# Patient Record
Sex: Male | Born: 1935 | Race: White | Hispanic: No | Smoking: Former smoker
Health system: Southern US, Community
[De-identification: ages and names within clinical notes are randomized; demographics above are authoritative.]

## PROBLEM LIST (undated history)

## (undated) DIAGNOSIS — I509 Heart failure, unspecified: Secondary | ICD-10-CM

## (undated) DIAGNOSIS — I4891 Unspecified atrial fibrillation: Secondary | ICD-10-CM

## (undated) DIAGNOSIS — I10 Essential (primary) hypertension: Secondary | ICD-10-CM

## (undated) DIAGNOSIS — R7303 Prediabetes: Secondary | ICD-10-CM

## (undated) DIAGNOSIS — I2510 Atherosclerotic heart disease of native coronary artery without angina pectoris: Secondary | ICD-10-CM

## (undated) DIAGNOSIS — R609 Edema, unspecified: Secondary | ICD-10-CM

## (undated) DIAGNOSIS — IMO0002 Reserved for concepts with insufficient information to code with codable children: Secondary | ICD-10-CM

## (undated) DIAGNOSIS — I4510 Unspecified right bundle-branch block: Secondary | ICD-10-CM

## (undated) HISTORY — DX: Essential (primary) hypertension: I10

## (undated) HISTORY — DX: Edema, unspecified: R60.9

## (undated) HISTORY — DX: Unspecified right bundle-branch block: I45.10

## (undated) HISTORY — PX: NEPHRECTOMY: SHX65

## (undated) HISTORY — DX: Unspecified atrial fibrillation: I48.91

## (undated) HISTORY — PX: CORONARY ARTERY BYPASS GRAFT: SHX141

## (undated) HISTORY — DX: Heart failure, unspecified: I50.9

## (undated) HISTORY — PX: MELANOMA EXCISION: SHX5266

## (undated) HISTORY — DX: Atherosclerotic heart disease of native coronary artery without angina pectoris: I25.10

## (undated) HISTORY — PX: HERNIA REPAIR: SHX51

## (undated) HISTORY — PX: SQUAMOUS CELL CARCINOMA EXCISION: SHX2433

## (undated) HISTORY — PX: APPENDECTOMY: SHX54

## (undated) HISTORY — PX: PROSTATE ABLATION: SHX6042

## (undated) HISTORY — DX: Reserved for concepts with insufficient information to code with codable children: IMO0002

---

## 2000-12-21 ENCOUNTER — Encounter (INDEPENDENT_AMBULATORY_CARE_PROVIDER_SITE_OTHER): Payer: Self-pay | Admitting: *Deleted

## 2000-12-21 ENCOUNTER — Other Ambulatory Visit: Admission: RE | Admit: 2000-12-21 | Discharge: 2000-12-21 | Payer: Self-pay | Admitting: Internal Medicine

## 2004-07-08 ENCOUNTER — Ambulatory Visit: Payer: Self-pay | Admitting: Urology

## 2005-02-08 ENCOUNTER — Ambulatory Visit: Payer: Self-pay

## 2005-03-08 ENCOUNTER — Ambulatory Visit: Payer: Self-pay | Admitting: Cardiology

## 2005-07-11 ENCOUNTER — Ambulatory Visit: Payer: Self-pay | Admitting: Urology

## 2006-04-19 ENCOUNTER — Ambulatory Visit: Payer: Self-pay | Admitting: Cardiology

## 2006-05-02 ENCOUNTER — Ambulatory Visit: Payer: Self-pay | Admitting: Internal Medicine

## 2006-06-05 LAB — HM COLONOSCOPY

## 2006-06-06 ENCOUNTER — Ambulatory Visit: Payer: Self-pay | Admitting: Internal Medicine

## 2006-10-04 ENCOUNTER — Ambulatory Visit: Payer: Self-pay

## 2007-04-17 ENCOUNTER — Ambulatory Visit: Payer: Self-pay | Admitting: Cardiology

## 2007-04-29 ENCOUNTER — Ambulatory Visit: Payer: Self-pay

## 2007-09-30 ENCOUNTER — Ambulatory Visit: Payer: Self-pay | Admitting: Urology

## 2008-04-21 ENCOUNTER — Ambulatory Visit: Payer: Self-pay | Admitting: Cardiology

## 2008-07-06 ENCOUNTER — Ambulatory Visit: Payer: Self-pay | Admitting: Family Medicine

## 2008-07-14 ENCOUNTER — Ambulatory Visit: Payer: Self-pay | Admitting: Family Medicine

## 2008-07-21 ENCOUNTER — Ambulatory Visit: Payer: Self-pay | Admitting: Cardiology

## 2008-07-21 LAB — CONVERTED CEMR LAB
Calcium: 9.6 mg/dL (ref 8.4–10.5)
Creatinine, Ser: 1.14 mg/dL (ref 0.40–1.50)
MCHC: 32.8 g/dL (ref 30.0–36.0)
MCV: 84.4 fL (ref 78.0–100.0)
Platelets: 190 10*3/uL (ref 150–400)
Pro B Natriuretic peptide (BNP): 288.2 pg/mL — ABNORMAL HIGH (ref 0.0–100.0)
RDW: 13.1 % (ref 11.5–15.5)
Sodium: 139 meq/L (ref 135–145)

## 2008-07-24 ENCOUNTER — Ambulatory Visit: Payer: Self-pay | Admitting: Cardiology

## 2008-07-28 ENCOUNTER — Encounter: Payer: Self-pay | Admitting: Cardiology

## 2008-07-28 ENCOUNTER — Ambulatory Visit: Payer: Self-pay | Admitting: Internal Medicine

## 2008-07-28 LAB — CONVERTED CEMR LAB: INR: 1.9 — ABNORMAL HIGH (ref 0.0–1.5)

## 2008-08-04 ENCOUNTER — Ambulatory Visit: Payer: Self-pay

## 2008-08-04 ENCOUNTER — Encounter: Payer: Self-pay | Admitting: Cardiology

## 2008-08-12 ENCOUNTER — Encounter: Payer: Self-pay | Admitting: Cardiology

## 2008-08-12 ENCOUNTER — Ambulatory Visit: Payer: Self-pay | Admitting: Internal Medicine

## 2008-08-12 LAB — CONVERTED CEMR LAB: Prothrombin Time: 45.9 s — ABNORMAL HIGH (ref 11.6–15.2)

## 2008-08-17 ENCOUNTER — Ambulatory Visit: Payer: Self-pay | Admitting: Family Medicine

## 2008-08-21 ENCOUNTER — Encounter: Payer: Self-pay | Admitting: Cardiology

## 2008-08-21 ENCOUNTER — Ambulatory Visit: Payer: Self-pay | Admitting: Internal Medicine

## 2008-08-27 ENCOUNTER — Ambulatory Visit: Payer: Self-pay | Admitting: Cardiology

## 2008-08-27 LAB — CONVERTED CEMR LAB: Prothrombin Time: 34.6 s — ABNORMAL HIGH (ref 11.6–15.2)

## 2008-09-03 ENCOUNTER — Ambulatory Visit: Payer: Self-pay | Admitting: Cardiology

## 2008-09-17 ENCOUNTER — Encounter: Payer: Self-pay | Admitting: Cardiology

## 2008-09-17 ENCOUNTER — Ambulatory Visit: Payer: Self-pay | Admitting: Internal Medicine

## 2008-09-17 LAB — CONVERTED CEMR LAB
INR: 2.8 — ABNORMAL HIGH (ref 0.0–1.5)
Prothrombin Time: 31.9 s — ABNORMAL HIGH (ref 11.6–15.2)

## 2008-09-21 ENCOUNTER — Ambulatory Visit: Payer: Self-pay | Admitting: Cardiology

## 2008-09-21 ENCOUNTER — Ambulatory Visit (HOSPITAL_COMMUNITY): Admission: RE | Admit: 2008-09-21 | Discharge: 2008-09-21 | Payer: Self-pay | Admitting: Cardiology

## 2008-09-21 HISTORY — PX: CARDIOVERSION: SHX1299

## 2008-09-29 ENCOUNTER — Ambulatory Visit: Payer: Self-pay | Admitting: Cardiology

## 2008-10-07 ENCOUNTER — Encounter: Payer: Self-pay | Admitting: Cardiology

## 2008-10-07 ENCOUNTER — Ambulatory Visit: Payer: Self-pay | Admitting: Cardiology

## 2008-10-07 LAB — CONVERTED CEMR LAB: Prothrombin Time: 32.5 s — ABNORMAL HIGH (ref 11.6–15.2)

## 2008-11-05 ENCOUNTER — Ambulatory Visit: Payer: Self-pay | Admitting: Cardiology

## 2008-11-10 ENCOUNTER — Ambulatory Visit: Payer: Self-pay | Admitting: Cardiology

## 2008-11-24 ENCOUNTER — Ambulatory Visit: Payer: Self-pay | Admitting: Cardiology

## 2008-12-01 ENCOUNTER — Ambulatory Visit: Payer: Self-pay | Admitting: Cardiology

## 2008-12-29 ENCOUNTER — Ambulatory Visit: Payer: Self-pay | Admitting: Cardiology

## 2008-12-29 DIAGNOSIS — R609 Edema, unspecified: Secondary | ICD-10-CM

## 2008-12-29 DIAGNOSIS — I4510 Unspecified right bundle-branch block: Secondary | ICD-10-CM

## 2009-01-12 ENCOUNTER — Ambulatory Visit: Payer: Self-pay | Admitting: Cardiology

## 2009-01-25 ENCOUNTER — Telehealth: Payer: Self-pay | Admitting: Cardiology

## 2009-02-02 ENCOUNTER — Ambulatory Visit: Payer: Self-pay | Admitting: Cardiology

## 2009-02-12 ENCOUNTER — Encounter: Payer: Self-pay | Admitting: Cardiology

## 2009-02-23 ENCOUNTER — Ambulatory Visit: Payer: Self-pay | Admitting: Cardiology

## 2009-03-09 ENCOUNTER — Ambulatory Visit: Payer: Self-pay | Admitting: Cardiology

## 2009-03-09 DIAGNOSIS — I10 Essential (primary) hypertension: Secondary | ICD-10-CM

## 2009-03-10 ENCOUNTER — Encounter: Payer: Self-pay | Admitting: Cardiology

## 2009-03-12 ENCOUNTER — Telehealth: Payer: Self-pay | Admitting: Cardiology

## 2009-03-22 ENCOUNTER — Encounter: Payer: Self-pay | Admitting: *Deleted

## 2009-03-23 ENCOUNTER — Ambulatory Visit: Payer: Self-pay | Admitting: Cardiology

## 2009-03-23 LAB — CONVERTED CEMR LAB: POC INR: 1.7

## 2009-04-06 ENCOUNTER — Ambulatory Visit: Payer: Self-pay | Admitting: Cardiology

## 2009-04-06 LAB — CONVERTED CEMR LAB: POC INR: 2.9

## 2009-04-21 ENCOUNTER — Ambulatory Visit: Payer: Self-pay | Admitting: Urology

## 2009-05-04 ENCOUNTER — Ambulatory Visit: Payer: Self-pay | Admitting: Cardiovascular Disease

## 2009-05-04 LAB — CONVERTED CEMR LAB: POC INR: 3.6

## 2009-05-25 ENCOUNTER — Ambulatory Visit: Payer: Self-pay | Admitting: Cardiovascular Disease

## 2009-06-15 ENCOUNTER — Ambulatory Visit: Payer: Self-pay | Admitting: Cardiovascular Disease

## 2009-06-15 LAB — CONVERTED CEMR LAB: POC INR: 2.7

## 2009-07-13 ENCOUNTER — Ambulatory Visit: Payer: Self-pay | Admitting: Cardiology

## 2009-07-13 LAB — CONVERTED CEMR LAB: POC INR: 2.6

## 2009-08-10 ENCOUNTER — Ambulatory Visit: Payer: Self-pay | Admitting: Cardiology

## 2009-09-07 ENCOUNTER — Ambulatory Visit: Payer: Self-pay | Admitting: Cardiovascular Disease

## 2009-09-07 LAB — CONVERTED CEMR LAB

## 2009-10-05 ENCOUNTER — Ambulatory Visit: Payer: Self-pay | Admitting: Cardiovascular Disease

## 2009-10-22 DIAGNOSIS — I25118 Atherosclerotic heart disease of native coronary artery with other forms of angina pectoris: Secondary | ICD-10-CM | POA: Insufficient documentation

## 2009-11-01 ENCOUNTER — Ambulatory Visit: Payer: Self-pay | Admitting: Cardiology

## 2009-11-01 DIAGNOSIS — R0602 Shortness of breath: Secondary | ICD-10-CM | POA: Insufficient documentation

## 2009-11-10 ENCOUNTER — Telehealth (INDEPENDENT_AMBULATORY_CARE_PROVIDER_SITE_OTHER): Payer: Self-pay | Admitting: *Deleted

## 2009-11-11 ENCOUNTER — Encounter (HOSPITAL_COMMUNITY): Admission: RE | Admit: 2009-11-11 | Discharge: 2010-01-05 | Payer: Self-pay | Admitting: Cardiology

## 2009-11-11 ENCOUNTER — Ambulatory Visit: Payer: Self-pay

## 2009-11-11 ENCOUNTER — Ambulatory Visit: Payer: Self-pay | Admitting: Cardiology

## 2009-11-17 ENCOUNTER — Telehealth: Payer: Self-pay | Admitting: Cardiology

## 2009-11-30 ENCOUNTER — Ambulatory Visit: Payer: Self-pay | Admitting: Internal Medicine

## 2009-11-30 LAB — CONVERTED CEMR LAB: POC INR: 2.4

## 2009-12-28 ENCOUNTER — Ambulatory Visit: Payer: Self-pay | Admitting: Cardiovascular Disease

## 2009-12-28 LAB — CONVERTED CEMR LAB: POC INR: 2.4

## 2010-01-25 ENCOUNTER — Ambulatory Visit: Payer: Self-pay | Admitting: Cardiovascular Disease

## 2010-01-25 LAB — CONVERTED CEMR LAB: POC INR: 2.9

## 2010-02-23 ENCOUNTER — Ambulatory Visit: Payer: Self-pay | Admitting: Cardiovascular Disease

## 2010-02-23 LAB — CONVERTED CEMR LAB: POC INR: 2.4

## 2010-03-30 ENCOUNTER — Ambulatory Visit: Payer: Self-pay | Admitting: Cardiovascular Disease

## 2010-04-28 ENCOUNTER — Ambulatory Visit: Payer: Self-pay | Admitting: Cardiology

## 2010-05-26 ENCOUNTER — Ambulatory Visit: Payer: Self-pay | Admitting: Cardiology

## 2010-05-26 LAB — CONVERTED CEMR LAB: POC INR: 2.3

## 2010-06-22 ENCOUNTER — Ambulatory Visit: Payer: Self-pay | Admitting: Cardiovascular Disease

## 2010-07-20 ENCOUNTER — Ambulatory Visit: Payer: Self-pay | Admitting: Cardiovascular Disease

## 2010-08-18 ENCOUNTER — Ambulatory Visit: Admission: RE | Admit: 2010-08-18 | Discharge: 2010-08-18 | Payer: Self-pay | Source: Home / Self Care

## 2010-08-18 LAB — CONVERTED CEMR LAB: POC INR: 2.6

## 2010-09-04 LAB — CONVERTED CEMR LAB
ALT: 23 units/L
BUN: 16 mg/dL
CO2: 23 meq/L
Calcium: 9 mg/dL
Cholesterol: 125 mg/dL
Creatinine, Ser: 1.22 mg/dL
HDL: 55 mg/dL

## 2010-09-06 ENCOUNTER — Encounter: Payer: Self-pay | Admitting: Cardiology

## 2010-09-06 NOTE — Medication Information (Signed)
Summary: rov/ewj  Anticoagulant Therapy  Managed by: Cloyde Reams, RN, BSN Referring MD: Valera Castle PCP: Julieanne Manson, MD Supervising MD: Mariah Milling Indication 1: Atrial Fibrillation (ICD-427.31) Lab Used: [facility] Site: [place] INR POC 2.8 INR RANGE 2.0-3.0  Dietary changes: yes       Details: Decr amt of vit K  Health status changes: no    Bleeding/hemorrhagic complications: no    Recent/future hospitalizations: no    Any changes in medication regimen? no    Recent/future dental: no  Any missed doses?: no       Is patient compliant with meds? yes       Allergies: No Known Drug Allergies  Anticoagulation Management History:      The patient is taking warfarin and comes in today for a routine follow up visit.  Positive risk factors for bleeding include an age of 75 years or older.  The bleeding index is 'intermediate risk'.  Positive CHADS2 values include History of HTN and Age > 68 years old.  The start date was 07/21/2008.  His last INR was 2.9.  Anticoagulation responsible Nakesha Ebrahim: [facility].  INR POC: 2.8.  Cuvette Lot#: [id number].  Exp: 05/2011.    Anticoagulation Management Assessment/Plan:      The patient's current anticoagulation dose is Warfarin sodium 5 mg tabs: Use as directed by Anticoagulation Clinic.  The target INR is 2 - 3.  The next INR is due 04/28/2010.  Anticoagulation instructions were given to patient.  Results were reviewed/authorized by Cloyde Reams, RN, BSN.  He was notified by Cloyde Reams RN.         Prior Anticoagulation Instructions: INR 2.4  Continue on same dosage 2.5mg  daily except 5mg  on Mondays and Fridays.  Recheck in 4 weeks.    Current Anticoagulation Instructions: INR 2.8  Continue on same dosage 1/2 tablet daily except 1 tablet on Mondays and Fridays.  Recheck in 4 weeks.   Appt with Dr Daleen Squibb on 04/28/10 at 3:30pm.

## 2010-09-06 NOTE — Medication Information (Signed)
Summary: CCR/AMD  Anticoagulant Therapy  Managed by: Cloyde Reams, RN, BSN Referring MD: Valera Castle PCP: Julieanne Manson, MD Supervising MD: Mariah Milling Indication 1: Atrial Fibrillation (ICD-427.31) Lab Used: [facility]--[place] Site:  INR POC 2.9 INR RANGE 2.0-3.0  Dietary changes: no    Health status changes: no    Bleeding/hemorrhagic complications: no    Recent/future hospitalizations: no    Any changes in medication regimen? no    Recent/future dental: no  Any missed doses?: no       Is patient compliant with meds? yes       Allergies: No Known Drug Allergies  Anticoagulation Management History:      The patient is taking warfarin and comes in today for a routine follow up visit.  Positive risk factors for bleeding include an age of 75 years or older.  The bleeding index is 'intermediate risk'.  Positive CHADS2 values include History of HTN and Age > 75 years old.  The start date was 07/21/2008.  His last INR was 2.9.  Anticoagulation responsible provider: Goll[facility].  INR POC: 2.9.  Cuvette Lot#: [id number].  Exp: 04/2011.    Anticoagulation Management Assessment/Plan:      The patient's current anticoagulation dose is Warfarin sodium 5 mg tabs: Use as directed by Anticoagulation Clinic.  The target INR is 2 - 3.  The next INR is due 02/22/2010.  Anticoagulation instructions were given to patient.  Results were reviewed/authorized by Cloyde Reams, RN, BSN.  He was notified by Cloyde Reams RN.         Prior Anticoagulation Instructions: INR 2.4  Continue on same dosage 2.5mg  daily except 5mg  on Mondays and Fridays.  Recheck in 4 weeks.    Current Anticoagulation Instructions: INR 2.9  Continue on same dosage 2.5mg  daily except 5mg  on Mondays and Fridays.  Recheck in 4 weeks.

## 2010-09-06 NOTE — Assessment & Plan Note (Signed)
Summary: Cardiology Nuclear Study  Nuclear Med Background Indications for Stress Test: Evaluation for Ischemia, Graft Patency   History: CABG, Cardioversion, Echo, Myocardial Perfusion Study  History Comments: '98 CABG x 3 9/08 MPS: EF=72%, NL, V-ectopy '09 Echo: EF=60%, mod. LVH 2/10 Cardioversion: A. Fib > S. Brady  Symptoms: DOE, Fatigue    Nuclear Pre-Procedure Cardiac Risk Factors: Family History - CAD, Hypertension, Lipids, RBBB Caffeine/Decaff Intake: None NPO After: 7:30 AM Lungs: clear IV 0.9% NS with Angio Cath: 18g     IV Site: (R) AC IV Started by: Stanton Kidney EMT-P Chest Size (in) 44     Height (in): 68 Weight (lb): 194 BMI: 29.60 Tech Comments: Metoprolol last taken at approx. 9:30 last evening, per Pt.  Nuclear Med Study 1 or 2 day study:  1 day     Stress Test Type:  Eugenie Birks Reading MD:  Marca Ancona, MD     Referring MD:  Valera Castle Resting Radionuclide:  Technetium 21m Tetrofosmin     Resting Radionuclide Dose:  11 mCi  Stress Radionuclide:  Technetium 55m Tetrofosmin     Stress Radionuclide Dose:  33 mCi   Stress Protocol   Lexiscan: 0.4 mg   Stress Test Technologist:  Milana Na EMT-P     Nuclear Technologist:  Domenic Polite CNMT  Rest Procedure  Myocardial perfusion imaging was performed at rest 45 minutes following the intravenous administration of Myoview Technetium 23m Tetrofosmin.  Stress Procedure  The patient received IV Lexiscan 0.4 mg over 15-seconds.  Myoview injected at 30-seconds.  There were no significant changes and rare pvcs with infusion.  Quantitative spect images were obtained after a 45 minute delay.  QPS Raw Data Images:  Normal; no motion artifact; normal heart/lung ratio. Stress Images:  Mild mid to apical anterior perfusion defect.  Rest Images:  Mild mid to apical anterior perfusion defect.  Subtraction (SDS):  Fixed mild mid to apical anterior perfusion defect.  Transient Ischemic Dilatation:  1.05  (Normal  <1.22)  Lung/Heart Ratio:  .35  (Normal <0.45)  Quantitative Gated Spect Images QGS EDV:  104 ml QGS ESV:  40 ml QGS EF:  62 % QGS cine images:  Normal wall motion   Overall Impression  Exercise Capacity: Lexiscan study.  BP Response: Normal blood pressure response. Clinical Symptoms: No chest pain ECG Impression: Baseline atrial fibrillation with RBBB.  Overall Impression: Fixed mild mid to apical anterior perfusion defect.  This may represent a small area of infarction.  There is no evidence for ischemia.  Low risk study.   Appended Document: Cardiology Nuclear Study EXCELLENT, NO CHANGE IN TREATMENT  Appended Document: Cardiology Nuclear Study LMTCB./CY  Appended Document: Cardiology Nuclear Study LMTCB./CY  Appended Document: Cardiology Nuclear Study Pt. aware of stress test results.

## 2010-09-06 NOTE — Assessment & Plan Note (Signed)
Summary: 6 mo f/u      Allergies Added: NKDA  Visit Type:  6 mo f/u Primary Provider:  Julieanne Manson, MD  CC:  no cardiac complaints today.  History of Present Illness: [name] comes in today for followup of his coronary artery disease, chronic atrial fibrillation, anticoagulation, and hypertension.  Other than some lower extremity edema he has had no complaints. He denies palpitations, dyspnea on exertion, problems with bleeding or melena, chest pain or angina.  He had a stress Myoview back in April which was stable. He has good LV function  Clinical Reports Reviewed:  Nuclear Study:  11/11/2009:  Excerise capacity: Lexiscan study  Blood Pressure response: Normal blood pressure response  Clinical symptoms: No chest pain  ECG impression: Baseline atrial fibrillation with RBBB  Overall impression: Fixed mild mid to apical anterior perfusion defect. This may represent a small area of infarction. There is no evidence for ischemia. Low risk study.  Marca Ancona, MD   04/29/2007:  Exercise capacity - Fair exercise capacity  Blood Pressure - hypertensive BP response  Clinical Symptoms - dyspnea  ECG impression - RBBB non-specific ST/T wave changes. Short burst of ? flutter or PAT  Overall impression -  Normal stress nuclear study  02/08/2005:  Walking on the treadmill, the patient had no chest pain. There were no diagnostic ST changes. There was some ventricular ectopy, as described, with the patient off his beta-blocker. The beta-blocker was given immediately afterwards. The nuclear imagesreveal no sign of significant scar or ischemia. There is good wall motion and a normal ejection fraction.   Current Medications (verified): 1)  Warfarin Sodium 5 Mg Tabs (Warfarin Sodium) .... Use As Directed By Anticoagulation Clinic 2)  Furosemide 40 Mg Tabs (Furosemide) .... Take One Tablet By Mouth Daily. 3)  Accupril 40 Mg Tabs (Quinapril Hcl) .Marland Kitchen.. 1 By Mouth Once Daily 4)   Metoprolol Succinate 100 Mg Xr24h-Tab (Metoprolol Succinate) .... Take One Tablet By Mouth Daily 5)  Glucosamine-Chondroitin   Caps (Glucosamine-Chondroit-Vit C-Mn) .... Once Daily 6)  Fish Oil   Oil (Fish Oil) .... 1000mg  Once Daily 7)  Aspirin 81 Mg Tbec (Aspirin) .... Take One Tablet By Mouth Daily 8)  Flomax 0.4 Mg Caps (Tamsulosin Hcl) .... Once Daily 9)  Amlodipine Besylate 5 Mg Tabs (Amlodipine Besylate) .... Take One Tablet By Mouth Daily 10)  Simvastatin 10 Mg Tabs (Simvastatin) .... Take One Tablet By Mouth Daily At Bedtime 11)  Multivitamins   Tabs (Multiple Vitamin) .... Once Daily 12)  Vitamin C 500 Mg  Tabs (Ascorbic Acid) .... Once Daily  Allergies (verified): No Known Drug Allergies  Past History:  Past Medical History: Last updated: 10/22/2009 ATRIAL FIBRILLATION (ICD-427.31) COUMADIN THERAPY (ICD-V58.61) CAD, NATIVE VESSEL (ICD-414.01) HYPERTENSION, BENIGN (ICD-401.1) RBBB (ICD-426.4) EDEMA (ICD-782.3)    Past Surgical History: Last updated: 10/22/2009 cardioversion..09/21/08 Appendectomy  Family History: Last updated: 10/22/2009 Negative for gastrointestinal malignancy  Social History: Last updated: 10/22/2009 The patient is married with 3 children.  He lives with his  wife.  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  He does not smoke.  He  enjoys a couple of alcoholic beverages per day.  Risk Factors: Smoking Status: never (12/29/2008)  Review of Systems       negative other than history of present illness  Vital Signs:  Patient profile:   77 year old male Height:      68 inches Weight:      197.4 pounds BMI:  30.12 Pulse rate:   70 / minute Pulse rhythm:   irregular BP sitting:   138 / 62  (left arm) Cuff size:   large  Vitals Entered By: Danielle Rankin, CMA (April 28, 2010 4:12 PM)  Physical Exam  General:  Well developed, well nourished, in no acute distress. Head:  normocephalic and atraumatic Eyes:  PERRLA/EOM intact;  conjunctiva and lids normal. Neck:  Neck supple, no JVD. No masses, thyromegaly or abnormal cervical nodes. Chest Wall:  no deformities or breast masses noted Lungs:  Clear bilaterally to auscultation and percussion. Heart:  PMI nondisplaced, regular rate and rhythm, verbal S1-S2, carotid upstrokes equal bilaterally without bruit Abdomen:  Bowel sounds positive; abdomen soft and non-tender without masses, organomegaly, or hernias noted. No hepatosplenomegaly. Msk:  decreased ROM.   Pulses:  pulses normal in all 4 extremities Extremities:  1+ left pedal edema and 1+ right pedal edema.  chronic edema skin change Neurologic:  Alert and oriented x 3. Skin:  Intact without lesions or rashes. Psych:  Normal affect.   EKG  Procedure date:  04/28/2010  Findings:      atrial fibrillation,right bundle branch block, no change.  Impression & Recommendations:  Problem # 1:  ATRIAL FIBRILLATION (ICD-427.31) Assessment Unchanged  His updated medication list for this problem includes:    Warfarin Sodium 5 Mg Tabs (Warfarin sodium) ..... Use as directed by anticoagulation clinic    Metoprolol Succinate 100 Mg Xr24h-tab (Metoprolol succinate) .Marland Kitchen... Take one tablet by mouth daily    Aspirin 81 Mg Tbec (Aspirin) .Marland Kitchen... Take one tablet by mouth daily  Problem # 2:  COUMADIN THERAPY (ICD-V58.61) Assessment: Unchanged  Problem # 3:  CAD, NATIVE VESSEL (ICD-414.01) Assessment: Unchanged  His updated medication list for this problem includes:    Warfarin Sodium 5 Mg Tabs (Warfarin sodium) ..... Use as directed by anticoagulation clinic    Accupril 40 Mg Tabs (Quinapril hcl) .Marland Kitchen... 1 by mouth once daily    Metoprolol Succinate 100 Mg Xr24h-tab (Metoprolol succinate) .Marland Kitchen... Take one tablet by mouth daily    Aspirin 81 Mg Tbec (Aspirin) .Marland Kitchen... Take one tablet by mouth daily    Amlodipine Besylate 5 Mg Tabs (Amlodipine besylate) .Marland Kitchen... Take one tablet by mouth daily  Problem # 4:  HYPERTENSION, BENIGN  (ICD-401.1) Assessment: Unchanged  His updated medication list for this problem includes:    Furosemide 40 Mg Tabs (Furosemide) .Marland Kitchen... Take one tablet by mouth daily.    Accupril 40 Mg Tabs (Quinapril hcl) .Marland Kitchen... 1 by mouth once daily    Metoprolol Succinate 100 Mg Xr24h-tab (Metoprolol succinate) .Marland Kitchen... Take one tablet by mouth daily    Aspirin 81 Mg Tbec (Aspirin) .Marland Kitchen... Take one tablet by mouth daily    Amlodipine Besylate 5 Mg Tabs (Amlodipine besylate) .Marland Kitchen... Take one tablet by mouth daily  Problem # 5:  RBBB (ICD-426.4) Assessment: Unchanged  His updated medication list for this problem includes:    Warfarin Sodium 5 Mg Tabs (Warfarin sodium) ..... Use as directed by anticoagulation clinic    Accupril 40 Mg Tabs (Quinapril hcl) .Marland Kitchen... 1 by mouth once daily    Metoprolol Succinate 100 Mg Xr24h-tab (Metoprolol succinate) .Marland Kitchen... Take one tablet by mouth daily    Aspirin 81 Mg Tbec (Aspirin) .Marland Kitchen... Take one tablet by mouth daily    Amlodipine Besylate 5 Mg Tabs (Amlodipine besylate) .Marland Kitchen... Take one tablet by mouth daily  Patient Instructions: 1)  Your physician wants you to follow-up in: April 2011  You will receive  a reminder letter in the mail two months in advance. If you don't receive a letter, please call our office to schedule the follow-up appointment. 2)  Your physician recommends that you continue on your current medications as directed. Please refer to the Current Medication list given to you today.

## 2010-09-06 NOTE — Medication Information (Signed)
Summary: rov/ewj   Anticoagulant Therapy  Managed by: Weston Brass, PharmD Referring MD: Valera Castle PCP: Julieanne Manson, MD Supervising MD: Shirlee Latch MD, Freida Busman Indication 1: Atrial Fibrillation (ICD-427.31) Lab Used: [facility]--[place] Site: [place] INR POC 2.3 INR RANGE 2.0-3.0  Dietary changes: no     Bleeding/hemorrhagic complications: no    Recent/future hospitalizations: no    Any changes in medication regimen? no     Any missed doses?: no       Is patient compliant with meds? yes       Allergies: No Known Drug Allergies  Anticoagulation Management History:      The patient is taking warfarin and comes in today for a routine follow up visit.  Positive risk factors for bleeding include an age of 75 years or older.  The bleeding index is 'intermediate risk'.  Positive CHADS2 values include History of HTN and Age > 59 years old.  The start date was 07/21/2008.  His last INR was 2.9.  Anticoagulation responsible provider: Shirlee Latch MD, [place].  INR POC: 2.3.  Cuvette Lot#: [id number].  Exp: 06/2011.    Anticoagulation Management Assessment/Plan:      The patient's current anticoagulation dose is Warfarin sodium 5 mg tabs: Use as directed by Anticoagulation Clinic.  The target INR is 2 - 3.  The next INR is due 06/22/2010.  Anticoagulation instructions were given to patient.  Results were reviewed/authorized by Weston Brass, PharmD.  He was notified by Benedict Needy, RN.         Prior Anticoagulation Instructions: INR 2.3  Continue taking one-half tablet every day except for one tablet on Monday and Friday.  We will see you in four weeks.   Current Anticoagulation Instructions: INR 2.3   Continue taking 1/2 tab daily except for 1 tab on Mondays and Fridays. Recheck in 4 weeks.

## 2010-09-06 NOTE — Medication Information (Signed)
Summary: CCR/AMD   Anticoagulant Therapy  Managed by: Cloyde Reams, RN, BSN Referring MD: Valera Castle PCP: Julieanne Manson, MD Supervising MD: Gala Romney MD, Reuel Boom Indication 1: Atrial Fibrillation (ICD-427.31) Lab Used: [facility]--[place] Site: [place] INR POC 2.4 INR RANGE 2.0-3.0  Dietary changes: no    Health status changes: no    Bleeding/hemorrhagic complications: no    Recent/future hospitalizations: no    Any changes in medication regimen? no    Recent/future dental: no  Any missed doses?: no       Is patient compliant with meds? yes       Allergies: No Known Drug Allergies  Anticoagulation Management History:      The patient is taking warfarin and comes in today for a routine follow up visit.  Positive risk factors for bleeding include an age of 18 years or older.  The bleeding index is 'intermediate risk'.  Positive CHADS2 values include History of HTN and Age > 7 years old.  The start date was 07/21/2008.  His last INR was 2.9.  Anticoagulation responsible provider: Dezyre Hoefer MD, Reuel Boom.  INR POC: 2.4.  Exp: 12/2010.    Anticoagulation Management Assessment/Plan:      The patient's current anticoagulation dose is Warfarin sodium 5 mg tabs: Use as directed by Anticoagulation Clinic.  The target INR is 2 - 3.  The next INR is due 12/28/2009.  Anticoagulation instructions were given to patient.  Results were reviewed/authorized by Cloyde Reams, RN, BSN.  He was notified by Charlena Cross, RN, BSN.         Prior Anticoagulation Instructions: INR 2.5  Continue on same dosage 2.5mg  daily except 5mg  on Mondays and Fridays.  Recheck in 4 weeks.   Current Anticoagulation Instructions: The patient is to continue with the same dose of coumadin.  This dosage includes: coumadin 2.5 mg daily with 5 mg on M and F

## 2010-09-06 NOTE — Medication Information (Signed)
Summary: CCR  Anticoagulant Therapy  Managed by: Cloyde Reams, RN, BSN Referring MD: Valera Castle PCP: Julieanne Manson, MD Supervising MD: Mariah Milling Indication 1: Atrial Fibrillation (ICD-427.31) Lab Used: [facility] Site: [place] INR POC 2.4 INR RANGE 2.0-3.0  Dietary changes: yes       Details: May have eaten less vit K.  Health status changes: no    Bleeding/hemorrhagic complications: no    Recent/future hospitalizations: no    Any changes in medication regimen? no    Recent/future dental: no  Any missed doses?: no       Is patient compliant with meds? yes       Allergies: No Known Drug Allergies  Anticoagulation Management History:      The patient is taking warfarin and comes in today for a routine follow up visit.  Positive risk factors for bleeding include an age of 75 years or older.  The bleeding index is 'intermediate risk'.  Positive CHADS2 values include History of HTN and Age > 15 years old.  The start date was 07/21/2008.  His last INR was 2.9.  Anticoagulation responsible provider: Jacqulene Huntley.  INR POC: 2.4.  Cuvette Lot#: [id number].  Exp: 03/2011.    Anticoagulation Management Assessment/Plan:      The patient's current anticoagulation dose is Warfarin sodium 5 mg tabs: Use as directed by Anticoagulation Clinic.  The target INR is 2 - 3.  The next INR is due 03/30/2010.  Anticoagulation instructions were given to patient.  Results were reviewed/authorized by Cloyde Reams, RN, BSN.  He was notified by Cloyde Reams RN.         Prior Anticoagulation Instructions: INR 2.9  Continue on same dosage 2.5mg  daily except 5mg  on Mondays and Fridays.  Recheck in 4 weeks.    Current Anticoagulation Instructions: INR 2.4  Continue on same dosage 2.5mg  daily except 5mg  on Mondays and Fridays.  Recheck in 4 weeks.

## 2010-09-06 NOTE — Medication Information (Signed)
Summary: CCR/AMD  Anticoagulant Therapy  Managed by: Cloyde Reams, RN, BSN Referring MD: Valera Castle PCP: Julieanne Manson, MD Supervising MD: Mariah Milling Indication 1: Atrial Fibrillation (ICD-427.31) Lab Used: [facility]--[place] Site: [place] INR POC 2.4 INR RANGE 2.0-3.0    Bleeding/hemorrhagic complications: no     Any changes in medication regimen? no     Any missed doses?: no       Is patient compliant with meds? yes       Allergies: No Known Drug Allergies  Anticoagulation Management History:      The patient is taking warfarin and comes in today for a routine follow up visit.  Positive risk factors for bleeding include an age of 46 years or older.  The bleeding index is 'intermediate risk'.  Positive CHADS2 values include History of HTN and Age > 1 years old.  The start date was 07/21/2008.  His last INR was 2.9.  Anticoagulation responsible provider: [facility].  INR POC: 2.4.  Cuvette Lot#: [id number].  Exp: 03/2011.    Anticoagulation Management Assessment/Plan:      The patient's current anticoagulation dose is Warfarin sodium 5 mg tabs: Use as directed by Anticoagulation Clinic.  The target INR is 2 - 3.  The next INR is due 01/25/2010.  Anticoagulation instructions were given to patient.  Results were reviewed/authorized by Cloyde Reams, RN, BSN.  He was notified by Cloyde Reams RN.         Prior Anticoagulation Instructions: The patient is to continue with the same dose of coumadin.  This dosage includes: coumadin 2.5 mg daily with 5 mg on M and F  Current Anticoagulation Instructions: INR 2.4  Continue on same dosage 2.5mg  daily except 5mg  on Mondays and Fridays.  Recheck in 4 weeks.

## 2010-09-06 NOTE — Assessment & Plan Note (Signed)
Summary: F6M/AMD      Allergies Added: NKDA  Visit Type:  6 mos f/u Primary Provider:  Julieanne Manson, MD  CC:  shortness of breath.  History of Present Illness: [name] comes in today for evaluation and management of his history of coronary artery disease, history of bypass surgery in the mid 90s, chronic atrial fibrillation, lower extremity edema, hypertension, anticoagulation, and hyperlipidemia.  He still followed by Dr. Sullivan Lone. Recent blood work was reviewed and showed a normal CBC, blood sugar 111, creatinine 1.2, potassium 4.1, normal LFTs, total cholesterol of 125, triglycerides of 46, HDL 55, LDL 61, normal TSH.  He has chronic fatigue but no angina. He does have some dyspnea on exertion. His lower skin edema is better with Lasix and restricting salt.  He denies orthopnea, palpitations, presyncope or syncope. He's had no orthostatic symptoms.  His last echocardiogram was in December of 09. Head normal left ventricular function, moderate LVH, diastolic dysfunction, postoperative septal dyskinesia, mild right ventricular dilatation with normal function, and mild to moderate pulmonary hypertension.  Clinical Reports Reviewed:  Nuclear Study:  04/29/2007:  Exercise capacity - Fair exercise capacity  Blood Pressure - hypertensive BP response  Clinical Symptoms - dyspnea  ECG impression - RBBB non-specific ST/T wave changes. Short burst of ? flutter or PAT  Overall impression -  Normal stress nuclear study  02/08/2005:  Walking on the treadmill, the patient had no chest pain. There were no diagnostic ST changes. There was some ventricular ectopy, as described, with the patient off his beta-blocker. The beta-blocker was given immediately afterwards. The nuclear imagesreveal no sign of significant scar or ischemia. There is good Marcella Dunnaway motion and a normal ejection fraction.   Current Medications (verified): 1)  Warfarin Sodium 5 Mg Tabs (Warfarin Sodium) .... Use As Directed  By Anticoagulation Clinic 2)  Furosemide 40 Mg Tabs (Furosemide) .... Take One Tablet By Mouth Daily. 3)  Accupril 40 Mg Tabs (Quinapril Hcl) .Marland Kitchen.. 1 By Mouth Once Daily 4)  Metoprolol Succinate 100 Mg Xr24h-Tab (Metoprolol Succinate) .... Take One Tablet By Mouth Daily 5)  Glucosamine-Chondroitin   Caps (Glucosamine-Chondroit-Vit C-Mn) .... Once Daily 6)  Fish Oil   Oil (Fish Oil) .... 1000mg  Once Daily 7)  Aspirin 81 Mg Tbec (Aspirin) .... Take One Tablet By Mouth Daily 8)  Flomax 0.4 Mg Caps (Tamsulosin Hcl) .... Once Daily 9)  Amlodipine Besylate 5 Mg Tabs (Amlodipine Besylate) .... Take One Tablet By Mouth Daily 10)  Simvastatin 10 Mg Tabs (Simvastatin) .... Take One Tablet By Mouth Daily At Bedtime 11)  Multivitamins   Tabs (Multiple Vitamin) .... Once Daily 12)  Vitamin C 500 Mg  Tabs (Ascorbic Acid) .... Once Daily  Allergies (verified): No Known Drug Allergies  Past History:  Past Medical History: Last updated: 10/22/2009 ATRIAL FIBRILLATION (ICD-427.31) COUMADIN THERAPY (ICD-V58.61) CAD, NATIVE VESSEL (ICD-414.01) HYPERTENSION, BENIGN (ICD-401.1) RBBB (ICD-426.4) EDEMA (ICD-782.3)    Past Surgical History: Last updated: 10/22/2009 cardioversion..09/21/08 Appendectomy  Family History: Last updated: 10/22/2009 Negative for gastrointestinal malignancy  Social History: Last updated: 10/22/2009 The patient is married with 3 children.  He lives with his  wife.  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  He does not smoke.  He  enjoys a couple of alcoholic beverages per day.  Risk Factors: Smoking Status: never (12/29/2008)  Review of Systems       negative other history of present illness  Vital Signs:  Patient profile:   77 year old male Height:  68 inches Weight:      194 pounds Pulse rate:   61 / minute BP sitting:   149 / 65  (left arm) Cuff size:   large  Vitals Entered By: Oswald Hillock (November 01, 2009 3:00 PM)  Physical Exam  General:   overweight Head:  normocephalic and atraumatic Eyes:  PERRLA/EOM intact; conjunctiva and lids normal. Neck:  Neck supple, no JVD. No masses, thyromegaly or abnormal cervical nodes. Chest Tymar Polyak:  no deformities or breast masses noted Lungs:  Clear bilaterally to auscultation and percussion. Heart:  no obvious right ventricular lift, irregular rate and rhythm, variable S1 and S2, no gallop Abdomen:  Bowel sounds positive; abdomen soft and non-tender without masses, organomegaly, or hernias noted. No hepatosplenomegaly. Msk:  decreased ROM.  decreased ROM.   Pulses:  pulses normal in all 4 extremities Extremities:  trace left pedal edema and trace right pedal edema.  trace left pedal edema.   Neurologic:  Alert and oriented x 3. Skin:  Intact without lesions or rashes. Psych:  Normal affect.   Impression & Recommendations:  Problem # 1:  CAD, NATIVE VESSEL (ICD-414.01) With dyspnea on exertion, and 42 -year-old grafts, we'll perform a stress Myoview. His updated medication list for this problem includes:    Warfarin Sodium 5 Mg Tabs (Warfarin sodium) ..... Use as directed by anticoagulation clinic    Accupril 40 Mg Tabs (Quinapril hcl) .Marland Kitchen... 1 by mouth once daily    Metoprolol Succinate 100 Mg Xr24h-tab (Metoprolol succinate) .Marland Kitchen... Take one tablet by mouth daily    Aspirin 81 Mg Tbec (Aspirin) .Marland Kitchen... Take one tablet by mouth daily    Amlodipine Besylate 5 Mg Tabs (Amlodipine besylate) .Marland Kitchen... Take one tablet by mouth daily  Problem # 2:  ATRIAL FIBRILLATION (ICD-427.31) Assessment: Unchanged I have offered him amiodarone with a chance at cardioversion again. He declines and says that it doesn't bother him that much. His updated medication list for this problem includes:    Warfarin Sodium 5 Mg Tabs (Warfarin sodium) ..... Use as directed by anticoagulation clinic    Metoprolol Succinate 100 Mg Xr24h-tab (Metoprolol succinate) .Marland Kitchen... Take one tablet by mouth daily    Aspirin 81 Mg Tbec  (Aspirin) .Marland Kitchen... Take one tablet by mouth daily  Problem # 3:  COUMADIN THERAPY (ICD-V58.61) Assessment: Unchanged  Problem # 4:  RBBB (ICD-426.4) Assessment: Unchanged  His updated medication list for this problem includes:    Warfarin Sodium 5 Mg Tabs (Warfarin sodium) ..... Use as directed by anticoagulation clinic    Accupril 40 Mg Tabs (Quinapril hcl) .Marland Kitchen... 1 by mouth once daily    Metoprolol Succinate 100 Mg Xr24h-tab (Metoprolol succinate) .Marland Kitchen... Take one tablet by mouth daily    Aspirin 81 Mg Tbec (Aspirin) .Marland Kitchen... Take one tablet by mouth daily    Amlodipine Besylate 5 Mg Tabs (Amlodipine besylate) .Marland Kitchen... Take one tablet by mouth daily  Problem # 5:  EDEMA (ICD-782.3) Assessment: Improved  Other Orders: Nuclear Stress Test (Nuc Stress Test)  Patient Instructions: 1)  Your physician recommends that you schedule a follow-up appointment in: 6 MONTHS WITH DR Judianne Seiple 2)  Your physician recommends that you continue on your current medications as directed. Please refer to the Current Medication list given to you today. 3)  Your physician has requested that you have an exercise stress myoview.  For further information please visit [email or web address]  Please follow instruction sheet, as given.

## 2010-09-06 NOTE — Medication Information (Signed)
Summary: rov/seeing Dr Daleen Squibb at 3:30pm/ewj  Anticoagulant Therapy  Managed by: Weston Brass, PharmD Referring MD: Valera Castle PCP: Julieanne Manson, MD Supervising MD: Shirlee Latch MD, Freida Busman Indication 1: Atrial Fibrillation (ICD-427.31) Lab Used: [facility]  Site: [place] INR POC 2.3 INR RANGE 2.0-3.0  Dietary changes: no    Health status changes: no    Bleeding/hemorrhagic complications: no    Recent/future hospitalizations: no    Any changes in medication regimen? no    Recent/future dental: no  Any missed doses?: no       Is patient compliant with meds? yes       Allergies: No Known Drug Allergies  Anticoagulation Management History:      The patient is taking warfarin and comes in today for a routine follow up visit.  Positive risk factors for bleeding include an age of 75 years or older.  The bleeding index is 'intermediate risk'.  Positive CHADS2 values include History of HTN and Age > 75 years old.  The start date was 07/21/2008.  His last INR was 2.9.  Anticoagulation responsible provider: Shirlee Latch MD, Ralphael Southgate.  INR POC: 2.3.  Cuvette Lot#: [id number].  Exp: 06/2011.    Anticoagulation Management Assessment/Plan:      The patient's current anticoagulation dose is Warfarin sodium 5 mg tabs: Use as directed by Anticoagulation Clinic.  The target INR is 2 - 3.  The next INR is due 05/26/2010.  Anticoagulation instructions were given to patient.  Results were reviewed/authorized by Weston Brass, PharmD.  He was notified by Kennieth Francois.         Prior Anticoagulation Instructions: INR 2.8  Continue on same dosage 1/2 tablet daily except 1 tablet on Mondays and Fridays.  Recheck in 4 weeks.   Appt with Dr Daleen Squibb on 04/28/10 at 3:30pm.  Current Anticoagulation Instructions: INR 2.3  Continue taking one-half tablet every day except for one tablet on Monday and Friday.  We will see you in four weeks.

## 2010-09-06 NOTE — Progress Notes (Signed)
Summary: returning call   Phone Note Call from Patient Call back at Home Phone [phone]   Caller: Patient Summary of Call: returning call Initial call taken by: Migdalia Dk,  November 17, 2009 2:17 PM  Follow-up for Phone Call        Spoke with pt. Stress test results given. Follow-up by: Ollen Gross, RN, BSN,  November 17, 2009 2:30 PM

## 2010-09-06 NOTE — Medication Information (Signed)
Summary: CCR/AMD  Anticoagulant Therapy  Managed by: Cloyde Reams, RN, BSN Referring MD: Valera Castle PCP: Julieanne Manson Supervising MD: Clifton James MD, Cristal Deer Indication 1: Atrial Fibrillation (ICD-427.31) Lab Used: [facility] Site: [place] INR POC 2.5 INR RANGE 2.0-3.0  Dietary changes: no    Health status changes: no    Bleeding/hemorrhagic complications: no    Recent/future hospitalizations: no    Any changes in medication regimen? no    Recent/future dental: no  Any missed doses?: no       Is patient compliant with meds? yes       Allergies (verified): No Known Drug Allergies  Anticoagulation Management History:      The patient is taking warfarin and comes in today for a routine follow up visit.  Positive risk factors for bleeding include an age of 75 years or older.  The bleeding index is 'intermediate risk'.  Positive CHADS2 values include History of HTN and Age > 54 years old.  The start date was 07/21/2008.  His last INR was 2.9.  Anticoagulation responsible provider: Clifton James MD, Cristal Deer.  INR POC: 2.5.  Cuvette Lot#: [id number].  Exp: 12/2010.    Anticoagulation Management Assessment/Plan:      The patient's current anticoagulation dose is Warfarin sodium 5 mg tabs: Use as directed by Anticoagulation Clinic.  The target INR is 2 - 3.  The next INR is due 11/29/2009.  Anticoagulation instructions were given to patient.  Results were reviewed/authorized by Cloyde Reams, RN, BSN.  He was notified by Cloyde Reams RN.         Prior Anticoagulation Instructions: The patient is to continue with the same dose of coumadin.  This dosage includes: coumadin 2.5 mg daily with 5 mg on Monday and Friday  Current Anticoagulation Instructions: INR 2.5  Continue on same dosage 2.5mg  daily except 5mg  on Mondays and Fridays.  Recheck in 4 weeks.

## 2010-09-06 NOTE — Progress Notes (Signed)
Summary: Nuclear Pre-Procedure  Phone Note Outgoing Call Call back at [facility] Phone [phone]   Call placed by: Stanton Kidney, EMT-P,  November 10, 2009 1:40 PM Action Taken: Phone Call Completed Summary of Call: Reviewed information on Myoview Information Sheet (see scanned document for further details).  Spoke with Patients wife.    Nuclear Med Background Indications for Stress Test: Evaluation for Ischemia, Graft Patency   History: CABG, Cardioversion, Echo, Myocardial Perfusion Study  History Comments: '98 CABG x 3 9/08 MPS: EF=72%, NL, V-ectopy '09 Echo: EF=60%, mod. LVH 2/10 Cardioversion: A. Fib > [place] F[place]  Symptoms: DOE, Fatigue    Nuclear Pre-Procedure Cardiac Risk Factors: Family History - CAD, Hypertension, Lipids, RBBB Height (in): 68  Nuclear Med Study Referring MD:  Valera Castle

## 2010-09-06 NOTE — Medication Information (Signed)
Summary: CCR/AMD  Anticoagulant Therapy  Managed by: Bethena Midget, RN, BSN Referring MD: Valera Castle PCP: Julieanne Manson, MD Supervising MD: Mariah Milling MD, Tim Indication 1: Atrial Fibrillation (ICD-427.31) Lab Used: [facility] Site: [place] INR POC 2.8 INR RANGE 2.0-3.0  Dietary changes: no    Health status changes: no    Bleeding/hemorrhagic complications: no    Recent/future hospitalizations: no    Any changes in medication regimen? no    Recent/future dental: no  Any missed doses?: no       Is patient compliant with meds? yes       Allergies: No Known Drug Allergies  Anticoagulation Management History:      The patient is taking warfarin and comes in today for a routine follow up visit.  Positive risk factors for bleeding include an age of 35 years or older.  The bleeding index is 'intermediate risk'.  Positive CHADS2 values include History of HTN and Age > 26 years old.  The start date was 07/21/2008.  His last INR was 2.9.  Anticoagulation responsible provider: Mariah Milling MD, Tim.  INR POC: 2.8.  Cuvette Lot#: [id number].  Exp: 07/2011.    Anticoagulation Management Assessment/Plan:      The patient's current anticoagulation dose is Warfarin sodium 5 mg tabs: Use as directed by Anticoagulation Clinic.  The target INR is 2 - 3.  The next INR is due 07/20/2010.  Anticoagulation instructions were given to patient.  Results were reviewed/authorized by Bethena Midget, RN, BSN.  He was notified by Bethena Midget, RN, BSN.         Prior Anticoagulation Instructions: INR 2.3   Continue taking 1/2 tab daily except for 1 tab on Mondays and Fridays. Recheck in 4 weeks.   Current Anticoagulation Instructions: INR 2.8 Continue 2.5mg  everyday except 5mg s on Mondays and Fridays. Recheck in 4 weeks.

## 2010-09-06 NOTE — Medication Information (Signed)
Summary: CCR/AMD   Anticoagulant Therapy  Managed by: Robyn Haber, RN, BSN Referring MD: Valera Castle PCP: Julieanne Manson Supervising MD: Mariah Milling Indication 1: Atrial Fibrillation (ICD-427.31) Lab Used: [facility] Site: [place] INR POC 2.4 INR RANGE 2.0-3.0  Dietary changes: no    Health status changes: no    Bleeding/hemorrhagic complications: no    Recent/future hospitalizations: no    Any changes in medication regimen? no    Recent/future dental: no  Any missed doses?: no       Is patient compliant with meds? yes       Allergies: No Known Drug Allergies  Anticoagulation Management History:      The patient is taking warfarin and comes in today for a routine follow up visit.  Positive risk factors for bleeding include an age of 75 years or older.  The bleeding index is 'intermediate risk'.  Positive CHADS2 values include History of HTN and Age > 4 years old.  The start date was 07/21/2008.  His last INR was 2.9.  Anticoagulation responsible provider: Seif Teichert.  INR POC: 2.4.    Anticoagulation Management Assessment/Plan:      The patient's current anticoagulation dose is Warfarin sodium 5 mg tabs: Use as directed by Anticoagulation Clinic.  The target INR is 2 - 3.  The next INR is due 10/05/2009.  Anticoagulation instructions were given to patient.  Results were reviewed/authorized by Robyn Haber, RN, BSN.  He was notified by Charlena Cross, RN, BSN.         Prior Anticoagulation Instructions: The patient is to continue with the same dose of coumadin.  This dosage includes:  coumadin 2.5 mg daily with 5 mg on mon and fri  Current Anticoagulation Instructions: The patient is to continue with the same dose of coumadin.  This dosage includes: coumadin 2.5 mg daily with 5 mg on monday and friday

## 2010-09-06 NOTE — Medication Information (Signed)
Summary: CCR/AMD   Anticoagulant Therapy  Managed by: Robyn Haber, RN, BSN Referring MD: Valera Castle PCP: Julieanne Manson Supervising MD: Mariah Milling Indication 1: Atrial Fibrillation (ICD-427.31) Lab Used: [facility] Site: [place] INR POC 2.7 INR RANGE 2.0-3.0  Dietary changes: no    Health status changes: no    Bleeding/hemorrhagic complications: no    Recent/future hospitalizations: no    Any changes in medication regimen? no    Recent/future dental: no  Any missed doses?: no       Is patient compliant with meds? yes       Allergies: No Known Drug Allergies  Anticoagulation Management History:      The patient is taking warfarin and comes in today for a routine follow up visit.  Positive risk factors for bleeding include an age of 75 years or older.  The bleeding index is 'intermediate risk'.  Positive CHADS2 values include History of HTN and Age > 75 years old.  The start date was 07/21/2008.  His last INR was 2.9.  Anticoagulation responsible provider: Emon Lance.  INR POC: 2.7.    Anticoagulation Management Assessment/Plan:      The patient's current anticoagulation dose is Warfarin sodium 5 mg tabs: Use as directed by Anticoagulation Clinic.  The target INR is 2 - 3.  The next INR is due 11/02/2009.  Anticoagulation instructions were given to patient.  Results were reviewed/authorized by Robyn Haber, RN, BSN.  He was notified by Charlena Cross, RN, BSN.         Prior Anticoagulation Instructions: The patient is to continue with the same dose of coumadin.  This dosage includes: coumadin 2.5 mg daily with 5 mg on monday and friday  Current Anticoagulation Instructions: The patient is to continue with the same dose of coumadin.  This dosage includes: coumadin 2.5 mg daily with 5 mg on Monday and Friday

## 2010-09-06 NOTE — Medication Information (Signed)
Summary: CCR/AMD   Anticoagulant Therapy  Managed by: Robyn Haber, RN, BSN Referring MD: Valera Castle PCP: Julieanne Manson Supervising MD: Daleen Squibb MD, Maisie Fus Indication 1: Atrial Fibrillation (ICD-427.31) Lab Used: [facility] [place] Site: [place] INR POC 2.6  Dietary changes: no    Health status changes: no    Bleeding/hemorrhagic complications: no    Recent/future hospitalizations: no    Any changes in medication regimen? no    Recent/future dental: no  Any missed doses?: no       Is patient compliant with meds? yes       Allergies: No Known Drug Allergies  Anticoagulation Management History:      The patient is taking warfarin and comes in today for a routine follow up visit.  Positive risk factors for bleeding include an age of 32 years or older.  The bleeding index is 'intermediate risk'.  Positive CHADS2 values include History of HTN and Age > 70 years old.  The start date was 07/21/2008.  His last INR was 2.9.  Anticoagulation responsible provider: Daleen Squibb MD, Maisie Fus.  INR POC: 2.6.    Anticoagulation Management Assessment/Plan:      The patient's current anticoagulation dose is Warfarin sodium 5 mg tabs: Use as directed by Anticoagulation Clinic.  The target INR is 2 - 3.  The next INR is due 09/07/2009.  Anticoagulation instructions were given to patient.  Results were reviewed/authorized by Robyn Haber, RN, BSN.  He was notified by Charlena Cross, RN, BSN.         Prior Anticoagulation Instructions: The patient is to continue with the same dose of coumadin.  This dosage includes: coumadin 2.5 mg daily with 5 mg on Mon and Fri  Current Anticoagulation Instructions: The patient is to continue with the same dose of coumadin.  This dosage includes:  coumadin 2.5 mg daily with 5 mg on mon and fri

## 2010-09-08 NOTE — Medication Information (Signed)
Summary: rov/ewj   Anticoagulant Therapy  Managed by: Lanny Hurst, RN Referring MD: Valera Castle PCP: Julieanne Manson, MD Supervising MD: Mariah Milling MD, Tim Indication 1: Atrial Fibrillation (ICD-427.31) Lab Used: [facility] Site: [place] INR POC 2.6 INR RANGE 2.0-3.0  Dietary changes: no    Health status changes: no    Bleeding/hemorrhagic complications: no    Recent/future hospitalizations: no    Any changes in medication regimen? no    Recent/future dental: no  Any missed doses?: no       Is patient compliant with meds? yes       Allergies: No Known Drug Allergies  Anticoagulation Management History:      The patient is taking warfarin and comes in today for a routine follow up visit.  Positive risk factors for bleeding include an age of 75 years or older.  The bleeding index is 'intermediate risk'.  Positive CHADS2 values include History of HTN and Age > 31 years old.  The start date was 07/21/2008.  His last INR was 2.9.  Anticoagulation responsible provider: Mariah Milling MD, Tim.  INR POC: 2.6.  Exp: 07/2011.    Anticoagulation Management Assessment/Plan:      The patient's current anticoagulation dose is Warfarin sodium 5 mg tabs: Use as directed by Anticoagulation Clinic.  The target INR is 2 - 3.  The next INR is due 09/15/2010.  Anticoagulation instructions were given to patient.  Results were reviewed/authorized by Lanny Hurst, RN.  He was notified by Lanny Hurst RN.         Prior Anticoagulation Instructions: INR 2.6  Continue on same dosage 2.5mg  daily except 5mg  on Mondays and Fridays.  Recheck in 4 weeks.   Current Anticoagulation Instructions: INR 2.6  Continue same dosage 1/2 tablet every day except 1 tablet on Mondays and Fridays. Recheck in 4 weeks.

## 2010-09-08 NOTE — Medication Information (Signed)
Summary: rov/tm  Anticoagulant Therapy  Managed by: Cloyde Reams, RN, BSN Referring MD: Valera Castle PCP: Julieanne Manson, MD Supervising MD: Mariah Milling MD, Tim Indication 1: Atrial Fibrillation (ICD-427.31) Lab Used: [facility] Site: [place] INR POC 2.6 INR RANGE 2.0-3.0  Dietary changes: no    Health status changes: no    Bleeding/hemorrhagic complications: no    Recent/future hospitalizations: no    Any changes in medication regimen? no    Recent/future dental: no  Any missed doses?: no       Is patient compliant with meds? yes       Allergies: No Known Drug Allergies  Anticoagulation Management History:      The patient is taking warfarin and comes in today for a routine follow up visit.  Positive risk factors for bleeding include an age of 75 years or older.  The bleeding index is 'intermediate risk'.  Positive CHADS2 values include History of HTN and Age > 60 years old.  The start date was 07/21/2008.  His last INR was 2.9.  Anticoagulation responsible provider: Mariah Milling MD, Tim.  INR POC: 2.6.  Cuvette Lot#: [id number].  Exp: 07/2011.    Anticoagulation Management Assessment/Plan:      The patient's current anticoagulation dose is Warfarin sodium 5 mg tabs: Use as directed by Anticoagulation Clinic.  The target INR is 2 - 3.  The next INR is due 08/17/2010.  Anticoagulation instructions were given to patient.  Results were reviewed/authorized by Cloyde Reams, RN, BSN.  He was notified by Cloyde Reams RN.         Prior Anticoagulation Instructions: INR 2.8 Continue 2.5mg  everyday except 5mg s on Mondays and Fridays. Recheck in 4 weeks.   Current Anticoagulation Instructions: INR 2.6  Continue on same dosage 2.5mg  daily except 5mg  on Mondays and Fridays.  Recheck in 4 weeks.

## 2010-09-15 ENCOUNTER — Other Ambulatory Visit (INDEPENDENT_AMBULATORY_CARE_PROVIDER_SITE_OTHER): Payer: Medicare Other

## 2010-09-15 ENCOUNTER — Encounter: Payer: Self-pay | Admitting: Cardiovascular Disease

## 2010-09-15 DIAGNOSIS — Z7901 Long term (current) use of anticoagulants: Secondary | ICD-10-CM

## 2010-09-15 DIAGNOSIS — I4891 Unspecified atrial fibrillation: Secondary | ICD-10-CM

## 2010-09-15 LAB — CONVERTED CEMR LAB: POC INR: 2.6

## 2010-09-22 NOTE — Medication Information (Signed)
Summary: [facility]madin Clinic   Anticoagulant Therapy  Managed by: Lanny Hurst, RN Referring MD: Valera Castle PCP: Julieanne Manson, MD Supervising MD: Mariah Milling MD, Tim Indication 1: Atrial Fibrillation (ICD-427.31) Lab Used: [facility] Site: [place] INR POC 2.6 INR RANGE 2.0-3.0  Dietary changes: no    Health status changes: no    Bleeding/hemorrhagic complications: no    Recent/future hospitalizations: no    Any changes in medication regimen? no    Recent/future dental: no  Any missed doses?: no       Is patient compliant with meds? yes       Allergies: No Known Drug Allergies  Anticoagulation Management History:      The patient is taking warfarin and comes in today for a routine follow up visit.  Positive risk factors for bleeding include an age of 76 years or older.  The bleeding index is 'intermediate risk'.  Positive CHADS2 values include History of HTN and Age > 33 years old.  The start date was 07/21/2008.  His last INR was 2.9.  Anticoagulation responsible provider: Mariah Milling MD, Tim.  INR POC: 2.6.  Exp: 07/2011.    Anticoagulation Management Assessment/Plan:      The patient's current anticoagulation dose is Warfarin sodium 5 mg tabs: Use as directed by Anticoagulation Clinic.  The target INR is 2 - 3.  The next INR is due 10/12/2010.  Anticoagulation instructions were given to patient.  Results were reviewed/authorized by Lanny Hurst, RN.         Prior Anticoagulation Instructions: INR 2.6  Continue same dosage 1/2 tablet every day except 1 tablet on Mondays and Fridays. Recheck in 4 weeks.  Current Anticoagulation Instructions: Same as Prior Instructions.

## 2010-10-12 ENCOUNTER — Encounter (INDEPENDENT_AMBULATORY_CARE_PROVIDER_SITE_OTHER): Payer: Medicare Other

## 2010-10-12 ENCOUNTER — Encounter: Payer: Self-pay | Admitting: Internal Medicine

## 2010-10-12 DIAGNOSIS — I4891 Unspecified atrial fibrillation: Secondary | ICD-10-CM

## 2010-10-12 DIAGNOSIS — Z7901 Long term (current) use of anticoagulants: Secondary | ICD-10-CM

## 2010-10-15 ENCOUNTER — Encounter: Payer: Self-pay | Admitting: Cardiovascular Disease

## 2010-10-18 NOTE — Medication Information (Signed)
Summary: ROV /MES  Anticoagulant Therapy  Managed by: Bethena Midget, RN, BSN Referring MD: Valera Castle PCP: Julieanne Manson, MD Supervising MD: Gala Romney MD, Reuel Boom Indication 1: Atrial Fibrillation (ICD-427.31) Lab Used: [facility] Site:  INR POC 3.1 INR RANGE 2.0-3.0  Dietary changes: yes       Details: Eating less green veggies  Health status changes: no    Bleeding/hemorrhagic complications: no    Recent/future hospitalizations: no    Any changes in medication regimen? no    Recent/future dental: no  Any missed doses?: no       Is patient compliant with meds? yes       Allergies: No Known Drug Allergies  Anticoagulation Management History:      The patient is taking warfarin and comes in today for a routine follow up visit.  Positive risk factors for bleeding include an age of 75 years or older.  The bleeding index is 'intermediate risk'.  Positive CHADS2 values include History of HTN and Age > 75 years old.  The start date was 07/21/2008.  His last INR was 2.9.  Anticoagulation responsible provider: Kalynne Womac MD, Reuel Boom.  INR POC: 3.1.  Cuvette Lot#: [id number].  Exp: 08/2011.    Anticoagulation Management Assessment/Plan:      The patient's current anticoagulation dose is Warfarin sodium 5 mg tabs: Use as directed by Anticoagulation Clinic.  The target INR is 2 - 3.  The next INR is due 11/09/2010.  Anticoagulation instructions were given to patient.  Results were reviewed/authorized by Bethena Midget, RN, BSN.  He was notified by Bethena Midget, RN, BSN.         Prior Anticoagulation Instructions: INR 2.6  Continue same dosage 1/2 tablet every day except 1 tablet on Mondays and Fridays. Recheck in 4 weeks.  Current Anticoagulation Instructions: INR 3.1 Skip today's dose then resume 1/2 pill everyday except 1 pill on Mondays and Fridays. Recheck in 4 weeks.

## 2010-10-25 ENCOUNTER — Encounter: Payer: Self-pay | Admitting: Cardiology

## 2010-10-25 DIAGNOSIS — I4891 Unspecified atrial fibrillation: Secondary | ICD-10-CM

## 2010-10-25 DIAGNOSIS — Z7901 Long term (current) use of anticoagulants: Secondary | ICD-10-CM

## 2010-11-03 ENCOUNTER — Other Ambulatory Visit: Payer: Self-pay | Admitting: *Deleted

## 2010-11-03 MED ORDER — METOPROLOL SUCCINATE ER 100 MG PO TB24: 100.0000 mg | ORAL_TABLET | Freq: Every day | ORAL | Status: DC

## 2010-11-09 ENCOUNTER — Ambulatory Visit (INDEPENDENT_AMBULATORY_CARE_PROVIDER_SITE_OTHER): Payer: Medicare Other | Admitting: Emergency Medicine

## 2010-11-09 DIAGNOSIS — I4891 Unspecified atrial fibrillation: Secondary | ICD-10-CM

## 2010-11-09 DIAGNOSIS — Z7901 Long term (current) use of anticoagulants: Secondary | ICD-10-CM

## 2010-11-09 NOTE — Patient Instructions (Signed)
Continue on same dosage 1/2 tablet daily except 1 tablet on Mondays and Fridays.  Recheck in 4 weeks.

## 2010-11-22 LAB — BASIC METABOLIC PANEL
CO2: 29 mEq/L (ref 19–32)
Chloride: 102 mEq/L (ref 96–112)
GFR calc Af Amer: 58 mL/min — ABNORMAL LOW (ref >60)
Potassium: 4.4 mEq/L (ref 3.5–5.1)
Sodium: 140 mEq/L (ref 135–145)

## 2010-11-22 LAB — CBC
MCHC: 34.3 g/dL (ref 30.0–36.0)
RBC: 4.75 MIL/uL (ref 4.22–5.81)
WBC: 5.1 10*3/uL (ref 4.0–10.5)

## 2010-11-22 LAB — PROTIME-INR
INR: 3 — ABNORMAL HIGH (ref 0.00–1.49)
Prothrombin Time: 33.8 seconds — ABNORMAL HIGH (ref 11.6–15.2)

## 2010-11-22 LAB — GLUCOSE, CAPILLARY: Glucose-Capillary: 116 mg/dL — ABNORMAL HIGH (ref 70–99)

## 2010-12-05 ENCOUNTER — Other Ambulatory Visit: Payer: Self-pay | Admitting: *Deleted

## 2010-12-05 MED ORDER — AMLODIPINE BESYLATE 5 MG PO TABS: 5.0000 mg | ORAL_TABLET | Freq: Every day | ORAL | Status: DC

## 2010-12-07 ENCOUNTER — Ambulatory Visit (INDEPENDENT_AMBULATORY_CARE_PROVIDER_SITE_OTHER): Payer: Medicare Other | Admitting: Cardiology

## 2010-12-07 ENCOUNTER — Ambulatory Visit (INDEPENDENT_AMBULATORY_CARE_PROVIDER_SITE_OTHER): Payer: Medicare Other | Admitting: *Deleted

## 2010-12-07 ENCOUNTER — Encounter: Payer: Self-pay | Admitting: Cardiology

## 2010-12-07 VITALS — BP 140/62 | HR 65 | Ht 68.0 in | Wt 196.0 lb

## 2010-12-07 DIAGNOSIS — Z7901 Long term (current) use of anticoagulants: Secondary | ICD-10-CM

## 2010-12-07 DIAGNOSIS — I4510 Unspecified right bundle-branch block: Secondary | ICD-10-CM

## 2010-12-07 DIAGNOSIS — I2510 Atherosclerotic heart disease of native coronary artery without angina pectoris: Secondary | ICD-10-CM

## 2010-12-07 DIAGNOSIS — R609 Edema, unspecified: Secondary | ICD-10-CM

## 2010-12-07 DIAGNOSIS — I4891 Unspecified atrial fibrillation: Secondary | ICD-10-CM

## 2010-12-07 NOTE — Patient Instructions (Signed)
Your physician recommends that you schedule a follow-up appointment in: 1 year with Dr. Wall  

## 2010-12-07 NOTE — Assessment & Plan Note (Signed)
Stable, no change in treatment.

## 2010-12-07 NOTE — Progress Notes (Signed)
   Patient ID: [name], male    DOB: [birth year], 77 y.o.   MRN: [id number]  HPI  [name] returns for E and M of his CAD and CAF. His wife has been ill and he is the primary caretaker. He is very emotional today. He denies any angina or ischemic sxs. No melena or bleeding reported.  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    Review of Systems  All other systems reviewed and are negative.      Physical Exam  Constitutional: He is oriented to person, place, and time. He appears well-developed and well-nourished.       obese  HENT:  Head: Normocephalic and atraumatic.  Eyes: EOM are normal. Pupils are equal, round, and reactive to light.  Neck: Neck supple. No JVD present. No tracheal deviation present. Thyromegaly present.       No bruits  Cardiovascular: Normal rate, normal heart sounds and intact distal pulses.  An irregularly irregular rhythm present.  Extrasystoles are present. PMI is not displaced.   Pulmonary/Chest: Effort normal and breath sounds normal.  Abdominal: Soft. Bowel sounds are normal. He exhibits no distension and no mass. There is no tenderness.       Negative bruit  Musculoskeletal:       Right shoulder: He exhibits normal range of motion.       1+ bilateral LE edema, decreased ROM  Neurological: He is alert and oriented to person, place, and time.  Skin: Skin is warm and dry.  Psychiatric:       EMOTIONAL

## 2010-12-09 ENCOUNTER — Encounter: Payer: Self-pay | Admitting: Cardiology

## 2010-12-20 NOTE — Op Note (Signed)
NAMESHIRO, ELLERMAN                ACCOUNT NO.:  [id number]   MEDICAL RECORD NO.:  [id number]          PATIENT TYPE:  OIB   LOCATION:  2899                         FACILITY:  [facility]   PHYSICIAN:  Jesse Sans. Wall, MD, FACCDATE OF BIRTH:  03-23-1928   DATE OF PROCEDURE:  09/21/2008  DATE OF DISCHARGE:  09/21/2008                               OPERATIVE REPORT   INDICATION:  Paroxysmal atrial fibrillation.   Consent was obtained.  Dr. Kipp Brood from Anesthesia administered a  total of 160 of Diprivan.   Initial synchronized cardioversion at 128 joules was unsuccessful.  We  repeated this after securing sedation at 200 joules.  He converted to  sinus bradycardia with frequent PACs.  EKG confirmed sinus bradycardia  with a right bundle which is baseline for him.   There were no complications.  He is awakening without neurological  change.   PLAN:  1. Continue current medications.  His INR today was 3.0.  2. I will arrange follow with me in the office in 2 weeks.      Thomas C. Daleen Squibb, MD, [facility]  Electronically Signed     TCW/MEDQ  D:  09/21/2008  T:  09/22/2008  Job:  [id number]   cc:   Julieanne Manson

## 2010-12-20 NOTE — Assessment & Plan Note (Signed)
[facility] OFFICE NOTE   NAME:Rumbold, MALONE VANBLARCOM                       MRN:          [id number]  DATE:04/17/2007                            DOB:          Jun 28, 1928    Mr. Voiles returns today for further management of the following issues:  1. Coronary artery disease.  He is status post coronary artery bypass      grafting in 1998.  2. Hyperlipidemia followed by Julieanne Manson, M.D.  He is due blood      work with Dr. Sullivan Lone.  3. Hypertension.   He has been doing remarkably well.  He remains very active.  He is  having no chest pain, shortness of breath, or anginal equivalence.  He  does have some mild peripheral edema.   MEDICATIONS:  1. Accuretic 20/12.5 two daily.  2. Vitamin C.  3. Multivitamin.  4. Zocor 5 mg q.h.s.  5. Toprol XL 100 mg q.p.m.  6. Norvasc 100 mg q.p.m.  7. Flomax 0.4 mg q.p.m.  8. Aspirin 81 mg a day.   PHYSICAL EXAMINATION:  VITAL SIGNS:  Blood pressure 138/62, heart rate  50.  HEART:  He has sinus bradycardia with a right bundle which is completed  now.  Weight is 190.  HEENT:  Normocephalic and atraumatic.  PERRLA.  Extraocular movements  intact.  Sclerae clear.  He has a ruddy complexion which is baseline.  Facial symmetry is normal.  Carotid upstrokes were equal bilaterally  without bruits.  There is no JVD.  Thyroid is not enlarged.  Trachea is  midline.  LUNGS:  Clear.  HEART:  Poorly appreciated PMI.  He has a soft S1 and S2 without gallop.  ABDOMEN:  Soft with good bowel sounds.  There is no hepatosplenomegaly.  EXTREMITIES:  No cyanosis, clubbing, or edema but does reveal 1+ pitting  edema.  He has some superficial varicose veins.  Pulses were 2+/4+ and  bilaterally symmetrical.  NEUROLOGY:  Intact.  SKIN:  A few ecchymoses.   ASSESSMENT:  Mr. Henner is doing remarkably well 10 years post bypass.  He is due blood work and follow up with Dr. Sullivan Lone which he will  arrange.  We will set him up for an exercise rest stress Myoview off of  Toprol.  Assuming this is negative and unchanged, we will see him back  in a year.    Thomas C. Daleen Squibb, MD, [facility]  Electronically Signed   TCW/MedQ  DD: 04/17/2007  DT: 04/17/2007  Job #: [id number]

## 2010-12-20 NOTE — Assessment & Plan Note (Signed)
[facility] OFFICE NOTE   NAME:[name]                       MRN:          [id number]  DATE:07/21/2008                            DOB:          01-30-1928    REFERRING PHYSICIAN:  Julieanne Manson, MD   I was asked by Dr. Julieanne Manson to reassess Mr. [name], the  patient well known to me, with new-onset atrial fibrillation.   He went to the beach with his family over Thanksgiving.  However, there  developed pneumonia.  Details unknown.  When he returned to [facility]  and saw Dr. Sullivan Lone on July 14, 2008, he was found to be in atrial  fibrillation.  His EKG showed a well-controlled ventricular rate of 64  beats per minute, left axis deviation, and a right bundle.  He has had  the right bundle in the past.  He has also had the left axis deviation.   His history is significant for coronary artery disease.  His most recent  stress Myoview was April 29, 2007, which showed an EF of 72% with no  ischemia.  He had relatively good exercise tolerance.   He is having no significant palpitations.  His biggest complaint is  fatigue.  He denies any orthopnea or PND.  He has had some peripheral  edema and Dr. Sullivan Lone placed him on Lasix 20 mg a day.   PAST MEDICAL HISTORY:  He has no known drug allergies.  He has no  bleeding diathesis.   CURRENT MEDICATIONS:  1. Accuretic 20/12.5 two daily.  2. Vitamin C.  3. Multivitamin.  4. Zocor 5 mg at bedtime.  5. Toprol-XL 100 mg a day.  6. Norvasc 10 mg per day.  7. Flomax 0.4 mg a day.  8. Aspirin 81 mg a day.  9. Lasix 20 mg a day.  10.Fish oil.  11.Glucosamine.  12.Mucinex.   PHYSICAL EXAMINATION:  VITAL SIGNS:  His blood pressure today is 120/60,  pulse is 64 and irregular.  His weight is 198, which is up about a  pound.  GENERAL:  He is in no acute distress.  SKIN:  Warm and dry.  HEENT:  Normal.  NECK:  Supple.  Thyroid is not enlarged.   Carotid upstrokes were equal  bilaterally without bruits, no JVD.  LUNGS:  Clear to auscultation.  HEART:  Nondisplaced PMI and irregular rate and rhythm.  No murmur, rub,  or gallop.  ABDOMEN:  Soft, good bowel sounds.  No midline bruit.  No hepatomegaly.  EXTREMITIES:  There is no cyanosis, clubbing, but he does have 1+  pitting edema at the ankles.  Pulses are intact.  NEURO:  Intact.  SKIN:  Unremarkable.   ASSESSMENT:  1. New-onset atrial fibrillation.  In the setting of pneumonia, this      could have triggered it.  He also has risk of age, coronary artery      disease, and hypertension.  2. [place] level score of 2 or greater.   PLAN:  1. Anticoagulation with Coumadin.  2. Continue current medications with good rate  control.  3. A 2-D echocardiogram to reassess LV and RV function.  4. BNP and a BMP today.  5. Once he has been therapeutic for greater than 3 weeks, we will set      him up for outpatient cardioversion.     Thomas C. Daleen Squibb, MD, [facility]  Electronically Signed    TCW/MedQ  DD: 07/21/2008  DT: 07/21/2008  Job #: [id number]   cc:   Julieanne Manson

## 2010-12-20 NOTE — Assessment & Plan Note (Signed)
[facility] OFFICE NOTE   NAME:[name], [name]                       MRN:          [id number]  DATE:12/01/2008                            DOB:          1927-11-23    [name] comes in today for followup of his atrial fib,  anticoagulation, and lower extremity edema.   He is over his prostate infection, has finished his Levaquin.   On his last visit, we started him back on his Lasix 20 mg a day.  His  weight is down 5 pounds, though he does have significant dependent edema  at the end of the day.  He has been wearing some higher socks that are  tight that make his legs feel better.   He really is not that symptomatic with his atrial fib.  His heart rate  is 60 today.  His blood pressure is 138/64, weight is down 5 to 198, the  rest of the exam is unchanged other than the fact he has 2+ pitting  edema on the right and 1+ on the left.   PLAN:  1. Increase Lasix to 40 mg a day.  2. The patient was instructed about potassium-rich foods which he      enjoys, particularly fruit.  His last potassium was 4.  3. Dress higher dress socks.  4. Change Accuretic to Accupril 40 mg q.a.m.   He is doing well and not that symptomatic with activity.  I will see him  back in 3 months.      Thomas C. Daleen Squibb, MD, [facility]  Electronically Signed    TCW/MedQ  DD: 12/01/2008  DT: 12/02/2008  Job #: [id number]   cc:   Julieanne Manson

## 2010-12-20 NOTE — Assessment & Plan Note (Signed)
[facility] OFFICE NOTE   NAME:[name], [name]                       MRN:          [id number]  DATE:09/29/2008                            DOB:          23-Sep-1927    [name] comes in today after having a DC cardioversion for atrial fib  on September 21, 2008.  He had to be shocked twice, second time at 200  joules.  He converted to sinus brady with frequent PACs.  He has a  baseline right bundle.   Since discharge he has had no complaints.  He has never really known  that he was in atrial fib which makes it difficult to follow.  I have  told him quite openly about this today and have recommend we stay with  Coumadin which he agrees to.   PHYSICAL EXAMINATION:  VITAL SIGNS:  Blood pressure today is 141/56.  His heart rate is 16.  He is in sinus rhythm with a right bundle as  proven by EKG.  HEENT:  Normal.  Carotids were equal bilaterally without bruits.  No  thyromegaly.  Trachea is midline.  LUNGS:  Clear to auscultation and percussion.  HEART:  Reveals a nondisplaced PMI.  Regular rate and rhythm, widely  split S2.  He has an area of resolving burn of the anterior chest.  ABDOMEN:  Soft, good bowel sounds.  No midline bruits.  EXTREMITIES:  No cyanosis, clubbing, or 1+ edema.  Pulses are intact.   I had a long talk with [name].  We will make the Lasix p.r.n. at this  point.  His weight has actually dropped to 7 pounds since securing sinus  rhythm.  I will see him back again in about 4-6 weeks.  As mentioned  above, he will remain on Coumadin.     Thomas C. Daleen Squibb, MD, [facility]  Electronically Signed    TCW/MedQ  DD: 09/29/2008  DT: 09/30/2008  Job #: [id number]   cc:   Julieanne Manson

## 2010-12-20 NOTE — Assessment & Plan Note (Signed)
[facility] OFFICE NOTE   NAME:[name], [name]                       MRN:          [id number]  DATE:11/10/2008                            DOB:          05-15-1928    Mr. Danzer comes in today for close followup of his atrial fib and  anticoagulation.  I saw him back after DC cardioversion on September 29, 2008.  He was in sinus brady with frequent PACs with a baseline right  bundle.   He has unfortunately developed a prostate infection.  He is now on  Levaquin for a total of 4 weeks which will end end of April.   Since last visit, he had diuresed quite a bit since being in sinus  rhythm.  We had made his Lasix p.r.n. at 20 mg a day.   Unfortunately, he has had increased fatigue and thought it was the  infection.  His pulse is irregular today and I rechecked an EKG which  shows atrial fib with a rate of 50-60.   He remains on Coumadin with therapeutic INRs.  His INR today is also  therapeutic on Levaquin.   ASSESSMENT AND PLAN:  1. Recurrent atrial fibrillation status post direct current      cardioversion without specific antiarrhythmic therapy.  2. Bradycardia which is asymptomatic.  3. Coronary artery disease which is stable.  The last stress Myoview      was stable in September 2008.  EF 72%.  4. Anticoagulation.  5. Prostatic infection on Levaquin for a month.   I had a long discussion with Mr. Speckman today.  I think his fatigue and  obviously his weight gain and edema is from being in atrial fib.  Some  contribution could be made from the prostatitis as seen are openly  discussed.   After a long discussion about antiarrhythmic therapy specifically  amiodarone, he is open to any suggestion.  I would like to wait to  resolve the Levaquin, however, until he gets over this infection.   PLAN:  1. Lasix 20 mg a day.  2. Follow up with me in about 3 weeks after he has finished his      antibiotic.  We  will discuss antiarrhythmic therapy.     Thomas C. Daleen Squibb, MD, [facility]  Electronically Signed    TCW/MedQ  DD: 11/10/2008  DT: 11/10/2008  Job #: [id number]   cc:   Julieanne Manson

## 2010-12-23 NOTE — Assessment & Plan Note (Signed)
[facility]                           GASTROENTEROLOGY OFFICE NOTE   NAME:[name], [name]                       MRN:          [id number]  DATE:05/02/2006                            DOB:          Feb 24, 1928    REFERRING PHYSICIAN:  Julieanne Manson   REASON FOR CONSULTATION:  Surveillance colonoscopy.   HISTORY:  This is a 77 year old white male with a history of hypertension,  diabetes mellitus, hyperlipidemia, renal cell carcinoma and coronary artery  disease for which he is status post coronary artery bypass grafting.  He  also has a history of adenomatous colon polyps.  He underwent a screening  colonoscopy on Dec 21, 2000.  At that time, he was found to have multiple  colon polyps measuring between 10-18 mm, in addition, diverticulosis and  internal hemorrhoids.  The polyps were removed and found to be adenomatous.  The patient was initially asked to follow-up in 1 year.  He follows up at  this time.  Since the time of his last exam, he was found to have renal cell  carcinoma on the right which was resected in [place].  He has done well  with no evidence of recurrent disease.  No adjuvant therapy needed.  Though  he has had intermittent problems with different GI complaints in the past,  he is currently without active symptoms.   PAST MEDICAL HISTORY:  As above. In addition, prior appendectomy.   ALLERGIES:  No known drug allergies.   CURRENT MEDICATIONS:  1. Toprol XL 100 mg daily.  2. Norvasc 10 mg daily.  3. Zocor 5 mg daily.  4. Quinaretic 20 per 12.5 mg twice daily.  5. Flomax 0.4 mg daily.  6. Multivitamin daily.  7. Vitamin C.  8. Aspirin 325 mg daily.   FAMILY HISTORY:  Negative for gastrointestinal malignancy.   SOCIAL HISTORY:  The patient is married with 3 children.  He lives with his  wife.  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  He does not smoke.  He  enjoys a couple of alcoholic beverages per day.   REVIEW OF  SYSTEMS:  Per diagnostic evaluation form.   PHYSICAL EXAMINATION:  GENERAL:  Well-appearing male in no acute distress.  He is alert and oriented.  VITAL SIGNS:  Blood pressure 150/62, heart rate 68, weight 199.2 pounds, he  is 5 feet 8-1/2 inches in height.  HEENT:  Sclerae anicteric.  Conjunctivae are pink.  Oral mucosa intact.  No  adenopathy.  LUNGS:  Clear.  HEART:  Regular.  ABDOMEN:  Soft without tenderness or mass.  He does have a small incisional  hernia.  Good bowel sounds heard.  EXTREMITIES:  Without edema.   IMPRESSION:  1. This is a 77 year old gentleman with multiple medical problems who      presents regarding surveillance colonoscopy.  He is an appropriate      candidate without contraindication.  The nature of the procedure, as      well as the risks, benefits and alternatives have been reviewed.  He      understood and agreed to  proceed.  2. Diverticulosis.  3. Multiple general medical problems.            ______________________________  [name]. Eda Keys., MD      JNP/MedQ  DD:  05/02/2006  DT:  05/04/2006  Job #:  [id number]   cc:   Julieanne Manson

## 2010-12-23 NOTE — Assessment & Plan Note (Signed)
[facility]                              CARDIOLOGY OFFICE NOTE   NAME:[name], [name]                       MRN:          [id number]  DATE:04/19/2006                            DOB:          [birth year]    [name] returns today for further management of his coronary artery  disease.   He is having no angina.  He walks on a daily basis.   His lipids look remarkably good as checked by Dr. Sullivan Lone March 28, 2006.  His LDL is 82, total is 174, HDL is 74, triglycerides 90.  Hemoglobin A1c  was 6.1.  He needs refills.   His meds are Toprol XL 100 mg a day, Norvasc 10 mg a day, Zocor 5 mg a day,  Tenoretic 20/12.5 b.i.d., Flomax 0.4 daily, multivitamin, vitamin C, aspirin  325 daily.   Blood pressure today is 158/73.  He is usually high in the office but is  normal at home.  Heart rate is 56 and regular.  His EKG shows sinus brady  with prominent U-waves.  There has been no change.  Carotids are full  without bruits.  Lungs are clear.  Heart reveals a regular rate and rhythm.  Sternotomy site is stable.  Abdominal exam is soft with good bowel sounds.  There is no midline bruit.  Extremities with no edema.  Pulses are intact.   ASSESSMENT AND PLAN:  [name] is doing well.  We renewed his  medications.  Will see him back in a year at which time he will need a  rest/exercise stress Myoview off of Toprol.                               Thomas C. Daleen Squibb, MD, [facility]    TCW/MedQ  DD:  04/19/2006  DT:  04/20/2006  Job #:  [id number]   cc:   Julieanne Manson

## 2011-01-04 ENCOUNTER — Encounter: Payer: Medicare Other | Admitting: *Deleted

## 2011-01-04 ENCOUNTER — Ambulatory Visit (INDEPENDENT_AMBULATORY_CARE_PROVIDER_SITE_OTHER): Payer: Medicare Other | Admitting: Emergency Medicine

## 2011-01-04 DIAGNOSIS — I4891 Unspecified atrial fibrillation: Secondary | ICD-10-CM

## 2011-01-04 DIAGNOSIS — Z7901 Long term (current) use of anticoagulants: Secondary | ICD-10-CM

## 2011-01-04 LAB — POCT INR: INR: 3.2

## 2011-01-24 ENCOUNTER — Encounter: Payer: Self-pay | Admitting: Cardiovascular Disease

## 2011-02-01 ENCOUNTER — Ambulatory Visit (INDEPENDENT_AMBULATORY_CARE_PROVIDER_SITE_OTHER): Payer: Medicare Other | Admitting: Emergency Medicine

## 2011-02-01 DIAGNOSIS — Z7901 Long term (current) use of anticoagulants: Secondary | ICD-10-CM

## 2011-02-01 DIAGNOSIS — I4891 Unspecified atrial fibrillation: Secondary | ICD-10-CM

## 2011-03-01 ENCOUNTER — Ambulatory Visit (INDEPENDENT_AMBULATORY_CARE_PROVIDER_SITE_OTHER): Payer: Medicare Other | Admitting: Emergency Medicine

## 2011-03-01 DIAGNOSIS — Z7901 Long term (current) use of anticoagulants: Secondary | ICD-10-CM

## 2011-03-01 DIAGNOSIS — I4891 Unspecified atrial fibrillation: Secondary | ICD-10-CM

## 2011-03-03 ENCOUNTER — Other Ambulatory Visit: Payer: Self-pay | Admitting: *Deleted

## 2011-03-03 MED ORDER — FUROSEMIDE 40 MG PO TABS: 40.0000 mg | ORAL_TABLET | Freq: Every day | ORAL | Status: DC

## 2011-03-03 NOTE — Telephone Encounter (Signed)
Lasix sent to [facility] today. Danielle Rankin

## 2011-03-15 ENCOUNTER — Other Ambulatory Visit: Payer: Self-pay | Admitting: *Deleted

## 2011-03-15 MED ORDER — QUINAPRIL HCL 40 MG PO TABS: 40.0000 mg | ORAL_TABLET | Freq: Every day | ORAL | Status: DC

## 2011-03-29 ENCOUNTER — Ambulatory Visit (INDEPENDENT_AMBULATORY_CARE_PROVIDER_SITE_OTHER): Payer: Medicare Other | Admitting: Emergency Medicine

## 2011-03-29 DIAGNOSIS — I4891 Unspecified atrial fibrillation: Secondary | ICD-10-CM

## 2011-03-29 DIAGNOSIS — Z7901 Long term (current) use of anticoagulants: Secondary | ICD-10-CM

## 2011-04-26 ENCOUNTER — Encounter: Payer: Medicare Other | Admitting: Emergency Medicine

## 2011-04-26 ENCOUNTER — Ambulatory Visit (INDEPENDENT_AMBULATORY_CARE_PROVIDER_SITE_OTHER): Payer: Medicare Other | Admitting: Emergency Medicine

## 2011-04-26 DIAGNOSIS — I4891 Unspecified atrial fibrillation: Secondary | ICD-10-CM

## 2011-04-26 DIAGNOSIS — Z7901 Long term (current) use of anticoagulants: Secondary | ICD-10-CM

## 2011-05-24 ENCOUNTER — Ambulatory Visit (INDEPENDENT_AMBULATORY_CARE_PROVIDER_SITE_OTHER): Payer: Medicare Other | Admitting: Emergency Medicine

## 2011-05-24 DIAGNOSIS — Z7901 Long term (current) use of anticoagulants: Secondary | ICD-10-CM

## 2011-05-24 DIAGNOSIS — I4891 Unspecified atrial fibrillation: Secondary | ICD-10-CM

## 2011-05-24 LAB — POCT INR: INR: 2.4

## 2011-07-05 ENCOUNTER — Ambulatory Visit (INDEPENDENT_AMBULATORY_CARE_PROVIDER_SITE_OTHER): Payer: Medicare Other | Admitting: Emergency Medicine

## 2011-07-05 DIAGNOSIS — I4891 Unspecified atrial fibrillation: Secondary | ICD-10-CM

## 2011-07-05 DIAGNOSIS — Z7901 Long term (current) use of anticoagulants: Secondary | ICD-10-CM

## 2011-07-05 LAB — POCT INR: INR: 2.8

## 2011-08-16 ENCOUNTER — Encounter: Payer: Medicare Other | Admitting: Emergency Medicine

## 2011-08-23 ENCOUNTER — Ambulatory Visit (INDEPENDENT_AMBULATORY_CARE_PROVIDER_SITE_OTHER): Payer: Medicare Other | Admitting: Emergency Medicine

## 2011-08-23 DIAGNOSIS — I4891 Unspecified atrial fibrillation: Secondary | ICD-10-CM

## 2011-08-23 DIAGNOSIS — Z7901 Long term (current) use of anticoagulants: Secondary | ICD-10-CM

## 2011-09-01 ENCOUNTER — Other Ambulatory Visit: Payer: Self-pay | Admitting: Cardiology

## 2011-09-14 ENCOUNTER — Other Ambulatory Visit: Payer: Self-pay | Admitting: Cardiology

## 2011-10-04 ENCOUNTER — Ambulatory Visit (INDEPENDENT_AMBULATORY_CARE_PROVIDER_SITE_OTHER): Payer: Medicare Other | Admitting: *Deleted

## 2011-10-04 DIAGNOSIS — I4891 Unspecified atrial fibrillation: Secondary | ICD-10-CM

## 2011-10-04 DIAGNOSIS — Z7901 Long term (current) use of anticoagulants: Secondary | ICD-10-CM

## 2011-11-06 ENCOUNTER — Other Ambulatory Visit: Payer: Self-pay | Admitting: Cardiology

## 2011-11-09 ENCOUNTER — Other Ambulatory Visit: Payer: Self-pay

## 2011-11-09 MED ORDER — METOPROLOL SUCCINATE ER 100 MG PO TB24: 100.0000 mg | ORAL_TABLET | Freq: Every day | ORAL | Status: DC

## 2011-11-09 NOTE — Telephone Encounter (Signed)
..   Requested Prescriptions   Signed Prescriptions Disp Refills  . metoprolol succinate (TOPROL-XL) 100 MG 24 hr tablet 30 tablet 3    Sig: Take 1 tablet (100 mg total) by mouth daily.    Authorizing Provider: Gaylord Shih    Ordering User: Lyncoln Ledgerwood M  patient has appointment in May

## 2011-11-15 ENCOUNTER — Ambulatory Visit (INDEPENDENT_AMBULATORY_CARE_PROVIDER_SITE_OTHER): Payer: Medicare Other

## 2011-11-15 DIAGNOSIS — I4891 Unspecified atrial fibrillation: Secondary | ICD-10-CM

## 2011-11-15 DIAGNOSIS — Z7901 Long term (current) use of anticoagulants: Secondary | ICD-10-CM

## 2011-11-15 LAB — POCT INR: INR: 2.6

## 2011-11-16 ENCOUNTER — Other Ambulatory Visit: Payer: Self-pay | Admitting: Cardiology

## 2011-11-16 NOTE — Telephone Encounter (Signed)
Refilled amlodipine 

## 2011-12-01 ENCOUNTER — Other Ambulatory Visit: Payer: Self-pay | Admitting: *Deleted

## 2011-12-01 MED ORDER — FUROSEMIDE 40 MG PO TABS: 40.0000 mg | ORAL_TABLET | Freq: Every day | ORAL | Status: DC

## 2011-12-13 ENCOUNTER — Ambulatory Visit (INDEPENDENT_AMBULATORY_CARE_PROVIDER_SITE_OTHER): Payer: Medicare Other | Admitting: Cardiology

## 2011-12-13 ENCOUNTER — Encounter: Payer: Self-pay | Admitting: Cardiology

## 2011-12-13 VITALS — BP 164/82 | HR 66 | Ht 68.0 in | Wt 198.0 lb

## 2011-12-13 DIAGNOSIS — Z7901 Long term (current) use of anticoagulants: Secondary | ICD-10-CM

## 2011-12-13 DIAGNOSIS — R0989 Other specified symptoms and signs involving the circulatory and respiratory systems: Secondary | ICD-10-CM

## 2011-12-13 DIAGNOSIS — R609 Edema, unspecified: Secondary | ICD-10-CM

## 2011-12-13 DIAGNOSIS — I4510 Unspecified right bundle-branch block: Secondary | ICD-10-CM

## 2011-12-13 DIAGNOSIS — I4891 Unspecified atrial fibrillation: Secondary | ICD-10-CM

## 2011-12-13 DIAGNOSIS — I10 Essential (primary) hypertension: Secondary | ICD-10-CM

## 2011-12-13 DIAGNOSIS — R0609 Other forms of dyspnea: Secondary | ICD-10-CM

## 2011-12-13 DIAGNOSIS — I2510 Atherosclerotic heart disease of native coronary artery without angina pectoris: Secondary | ICD-10-CM

## 2011-12-13 NOTE — Patient Instructions (Signed)
Your physician recommends that you continue on your current medications as directed. Please refer to the Current Medication list given to you today.  Your physician wants you to follow-up in: 1 year. You will receive a reminder letter in the mail two months in advance. If you don't receive a letter, please call our office to schedule the follow-up appointment.  

## 2011-12-13 NOTE — Assessment & Plan Note (Signed)
Stable. Continue aggressive secondary preventive therapy. Return to the office in one year.

## 2011-12-13 NOTE — Assessment & Plan Note (Signed)
Stable. Continue rate control anticoagulation.

## 2011-12-13 NOTE — Progress Notes (Signed)
HPI Mr. [name] returns today for evaluation and management of his coronary disease and chronic A. fib. He said tough time this past year with his wife's illness. He does try to get out of the house a couple times a week. He still has his chronic dyspnea on exertion. It has not changed. He denies any angina or symptoms of atrial fib. He has dependent edema that goes down at night. This is unchanged.  Reports no bleeding or melena. Past Medical History  Diagnosis Date  . Atrial fibrillation     coumadin therapy  . Coronary artery disease     native vessel  . Hypertension   . RBBB (right bundle branch block)   . Edema     Current Outpatient Prescriptions  Medication Sig Dispense Refill  . amLODipine (NORVASC) 5 MG tablet TAKE ONE TABLET BY MOUTH DAILY  90 tablet  1  . Ascorbic Acid (VITAMIN C) 500 MG tablet Take 500 mg by mouth daily.        Marland Kitchen aspirin 81 MG tablet Take 81 mg by mouth daily.        . fish oil-omega-3 fatty acids 1000 MG capsule Take 1 g by mouth daily.        . furosemide (LASIX) 40 MG tablet Take 1 tablet (40 mg total) by mouth daily.  30 tablet  6  . glucosamine-chondroitin 500-400 MG tablet Take 1 tablet by mouth daily.        . metoprolol succinate (TOPROL-XL) 100 MG 24 hr tablet Take 1 tablet (100 mg total) by mouth daily.  30 tablet  3  . Multiple Vitamin (MULTIVITAMIN) tablet Take 1 tablet by mouth daily.        . quinapril (ACCUPRIL) 40 MG tablet TAKE 1 TABLET (40 MG TOTAL) BY MOUTH AT BEDTIME.  30 tablet  5  . simvastatin (ZOCOR) 10 MG tablet TAKE ONE TABLET BY MOUTH DAILY AT BEDTIME  90 tablet  2  . Tamsulosin HCl (FLOMAX) 0.4 MG CAPS Take 0.4 mg by mouth daily.        Marland Kitchen warfarin (COUMADIN) 5 MG tablet Take 5 mg by mouth as directed.          No Known Allergies  Family History  Problem Relation Age of Onset  . Other Neg Hx     Gastrointestinal Malignancy    History   Social History  . Marital Status: Married    Spouse Name: N/A    Number of Children:  3  . 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 Smoking status: Former Games developer  . Smokeless tobacco: Not on file  . Alcohol Use: Yes     He enjoys a couple of alcoholic beverages per day  . Drug Use: Not on file  . Sexually Active: Not on file   Other Topics Concern  . Not on file   Social History Narrative   He lives with his wife.    ROS ALL NEGATIVE EXCEPT THOSE NOTED IN HPI  PE  General Appearance: well developed, well nourished in no acute distress, overweight HEENT: symmetrical face, PERRLA, good dentition  Neck: no JVD, thyromegaly, or adenopathy, trachea midline Chest: symmetric without deformity Cardiac: PMI non-displaced, irregular rate and rhythm, normal S1, S2, no gallop or murmur Lung: clear to ausculation and percussion Vascular: all pulses full without bruits  Abdominal: nondistended, nontender, good bowel sounds, no HSM, no bruits  Extremities: no cyanosis, clubbing , 1+ edema bilaterally no sign of DVT, varicosities upper lower extremities Skin: normal color, no rashes Neuro: alert and oriented x 3, non-focal Pysch: normal affect  EKG Chronic atrial fibrillation with a well-controlled rate, right bundle branch block, no change BMET    Component Value Date/Time   NA 141 02/12/2009 0000   K 4.1 02/12/2009 0000   CL 106 02/12/2009 0000   CO2 23 02/12/2009 0000   GLUCOSE 118* 09/21/2008 1139   BUN 16 02/12/2009 0000   CREATININE 1.22 02/12/2009 0000   CALCIUM 9.0 02/12/2009 0000   GFRNONAA 48* 09/21/2008 1139   GFRAA  Value: 58        The eGFR has been calculated using the MDRD equation. This calculation has not been validated in all clinical situations. eGFR's persistently <60 mL/min signify possible Chronic Kidney Disease.* 09/21/2008 1139    Lipid Panel     Component Value Date/Time   CHOL 125 02/12/2009 0000   HDL 55 02/12/2009 0000   LDLCALC 61 02/12/2009 0000    CBC    Component Value  Date/Time   WBC 5.1 09/21/2008 1139   RBC 4.75 09/21/2008 1139   HGB 14.0 09/21/2008 1139   HCT 40.8 09/21/2008 1139   PLT 151 09/21/2008 1139   MCV 85.8 09/21/2008 1139   MCHC 34.3 09/21/2008 1139   RDW 13.8 09/21/2008 1139

## 2011-12-22 ENCOUNTER — Other Ambulatory Visit: Payer: Self-pay | Admitting: Cardiology

## 2011-12-27 ENCOUNTER — Ambulatory Visit (INDEPENDENT_AMBULATORY_CARE_PROVIDER_SITE_OTHER): Payer: Medicare Other

## 2011-12-27 DIAGNOSIS — I4891 Unspecified atrial fibrillation: Secondary | ICD-10-CM

## 2011-12-27 DIAGNOSIS — Z7901 Long term (current) use of anticoagulants: Secondary | ICD-10-CM

## 2011-12-27 LAB — POCT INR: INR: 2.7

## 2012-02-07 ENCOUNTER — Ambulatory Visit (INDEPENDENT_AMBULATORY_CARE_PROVIDER_SITE_OTHER): Payer: Medicare Other | Admitting: *Deleted

## 2012-02-07 ENCOUNTER — Other Ambulatory Visit: Payer: Self-pay | Admitting: Cardiology

## 2012-02-07 DIAGNOSIS — I4891 Unspecified atrial fibrillation: Secondary | ICD-10-CM

## 2012-02-07 DIAGNOSIS — Z7901 Long term (current) use of anticoagulants: Secondary | ICD-10-CM

## 2012-02-29 ENCOUNTER — Other Ambulatory Visit: Payer: Self-pay | Admitting: Cardiology

## 2012-03-18 ENCOUNTER — Other Ambulatory Visit: Payer: Self-pay | Admitting: Cardiology

## 2012-03-20 ENCOUNTER — Ambulatory Visit (INDEPENDENT_AMBULATORY_CARE_PROVIDER_SITE_OTHER): Payer: Medicare Other

## 2012-03-20 DIAGNOSIS — I4891 Unspecified atrial fibrillation: Secondary | ICD-10-CM

## 2012-03-20 DIAGNOSIS — Z7901 Long term (current) use of anticoagulants: Secondary | ICD-10-CM

## 2012-03-20 LAB — POCT INR: INR: 2.5

## 2012-04-04 ENCOUNTER — Other Ambulatory Visit: Payer: Self-pay | Admitting: Cardiology

## 2012-04-10 ENCOUNTER — Other Ambulatory Visit: Payer: Self-pay | Admitting: Cardiology

## 2012-05-01 ENCOUNTER — Ambulatory Visit (INDEPENDENT_AMBULATORY_CARE_PROVIDER_SITE_OTHER): Payer: Medicare Other

## 2012-05-01 DIAGNOSIS — I4891 Unspecified atrial fibrillation: Secondary | ICD-10-CM

## 2012-05-01 DIAGNOSIS — Z7901 Long term (current) use of anticoagulants: Secondary | ICD-10-CM

## 2012-05-01 LAB — POCT INR: INR: 2.6

## 2012-05-06 ENCOUNTER — Other Ambulatory Visit: Payer: Self-pay | Admitting: Cardiology

## 2012-06-02 ENCOUNTER — Other Ambulatory Visit: Payer: Self-pay | Admitting: Cardiology

## 2012-06-11 ENCOUNTER — Other Ambulatory Visit: Payer: Self-pay | Admitting: Cardiology

## 2012-06-12 ENCOUNTER — Ambulatory Visit (INDEPENDENT_AMBULATORY_CARE_PROVIDER_SITE_OTHER): Payer: Medicare Other

## 2012-06-12 DIAGNOSIS — I4891 Unspecified atrial fibrillation: Secondary | ICD-10-CM

## 2012-06-12 DIAGNOSIS — Z7901 Long term (current) use of anticoagulants: Secondary | ICD-10-CM

## 2012-06-12 LAB — POCT INR: INR: 2.5

## 2012-07-24 ENCOUNTER — Ambulatory Visit (INDEPENDENT_AMBULATORY_CARE_PROVIDER_SITE_OTHER): Payer: Medicare Other

## 2012-07-24 DIAGNOSIS — Z7901 Long term (current) use of anticoagulants: Secondary | ICD-10-CM

## 2012-07-24 DIAGNOSIS — I4891 Unspecified atrial fibrillation: Secondary | ICD-10-CM

## 2012-08-28 ENCOUNTER — Ambulatory Visit (INDEPENDENT_AMBULATORY_CARE_PROVIDER_SITE_OTHER): Payer: Medicare Other

## 2012-08-28 DIAGNOSIS — I4891 Unspecified atrial fibrillation: Secondary | ICD-10-CM

## 2012-08-28 DIAGNOSIS — Z7901 Long term (current) use of anticoagulants: Secondary | ICD-10-CM

## 2012-09-18 ENCOUNTER — Other Ambulatory Visit: Payer: Self-pay | Admitting: Cardiology

## 2012-09-21 ENCOUNTER — Other Ambulatory Visit: Payer: Self-pay | Admitting: Cardiology

## 2012-10-07 ENCOUNTER — Other Ambulatory Visit: Payer: Self-pay | Admitting: Cardiology

## 2012-10-09 ENCOUNTER — Ambulatory Visit (INDEPENDENT_AMBULATORY_CARE_PROVIDER_SITE_OTHER): Payer: Medicare Other

## 2012-10-09 DIAGNOSIS — Z7901 Long term (current) use of anticoagulants: Secondary | ICD-10-CM

## 2012-10-09 DIAGNOSIS — I4891 Unspecified atrial fibrillation: Secondary | ICD-10-CM

## 2012-11-01 ENCOUNTER — Other Ambulatory Visit: Payer: Self-pay | Admitting: Cardiology

## 2012-11-10 ENCOUNTER — Other Ambulatory Visit: Payer: Self-pay | Admitting: Cardiology

## 2012-11-20 ENCOUNTER — Ambulatory Visit (INDEPENDENT_AMBULATORY_CARE_PROVIDER_SITE_OTHER): Payer: Medicare Other

## 2012-11-20 DIAGNOSIS — Z7901 Long term (current) use of anticoagulants: Secondary | ICD-10-CM

## 2012-11-20 DIAGNOSIS — I4891 Unspecified atrial fibrillation: Secondary | ICD-10-CM

## 2013-01-01 ENCOUNTER — Ambulatory Visit (INDEPENDENT_AMBULATORY_CARE_PROVIDER_SITE_OTHER): Payer: Medicare Other

## 2013-01-01 DIAGNOSIS — I4891 Unspecified atrial fibrillation: Secondary | ICD-10-CM

## 2013-01-01 DIAGNOSIS — Z7901 Long term (current) use of anticoagulants: Secondary | ICD-10-CM

## 2013-01-15 ENCOUNTER — Ambulatory Visit: Payer: Medicare Other | Admitting: Cardiology

## 2013-02-12 ENCOUNTER — Ambulatory Visit (INDEPENDENT_AMBULATORY_CARE_PROVIDER_SITE_OTHER): Payer: Self-pay

## 2013-02-12 DIAGNOSIS — I4891 Unspecified atrial fibrillation: Secondary | ICD-10-CM

## 2013-02-12 DIAGNOSIS — Z7901 Long term (current) use of anticoagulants: Secondary | ICD-10-CM

## 2013-02-13 ENCOUNTER — Other Ambulatory Visit: Payer: Self-pay | Admitting: Cardiology

## 2013-02-14 ENCOUNTER — Other Ambulatory Visit: Payer: Self-pay | Admitting: *Deleted

## 2013-02-19 ENCOUNTER — Ambulatory Visit (INDEPENDENT_AMBULATORY_CARE_PROVIDER_SITE_OTHER): Payer: Self-pay | Admitting: Cardiology

## 2013-02-19 ENCOUNTER — Encounter: Payer: Self-pay | Admitting: Cardiology

## 2013-02-19 VITALS — BP 130/68 | HR 57 | Ht 67.0 in | Wt 190.1 lb

## 2013-02-19 DIAGNOSIS — Z7901 Long term (current) use of anticoagulants: Secondary | ICD-10-CM

## 2013-02-19 DIAGNOSIS — I4891 Unspecified atrial fibrillation: Secondary | ICD-10-CM

## 2013-02-19 DIAGNOSIS — I10 Essential (primary) hypertension: Secondary | ICD-10-CM

## 2013-02-19 DIAGNOSIS — I2510 Atherosclerotic heart disease of native coronary artery without angina pectoris: Secondary | ICD-10-CM

## 2013-02-19 DIAGNOSIS — R609 Edema, unspecified: Secondary | ICD-10-CM

## 2013-02-19 NOTE — Patient Instructions (Addendum)
Your physician recommends that you continue on your current medications as directed. Please refer to the Current Medication list given to you today.  Your physician wants you to follow-up in: 1 year with Dr. Mariah Milling in the [place] office.  You will receive a reminder letter in the mail two months in advance. If you don't receive a letter, please call our office to schedule the follow-up appointment.

## 2013-02-19 NOTE — Progress Notes (Signed)
HPI [name] returns today for evaluation and management of his coronary artery disease, history bypass surgery 17 years ago, chronic atrial fib and anticoagulation.  He lost his wife back in December. He is very emotional about this today. He is not as active as he used to be. His swelling in his legs is slightly worse but doesn't bother him. He is taking Lasix and HCTZ daily.  He denies any melena or bleeding. He has his pro time checked in [place].  Past Medical History  Diagnosis Date  . Atrial fibrillation     coumadin therapy  . Coronary artery disease     native vessel  . Hypertension   . RBBB (right bundle branch block)   . Edema     Current Outpatient Prescriptions  Medication Sig Dispense Refill  . amLODipine (NORVASC) 5 MG tablet TAKE ONE TABLET BY MOUTH DAILY  90 tablet  2  . Ascorbic Acid (VITAMIN C) 500 MG tablet Take 500 mg by mouth daily.        Marland Kitchen aspirin 81 MG tablet Take 81 mg by mouth daily.        . fish oil-omega-3 fatty acids 1000 MG capsule Take 1 g by mouth daily.        . furosemide (LASIX) 40 MG tablet Take 1 tablet (40 mg total) by mouth daily.  30 tablet  6  . glucosamine-chondroitin 500-400 MG tablet Take 1 tablet by mouth daily.        . hydrochlorothiazide (HYDRODIURIL) 25 MG tablet Take 25 mg by mouth daily.      . metoprolol succinate (TOPROL-XL) 100 MG 24 hr tablet TAKE 1 TABLET (100 MG TOTAL) BY MOUTH DAILY.  30 tablet  5  . Multiple Vitamin (MULTIVITAMIN) tablet Take 1 tablet by mouth daily.        . quinapril (ACCUPRIL) 40 MG tablet TAKE 1 TABLET (40 MG TOTAL) BY MOUTH AT BEDTIME.  30 tablet  5  . simvastatin (ZOCOR) 10 MG tablet TAKE ONE TABLET BY MOUTH DAILY AT BEDTIME  90 tablet  1  . Tamsulosin HCl (FLOMAX) 0.4 MG CAPS Take 0.4 mg by mouth daily.        Marland Kitchen warfarin (COUMADIN) 5 MG tablet Take as directed by coumadin clinic  This is 3 months  supply  90 tablet  0   No current facility-administered medications for this visit.    Allergies   Allergen Reactions  . Pollen Extract     Family History  Problem Relation Age of Onset  . Other Neg Hx     Gastrointestinal Malignancy    History   Social History  . Marital Status: Married    Spouse Name: N/A    Number of Children: 3  . 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 Smoking status: Former Games developer  . Smokeless tobacco: Not on file  . Alcohol Use: Yes     Comment: He enjoys a couple of alcoholic beverages per day  . Drug Use: Not on file  . Sexually Active: Not on file   Other Topics Concern  . Not on file   Social History Narrative   He lives with his wife.          ROS ALL NEGATIVE EXCEPT THOSE NOTED IN HPI  PE  General Appearance: well developed, well nourished in no acute distress, HEENT: symmetrical face, PERRLA, good dentition  Neck: no JVD, thyromegaly, or adenopathy, trachea midline Chest: symmetric without deformity Cardiac: PMI non-displaced, irregular rate and rhythm normal S1, S2, no gallop or murmur Lung: clear to ausculation and percussion Vascular: all pulses full without bruits  Abdominal: nondistended, nontender, good bowel sounds, no HSM, no bruits Extremities: no cyanosis, clubbing , 1-2+ pitting edema with chronic skin changes., no sign of DVT, varicosities and chronic bruising Skin: normal color, no rashes Neuro: alert and oriented x 3, non-focal Pysch: normal affect  EKG  BMET    Component Value Date/Time   NA 141 02/12/2009 0000   K 4.1 02/12/2009 0000   CL 106 02/12/2009 0000   CO2 23 02/12/2009 0000   GLUCOSE 118* 09/21/2008 1139   BUN 16 02/12/2009 0000   CREATININE 1.22 02/12/2009 0000   CALCIUM 9.0 02/12/2009 0000   GFRNONAA 48* 09/21/2008 1139   GFRAA  Value: 58        The eGFR has been calculated using the MDRD equation. This calculation has not been validated in all clinical situations. eGFR's persistently <60 mL/min signify possible Chronic Kidney  Disease.* 09/21/2008 1139    Lipid Panel     Component Value Date/Time   CHOL 125 02/12/2009 0000   HDL 55 02/12/2009 0000   LDLCALC 61 02/12/2009 0000    CBC    Component Value Date/Time   WBC 5.1 09/21/2008 1139   RBC 4.75 09/21/2008 1139   HGB 14.0 09/21/2008 1139   HCT 40.8 09/21/2008 1139   PLT 151 09/21/2008 1139   MCV 85.8 09/21/2008 1139   MCHC 34.3 09/21/2008 1139   RDW 13.8 09/21/2008 1139

## 2013-02-19 NOTE — Assessment & Plan Note (Signed)
Stable. Continue secondary preventative therapy. 

## 2013-02-19 NOTE — Assessment & Plan Note (Signed)
He will continue with his diuretics. He will try support as well.

## 2013-02-19 NOTE — Assessment & Plan Note (Signed)
Continue rate control with coagulation. Relatively asymptomatic.

## 2013-03-04 ENCOUNTER — Other Ambulatory Visit: Payer: Self-pay | Admitting: Cardiology

## 2013-03-07 ENCOUNTER — Other Ambulatory Visit: Payer: Self-pay | Admitting: Cardiology

## 2013-03-19 ENCOUNTER — Other Ambulatory Visit: Payer: Self-pay | Admitting: Cardiology

## 2013-03-26 ENCOUNTER — Ambulatory Visit (INDEPENDENT_AMBULATORY_CARE_PROVIDER_SITE_OTHER): Payer: Self-pay | Admitting: *Deleted

## 2013-03-26 DIAGNOSIS — Z7901 Long term (current) use of anticoagulants: Secondary | ICD-10-CM

## 2013-03-26 DIAGNOSIS — I4891 Unspecified atrial fibrillation: Secondary | ICD-10-CM

## 2013-03-26 LAB — POCT INR: INR: 2.1

## 2013-05-02 ENCOUNTER — Other Ambulatory Visit: Payer: Self-pay | Admitting: Cardiology

## 2013-05-07 ENCOUNTER — Ambulatory Visit (INDEPENDENT_AMBULATORY_CARE_PROVIDER_SITE_OTHER): Payer: Medicare Other | Admitting: General Practice

## 2013-05-07 DIAGNOSIS — Z7901 Long term (current) use of anticoagulants: Secondary | ICD-10-CM

## 2013-05-07 DIAGNOSIS — I4891 Unspecified atrial fibrillation: Secondary | ICD-10-CM

## 2013-06-18 ENCOUNTER — Ambulatory Visit (INDEPENDENT_AMBULATORY_CARE_PROVIDER_SITE_OTHER): Payer: Medicare Other | Admitting: *Deleted

## 2013-06-18 DIAGNOSIS — I4891 Unspecified atrial fibrillation: Secondary | ICD-10-CM

## 2013-06-18 DIAGNOSIS — Z7901 Long term (current) use of anticoagulants: Secondary | ICD-10-CM

## 2013-06-26 ENCOUNTER — Other Ambulatory Visit: Payer: Self-pay | Admitting: Cardiology

## 2013-07-16 ENCOUNTER — Ambulatory Visit (INDEPENDENT_AMBULATORY_CARE_PROVIDER_SITE_OTHER): Payer: Medicare Other | Admitting: General Practice

## 2013-07-16 DIAGNOSIS — Z7901 Long term (current) use of anticoagulants: Secondary | ICD-10-CM

## 2013-07-16 DIAGNOSIS — I4891 Unspecified atrial fibrillation: Secondary | ICD-10-CM

## 2013-08-20 ENCOUNTER — Ambulatory Visit (INDEPENDENT_AMBULATORY_CARE_PROVIDER_SITE_OTHER): Payer: Medicare Other

## 2013-08-20 DIAGNOSIS — I4891 Unspecified atrial fibrillation: Secondary | ICD-10-CM

## 2013-08-20 DIAGNOSIS — Z7901 Long term (current) use of anticoagulants: Secondary | ICD-10-CM

## 2013-08-20 LAB — POCT INR: INR: 2.4

## 2013-09-16 ENCOUNTER — Telehealth: Payer: Self-pay | Admitting: *Deleted

## 2013-09-16 NOTE — Telephone Encounter (Signed)
Sharyn Lull w/ Dr. Talbert Forest called looking for surgical clearance for caract surgery this Thurs 09/18/13 Please send STAT!! Fax# [phone]

## 2013-09-16 NOTE — Telephone Encounter (Signed)
Spoke w/ Sharyn Lull and advised her that pt sees Dr. Verl Blalock in [place]. Gave her fax # to send clearance request.

## 2013-09-17 ENCOUNTER — Encounter: Payer: Self-pay | Admitting: Physician Assistant

## 2013-09-17 ENCOUNTER — Telehealth: Payer: Self-pay

## 2013-09-17 ENCOUNTER — Ambulatory Visit (INDEPENDENT_AMBULATORY_CARE_PROVIDER_SITE_OTHER): Payer: Medicare Other | Admitting: Physician Assistant

## 2013-09-17 VITALS — BP 155/80 | HR 65 | Ht 67.0 in | Wt 198.5 lb

## 2013-09-17 DIAGNOSIS — Z01818 Encounter for other preprocedural examination: Secondary | ICD-10-CM | POA: Insufficient documentation

## 2013-09-17 DIAGNOSIS — R609 Edema, unspecified: Secondary | ICD-10-CM

## 2013-09-17 DIAGNOSIS — I2510 Atherosclerotic heart disease of native coronary artery without angina pectoris: Secondary | ICD-10-CM

## 2013-09-17 DIAGNOSIS — I10 Essential (primary) hypertension: Secondary | ICD-10-CM

## 2013-09-17 DIAGNOSIS — I4891 Unspecified atrial fibrillation: Secondary | ICD-10-CM

## 2013-09-17 DIAGNOSIS — I4821 Permanent atrial fibrillation: Secondary | ICD-10-CM | POA: Insufficient documentation

## 2013-09-17 MED ORDER — QUINAPRIL HCL 40 MG PO TABS: 40.0000 mg | ORAL_TABLET | Freq: Two times a day (BID) | ORAL | Status: DC

## 2013-09-17 MED ORDER — AMLODIPINE BESYLATE 10 MG PO TABS: 10.0000 mg | ORAL_TABLET | Freq: Every day | ORAL | Status: DC

## 2013-09-17 NOTE — Patient Instructions (Signed)
Please stop hydrochlorothiazide and increase amlodipine to 10mg  daily for high blood pressure.   You are stable from a heart standpoint to proceed with cataract surgery. We will contact your eye specialists for clearance.   Please continue taking Coumadin as prescribed.   We will see you back in 3 months for regular follow-up.

## 2013-09-17 NOTE — Assessment & Plan Note (Signed)
Asymptomatic, rate-controlled. Continue metoprolol and warfarin.

## 2013-09-17 NOTE — Telephone Encounter (Signed)
Attempted to contact Dr. Merry Proud office.  Their office is closed for lunch until 1:00.  Attempted to contact pt.  Left message on machine for him to call back.

## 2013-09-17 NOTE — Assessment & Plan Note (Signed)
BP 155/80. On two diuretics, hold HCTZ. Continue Lasix given LE edema. Hold amlodipine with edema. Will increase ACEi to BID frequency.

## 2013-09-17 NOTE — Telephone Encounter (Signed)
Spoke w/ pt.  Advised him that he would need to be seen in our office for cardiac clearance.   He is agreeable to seeing Nicole Kindred, [place] today at 3:00, as he is scheduled for cataract surgery on Friday. Left message for Selinda Eon at Dr. Talbert Forest' office.

## 2013-09-17 NOTE — Progress Notes (Signed)
Patient ID: [name], male   DOB: 11-May-1928, 80 y.o.   MRN: [id number]   Date:  09/17/2013   ID:  [name], DOB 24-Aug-1927, MRN [id number]  PCP:  Aaron Post, MD  Primary Cardiologist:  Aaron Bridge, MD; previously T. Wall, MD   History of Present Illness:  [name] is a 80 y.o. male w/ PMHx s/f CAD s/p CABG 17 years ago, permanent atrial fibrillation (diagnosed 3 years ago), chronic Coumadin anticoagulation, chronic RBBB, HTN and chronic LE edema.  He reports feeling well since last follow-up. He denies exertional chest discomfort, dyspnea, orthopnea, PND. LE edema persists, no better or worse from before. Does not wear compression stockings or watch fluid/salt intake. Denies palpitations, lightheadedness, fatigue or syncope. He is able to complete > 4 METs without incident.  He is having cataract surgery on Friday and needs cardiac clearance. He tells me the anesthesiologist is using minimal anesthesia. Coumadin does not need to be held as there is a low risk of bleeding. Given cardiac history, eye specialist wanted clearance prior to proceed.  EKG: atrial fibrillation, 65 bpm, RBBB, inferior IVCD, borderline RAD (unchanged from 02/2013 tracing)  Wt Readings from Last 3 Encounters:  09/17/13 198 lb 8 oz (90.039 kg)  02/19/13 190 lb 1.9 oz (86.238 kg)  12/13/11 198 lb (89.812 kg)     Past Medical History  Diagnosis Date  . Atrial fibrillation     coumadin therapy  . Coronary artery disease     native vessel  . Hypertension   . RBBB (right bundle branch block)   . Edema     Current Outpatient Prescriptions  Medication Sig Dispense Refill  . amLODipine (NORVASC) 5 MG tablet TAKE ONE TABLET BY MOUTH DAILY  90 tablet  3  . Ascorbic Acid (VITAMIN C) 500 MG tablet Take 500 mg by mouth daily.        Marland Kitchen aspirin 81 MG tablet Take 81 mg by mouth daily.        . fish oil-omega-3 fatty acids 1000 MG capsule Take 1 g by mouth daily.        . fluticasone (FLONASE) 50  MCG/ACT nasal spray Place 2 sprays into the nose daily.      . furosemide (LASIX) 40 MG tablet TAKE 1 TABLET (40 MG TOTAL) BY MOUTH DAILY.  30 tablet  11  . glucosamine-chondroitin 500-400 MG tablet Take 1 tablet by mouth daily.        . hydrochlorothiazide (HYDRODIURIL) 25 MG tablet Take 25 mg by mouth daily.      Marland Kitchen loratadine (CLARITIN) 10 MG tablet Take 10 mg by mouth daily.      . metoprolol succinate (TOPROL-XL) 100 MG 24 hr tablet TAKE 1 TABLET (100 MG TOTAL) BY MOUTH DAILY.  30 tablet  4  . Multiple Vitamin (MULTIVITAMIN) tablet Take 1 tablet by mouth daily.        . quinapril (ACCUPRIL) 40 MG tablet TAKE 1 TABLET (40 MG TOTAL) BY MOUTH AT BEDTIME.  30 tablet  4  . simvastatin (ZOCOR) 10 MG tablet TAKE ONE TABLET BY MOUTH DAILY AT BEDTIME  90 tablet  3  . Tamsulosin HCl (FLOMAX) 0.4 MG CAPS Take 0.4 mg by mouth daily.        Marland Kitchen warfarin (COUMADIN) 5 MG tablet TAKE AS DIRECTED BY COUMADIN CLINIC  THIS IS 3 MONTHS  SUPPLY  90 tablet  1   No current facility-administered medications for this visit.  Allergies:    Allergies  Allergen Reactions  . Pollen Extract     Social History:  The patient  reports that he has quit smoking. He does not have any smokeless tobacco history on file. He reports that he drinks alcohol.   Family History:  Family History  Problem Relation Age of Onset  . Other Neg Hx     Gastrointestinal Malignancy   Review of Systems: General: negative for chills, fever, night sweats or weight changes.  Cardiovascular: positive for edema, negative for chest pain, dyspnea on exertion, orthopnea, palpitations, paroxysmal nocturnal dyspnea or shortness of breath Dermatological: negative for rash Respiratory: negative for cough or wheezing Urologic: negative for hematuria Abdominal:  negative for nausea, vomiting, diarrhea, bright red blood per rectum, melena, or hematemesis Neurologic: negative for visual changes, syncope, or dizziness All other systems reviewed  and are otherwise negative except as noted above.  PHYSICAL EXAM: VS:  BP 155/80  Pulse 65  Ht 5\' 7"  (1.702 m)  Wt 198 lb 8 oz (90.039 kg)  BMI 31.08 kg/m2 Well nourished, well developed, in no acute distress HEENT: normal, PERRL Neck: no JVD or bruits Cardiac:  normal S1, S2; irregularly irregular, no murmur or gallops Lungs:  clear to auscultation bilaterally, no wheezing, rhonchi or rales Abd: soft, nontender, no hepatomegaly, normoactive BS x 4 quads Ext: 1+ bilateral pretibial edema with hyperpigmentation and induration, no cyanosis or clubbing Skin: warm and dry, cap refill < 2 sec Neuro:  CNs 2-12 intact, no focal abnormalities noted Musculoskeletal: strength and tone appropriate for age  Psych: normal affect

## 2013-09-17 NOTE — Assessment & Plan Note (Signed)
Chronic stasis changes on exam. Suspect a component of CVI. No other associated symptoms concerning for CHF. This has been chronic. Will hold amlodipine. Advised to wear compression stockings/elevate legs. Discussed fluid/salt restriction.

## 2013-09-17 NOTE — Assessment & Plan Note (Addendum)
The patient denies exertional chest pain, dyspnea, orthopnea, PND, weight increase, palpitations or syncope. He is able to complete > 4 METs w/o incident. He is relatively asymptomatic from permanent atrial fibrillation. This is rate-controlled, and he continues to take Coumadin as prescribed. These do not need to be held leading up to cataract surgery, and would resume both for rate-control and anticoagulation, respectively. Low pre-operative risk. Will relay on to eye specialist.

## 2013-09-17 NOTE — Telephone Encounter (Signed)
  Documentation    Stana Bunting, RN at 09/16/2013 9:55 AM    Status: Signed        Spoke w/ Sharyn Lull and advised her that pt sees Dr. Verl Blalock in [place].  Gave her fax # to send clearance request.          Sharene Butters at 09/16/2013 9:53 AM    Status: Signed        Sharyn Lull w/ Dr. Talbert Forest called looking for surgical clearance for caract surgery this Thurs 09/18/13 Please send STAT!! Fax# [phone]

## 2013-09-21 ENCOUNTER — Other Ambulatory Visit: Payer: Self-pay | Admitting: Cardiovascular Disease

## 2013-09-22 NOTE — Telephone Encounter (Signed)
Please review and refill, Thank You. 

## 2013-09-22 NOTE — Telephone Encounter (Signed)
Please review for refill. Thanks!  

## 2013-09-29 ENCOUNTER — Encounter: Payer: Self-pay | Admitting: Physician Assistant

## 2013-09-29 ENCOUNTER — Ambulatory Visit (INDEPENDENT_AMBULATORY_CARE_PROVIDER_SITE_OTHER): Payer: Medicare Other | Admitting: Physician Assistant

## 2013-09-29 VITALS — BP 186/84 | HR 84 | Ht 67.0 in | Wt 199.5 lb

## 2013-09-29 DIAGNOSIS — R0609 Other forms of dyspnea: Secondary | ICD-10-CM

## 2013-09-29 DIAGNOSIS — I2510 Atherosclerotic heart disease of native coronary artery without angina pectoris: Secondary | ICD-10-CM

## 2013-09-29 DIAGNOSIS — I4891 Unspecified atrial fibrillation: Secondary | ICD-10-CM

## 2013-09-29 DIAGNOSIS — I4821 Permanent atrial fibrillation: Secondary | ICD-10-CM

## 2013-09-29 DIAGNOSIS — I10 Essential (primary) hypertension: Secondary | ICD-10-CM

## 2013-09-29 DIAGNOSIS — R0989 Other specified symptoms and signs involving the circulatory and respiratory systems: Secondary | ICD-10-CM

## 2013-09-29 DIAGNOSIS — R609 Edema, unspecified: Secondary | ICD-10-CM

## 2013-09-29 MED ORDER — CLONIDINE HCL 0.1 MG PO TABS: 0.1000 mg | ORAL_TABLET | Freq: Two times a day (BID) | ORAL | Status: DC

## 2013-09-29 MED ORDER — HYDROCHLOROTHIAZIDE 25 MG PO TABS: 25.0000 mg | ORAL_TABLET | Freq: Every day | ORAL | Status: DC

## 2013-09-29 NOTE — Assessment & Plan Note (Signed)
Obtain 2D echo. Uncontrolled hypertension may be contributing.

## 2013-09-29 NOTE — Assessment & Plan Note (Signed)
The patient's progression of symptoms are concerning for decompensated CHF- potentially provoked by thiazide diuretic withdrawal and elevated BP. Prior 2009 echo reveals preserved EF, moderate LVH. Question diastolic physiology. Will restart HCTZ for BP and diuretic effect. Amlodipine will continue to be held- there may be a component of CVI given LE hyperpigmentation/induration on exam. Note, EVH performed on RLE but not LLE for CABG conduit. Both legs are edematous on exam. Will also add clonidine 0.1mg  BID. He will monitor BP and symptoms with these changes. Will obtain BNP, CBC, BMET and 2D echo.

## 2013-09-29 NOTE — Patient Instructions (Signed)
Please restart the hydrochlorothiazide (HCTZ) as prescribed.   Please take clonidine 0.1mg  by mouth twice a day as prescribed. This is a new blood pressure medicine.   Please take an extra Lasix for increased shortness of breath, abdominal tightness, leg swelling or weight increase.   We will check labwork today and schedule an echocardiogram (heart ultrasound) in the near future.   Please limit fluid intake to less than 2 L per day.   Please limit salt intake to less than 2 grams of sodium per day.   Please wear compression stockings/elevate legs at rest.   Ensure blood pressure is well-controlled.   Avoid excessive alcohol intake.  Weigh yourself daily. Take an extra diuretic (water pill) if weight is increasing.   Continue to monitor symptoms- shortness of breath, swelling in legs or abdomen, weight increase, reduced activity level, difficulty breathing while laying flat or waking up in the middle of the night short of breath.

## 2013-09-29 NOTE — Assessment & Plan Note (Addendum)
BP 186/84 today. 208/~100 last week. Restart HCTZ, add clonidine 0.1mg  BID.

## 2013-09-29 NOTE — Assessment & Plan Note (Signed)
Continue Toprol-XL. INR therapeutic. Continue Coumadin.

## 2013-09-29 NOTE — Progress Notes (Signed)
Patient ID: [name], male   DOB: 1928-06-15, 80 y.o.   MRN: [id number]   Date:  09/29/2013   ID:  [name], DOB 08/03/1928, MRN [id number]  PCP:  Eulas Post, MD  Primary Cardiologist:  Johnny Bridge, MD   History of Present Illness:  [name] is a 80 y.o. male w/ PMHx s/f CAD s/p CABG 17 years ago, permanent atrial fibrillation (diagnosed 3 years ago), chronic Coumadin anticoagulation, chronic RBBB, HTN and chronic LE edema.  He was seen 09/17/13 for a pre-op eval before cataract surgery. LE edema was evident on exam. This has been chronic. He denied cp, SOB/DOE, PND and orthopnea. Given edema, amlodipine was held and ACEi increased to BID dosing to compensate for BP effect. He was also on two diuretics- HCTZ and Lasix. HCTZ was held.   Since then, he states the cataract surgery went well without complication, but he experienced increased abdominal distention followed by SOB/DOE last week. No chest pain. BP was checked and found to be markedly elevated at 208/~100. He reports BP had been well-controlled on previous regimen.    Wt Readings from Last 3 Encounters:  09/29/13 90.493 kg (199 lb 8 oz)  09/17/13 90.039 kg (198 lb 8 oz)  02/19/13 86.238 kg (190 lb 1.9 oz)     Past Medical History  Diagnosis Date  . Atrial fibrillation     coumadin therapy  . Coronary artery disease     native vessel  . Hypertension   . RBBB (right bundle branch block)   . Edema     Current Outpatient Prescriptions  Medication Sig Dispense Refill  . Ascorbic Acid (VITAMIN C) 500 MG tablet Take 500 mg by mouth daily.        Marland Kitchen aspirin 81 MG tablet Take 81 mg by mouth daily.        . fish oil-omega-3 fatty acids 1000 MG capsule Take 1 g by mouth daily.        . fluticasone (FLONASE) 50 MCG/ACT nasal spray Place 2 sprays into the nose daily.      . furosemide (LASIX) 40 MG tablet TAKE 1 TABLET (40 MG TOTAL) BY MOUTH DAILY.  30 tablet  11  . glucosamine-chondroitin 500-400 MG tablet  Take 1 tablet by mouth daily.        Marland Kitchen loratadine (CLARITIN) 10 MG tablet Take 10 mg by mouth daily.      . metoprolol succinate (TOPROL-XL) 100 MG 24 hr tablet TAKE 1 TABLET (100 MG TOTAL) BY MOUTH DAILY.  30 tablet  4  . Multiple Vitamin (MULTIVITAMIN) tablet Take 1 tablet by mouth daily.        . quinapril (ACCUPRIL) 40 MG tablet Take 1 tablet (40 mg total) by mouth 2 (two) times daily.  60 tablet  3  . simvastatin (ZOCOR) 10 MG tablet TAKE ONE TABLET BY MOUTH DAILY AT BEDTIME  90 tablet  3  . Tamsulosin HCl (FLOMAX) 0.4 MG CAPS Take 0.4 mg by mouth daily.        Marland Kitchen warfarin (COUMADIN) 5 MG tablet TAKE AS DIRECTED BY COUMADIN CLINIC  90 tablet  1   No current facility-administered medications for this visit.    Allergies:    Allergies  Allergen Reactions  . Pollen Extract     Social History:  The patient  reports that he has quit smoking. He does not have any smokeless tobacco history on file. He reports that he drinks alcohol.   Family  History:  Family History  Problem Relation Age of Onset  . Other Neg Hx     Gastrointestinal Malignancy    Review of Systems: General: negative for chills, fever, night sweats or weight changes.  Cardiovascular: positive for edema, abdominal distention, DOE/SOB, negative for chest pain, orthopnea, palpitations Dermatological: negative for rash Respiratory:  negative for cough or wheezing Urologic:  negative for hematuria Abdominal:  negative for nausea, vomiting, diarrhea, bright red blood per rectum, melena, or hematemesis Neurologic:  negative for visual changes, syncope, or dizziness All other systems reviewed and are otherwise negative except as noted above.  PHYSICAL EXAM: VS:  BP 186/84  Pulse 84  Ht 5\' 7"  (1.702 m)  Wt 90.493 kg (199 lb 8 oz)  BMI 31.24 kg/m2  Well nourished, well developed, in no acute distress HEENT: normal, PERRL Neck: no JVD or bruits Cardiac:  normal S1, S2; irregularly irregular, normal rate; no murmur or  gallops Lungs:  clear to auscultation bilaterally, mildly increased respiratory effort, no wheezing, rhonchi or rales Abd: distended, nontender, no hepatomegaly, normoactive BS x 4 quads Ext: 1-2+ bilateral LE edema, bilateral LE hyperpigmentation, induration, cyanosis or clubbing Skin: warm and dry, cap refill < 2 sec Neuro:  CNs 2-12 intact, no focal abnormalities noted Musculoskeletal: strength and tone appropriate for age  Psych: normal affect

## 2013-09-29 NOTE — Assessment & Plan Note (Signed)
No chest pain. CABG ~17 years ago. Check 2D echo. Guide need for ischemic eval based on echo findings.

## 2013-09-29 NOTE — Telephone Encounter (Signed)
Pt states his BP medicine was changed when he was in here last. Over the weekend his BP" took a turn for the worse". 178/91-yesterday. States he would like to see Nicole Kindred.  Please call. This encounter was created in error - please disregard.

## 2013-09-30 ENCOUNTER — Other Ambulatory Visit: Payer: Self-pay | Admitting: Cardiovascular Disease

## 2013-10-01 ENCOUNTER — Other Ambulatory Visit: Payer: Self-pay | Admitting: Cardiovascular Disease

## 2013-10-01 ENCOUNTER — Ambulatory Visit (INDEPENDENT_AMBULATORY_CARE_PROVIDER_SITE_OTHER): Payer: Medicare Other

## 2013-10-01 DIAGNOSIS — Z7901 Long term (current) use of anticoagulants: Secondary | ICD-10-CM

## 2013-10-01 DIAGNOSIS — I4891 Unspecified atrial fibrillation: Secondary | ICD-10-CM

## 2013-10-01 LAB — POCT INR: INR: 3.2

## 2013-10-02 LAB — CBC
HCT: 42.4 % (ref 37.5–51.0)
Hemoglobin: 14.5 g/dL (ref 12.6–17.7)
MCH: 29.8 pg (ref 26.6–33.0)
MCHC: 34.2 g/dL (ref 31.5–35.7)
MCV: 87 fL (ref 79–97)
Platelets: 192 10*3/uL (ref 150–379)
RBC: 4.87 x10E6/uL (ref 4.14–5.80)
RDW: 14.5 % (ref 12.3–15.4)
WBC: 6.2 10*3/uL (ref 3.4–10.8)

## 2013-10-02 LAB — BASIC METABOLIC PANEL
BUN/Creatinine Ratio: 15 (ref 10–22)
BUN: 19 mg/dL (ref 8–27)
CO2: 22 mmol/L (ref 18–29)
Calcium: 8.9 mg/dL (ref 8.6–10.2)
Chloride: 103 mmol/L (ref 97–108)
Creatinine, Ser: 1.29 mg/dL — ABNORMAL HIGH (ref 0.76–1.27)
GFR calc non Af Amer: 51 mL/min/{1.73_m2} — ABNORMAL LOW (ref >59)
GFR, EST AFRICAN AMERICAN: 59 mL/min/{1.73_m2} — AB (ref >59)
GLUCOSE: 97 mg/dL (ref 65–99)
Potassium: 4.2 mmol/L (ref 3.5–5.2)
Sodium: 144 mmol/L (ref 134–144)

## 2013-10-02 LAB — BRAIN NATRIURETIC PEPTIDE: BNP: 395.4 pg/mL — AB (ref 0.0–100.0)

## 2013-10-03 ENCOUNTER — Telehealth: Payer: Self-pay

## 2013-10-03 DIAGNOSIS — I10 Essential (primary) hypertension: Secondary | ICD-10-CM

## 2013-10-03 NOTE — Addendum Note (Signed)
Addended by: Dede Query R on: 10/03/2013 01:19 PM   Modules accepted: Orders

## 2013-10-03 NOTE — Telephone Encounter (Signed)
Spoke w/ pt.  Advised him of Tony's recommendation.  He verbalizes understanding and is agreeable to this.

## 2013-10-03 NOTE — Telephone Encounter (Signed)
Error

## 2013-10-03 NOTE — Telephone Encounter (Signed)
Called pt to review labs and he states that his BP is still elevated in the 128N systolic despite addition of HCTZ & clonidine.  He states that the does not like the snow and that all this started w/ the bad weather. Advised pt that stress can send BP up, but he reports that he is not stressed, he just doesn't like the snow. He also states that his BP issues started around the time of his cataract surgery and his concerned that it may be r/t this. Pt states that he can come over anytime, but would like to know of any recommendations. Please advise.  Thank you!

## 2013-10-03 NOTE — Telephone Encounter (Signed)
Please increase HCTZ to 50mg  daily. Continue to monitor. If BP remains elevated despite this change, may need to increase clonidine to 0.2mg  BID vs alternative ACEi or BB. He will need a BMET on next follow-up after echo.   Thanks

## 2013-10-06 ENCOUNTER — Telehealth: Payer: Self-pay

## 2013-10-06 NOTE — Telephone Encounter (Signed)
Spoke w/ pt.  He reports that his SOB has resolved and he lost 5 lbs since Friday. Pt's BP over the weekend: Fri     191/88 Sat    195/85           167/84 Sun   194/95           140/80           196/93 Today 191/87  Please advise.  Thank you.

## 2013-10-06 NOTE — Telephone Encounter (Signed)
Please ask to increase clonidine to 0.2mg  BID and continue to monitor. Echo on 3/5 will help [place] determine appropriate medication adjustments/additions. Thx

## 2013-10-06 NOTE — Telephone Encounter (Signed)
Advised pt of [name]'s recommendation.  He is agreeable to this and will keep his echo appt on 10/09/13.

## 2013-10-09 ENCOUNTER — Other Ambulatory Visit (INDEPENDENT_AMBULATORY_CARE_PROVIDER_SITE_OTHER): Payer: Medicare Other

## 2013-10-09 ENCOUNTER — Other Ambulatory Visit: Payer: Self-pay

## 2013-10-09 DIAGNOSIS — R0989 Other specified symptoms and signs involving the circulatory and respiratory systems: Secondary | ICD-10-CM

## 2013-10-09 DIAGNOSIS — R609 Edema, unspecified: Secondary | ICD-10-CM

## 2013-10-09 DIAGNOSIS — I4891 Unspecified atrial fibrillation: Secondary | ICD-10-CM

## 2013-10-09 DIAGNOSIS — R0609 Other forms of dyspnea: Secondary | ICD-10-CM

## 2013-10-09 DIAGNOSIS — I2510 Atherosclerotic heart disease of native coronary artery without angina pectoris: Secondary | ICD-10-CM

## 2013-10-13 ENCOUNTER — Ambulatory Visit (INDEPENDENT_AMBULATORY_CARE_PROVIDER_SITE_OTHER): Payer: Medicare Other | Admitting: Cardiovascular Disease

## 2013-10-13 ENCOUNTER — Encounter: Payer: Self-pay | Admitting: Cardiovascular Disease

## 2013-10-13 VITALS — BP 140/72 | HR 52 | Ht 67.0 in | Wt 196.2 lb

## 2013-10-13 DIAGNOSIS — I2510 Atherosclerotic heart disease of native coronary artery without angina pectoris: Secondary | ICD-10-CM

## 2013-10-13 DIAGNOSIS — I4891 Unspecified atrial fibrillation: Secondary | ICD-10-CM

## 2013-10-13 DIAGNOSIS — I4821 Permanent atrial fibrillation: Secondary | ICD-10-CM

## 2013-10-13 DIAGNOSIS — I10 Essential (primary) hypertension: Secondary | ICD-10-CM

## 2013-10-13 DIAGNOSIS — I5032 Chronic diastolic (congestive) heart failure: Secondary | ICD-10-CM

## 2013-10-13 DIAGNOSIS — I509 Heart failure, unspecified: Secondary | ICD-10-CM

## 2013-10-13 MED ORDER — CLONIDINE HCL 0.2 MG PO TABS: 0.2000 mg | ORAL_TABLET | Freq: Two times a day (BID) | ORAL | Status: DC

## 2013-10-13 MED ORDER — HYDROCHLOROTHIAZIDE 50 MG PO TABS: 50.0000 mg | ORAL_TABLET | ORAL | Status: DC

## 2013-10-13 MED ORDER — QUINAPRIL HCL 40 MG PO TABS: 40.0000 mg | ORAL_TABLET | Freq: Two times a day (BID) | ORAL | Status: DC

## 2013-10-13 NOTE — Assessment & Plan Note (Signed)
Currently with no symptoms of angina. No further workup at this time. Continue current medication regimen. 

## 2013-10-13 NOTE — Assessment & Plan Note (Signed)
Blood pressure is well controlled on today's visit. No changes made to the medications. 

## 2013-10-13 NOTE — Patient Instructions (Signed)
You are doing well. No medication changes were made.  Please take HCTZ 30 minutes before the lasix in the morning If you get more shortness of breath, take an extra lasix after lunch  Monitor your weight and blood pressure  Please call us if you have new issues that need to be addressed before your next appt.  Your physician wants you to follow-up in: 6 months.  You will receive a reminder letter in the mail two months in advance. If you don't receive a letter, please call our office to schedule the follow-up appointment.

## 2013-10-13 NOTE — Assessment & Plan Note (Signed)
Encouraged him to stay on his HCTZ and Lasix. Echocardiogram showing elevated right heart pressures, pleural effusion. Feeling better with HCTZ restarted. Encouraged him to call us if he has worsening shortness of breath or cough

## 2013-10-13 NOTE — Assessment & Plan Note (Signed)
Heart rate well-controlled, tolerating warfarin

## 2013-10-13 NOTE — Progress Notes (Signed)
Patient ID: [name], male    DOB: 05-13-1928, 80 y.o.   MRN: [id number]  HPI Comments: [name] is a 80 y.o. male w/ PMHx s/f CAD s/p CABG 17 years ago, permanent atrial fibrillation (diagnosed 3 years ago), chronic Coumadin anticoagulation, chronic RBBB, HTN and chronic LE edema.  cataract surgery in February 2015 Followup in clinic February 2015 he had significant leg edema. Amlodipine was held, HCTZ also help. In followup he had severe hypertension. His clonidine was increased up to 0.2 mg twice a day, ACE inhibitor up to twice a day, HCTZ restarted at 50 mg daily On this regimen, blood pressure has been well-controlled in the 979-892 range systolic. Lower extremity edema has resolved. Overall he feels well. He did have a cough that has now resolved once HCTZ was restarted. Overall he has no new complaints  Echocardiogram was performed that showed normal LV function, atrial fibrillation, moderately elevated right ventricular systolic pressures with small to moderate sized left pleural effusion     Outpatient Encounter Prescriptions as of 10/13/2013  Medication Sig  . Ascorbic Acid (VITAMIN C) 500 MG tablet Take 500 mg by mouth daily.    Marland Kitchen aspirin 81 MG tablet Take 81 mg by mouth daily.    . cloNIDine (CATAPRES) 0.2 MG tablet Take 1 tablet (0.2 mg total) by mouth 2 (two) times daily.  . fish oil-omega-3 fatty acids 1000 MG capsule Take 1 g by mouth daily.    . fluticasone (FLONASE) 50 MCG/ACT nasal spray Place 2 sprays into the nose daily.  . furosemide (LASIX) 40 MG tablet TAKE 1 TABLET (40 MG TOTAL) BY MOUTH DAILY.  Marland Kitchen glucosamine-chondroitin 500-400 MG tablet Take 1 tablet by mouth daily.    . hydrochlorothiazide (HYDRODIURIL) 50 MG tablet Take 1 tablet (50 mg total) by mouth every morning.  . loratadine (CLARITIN) 10 MG tablet Take 10 mg by mouth daily.  . metoprolol succinate (TOPROL-XL) 100 MG 24 hr tablet TAKE 1 TABLET (100 MG TOTAL) BY MOUTH DAILY.  . Multiple Vitamin  (MULTIVITAMIN) tablet Take 1 tablet by mouth daily.    . quinapril (ACCUPRIL) 40 MG tablet Take 1 tablet (40 mg total) by mouth 2 (two) times daily.  . simvastatin (ZOCOR) 10 MG tablet TAKE ONE TABLET BY MOUTH DAILY AT BEDTIME  . Tamsulosin HCl (FLOMAX) 0.4 MG CAPS Take 0.4 mg by mouth daily.    Marland Kitchen warfarin (COUMADIN) 5 MG tablet TAKE AS DIRECTED BY COUMADIN CLINIC    Review of Systems  Constitutional: Negative.   HENT: Negative.        Dry mouth  Eyes: Negative.   Respiratory: Negative.   Cardiovascular: Negative.   Gastrointestinal: Negative.   Endocrine: Negative.   Musculoskeletal: Negative.   Skin: Negative.   Allergic/Immunologic: Negative.   Neurological: Negative.   Hematological: Negative.   Psychiatric/Behavioral: Negative.   All other systems reviewed and are negative.    BP 140/72  Pulse 52  Ht 5\' 7"  (1.702 m)  Wt 196 lb 4 oz (89.018 kg)  BMI 30.73 kg/m2  Physical Exam  Nursing note and vitals reviewed. Constitutional: He is oriented to person, place, and time. He appears well-developed and well-nourished.  HENT:  Head: Normocephalic.  Nose: Nose normal.  Mouth/Throat: Oropharynx is clear and moist.  Eyes: Conjunctivae are normal. Pupils are equal, round, and reactive to light.  Neck: Normal range of motion. Neck supple. No JVD present.  Cardiovascular: Normal rate, regular rhythm, S1 normal, S2 normal, normal heart  sounds and intact distal pulses.  Exam reveals no gallop and no friction rub.   No murmur heard. Pulmonary/Chest: Effort normal and breath sounds normal. No respiratory distress. He has no wheezes. He has no rales. He exhibits no tenderness.  Dullness at the left base up several inches  Abdominal: Soft. Bowel sounds are normal. He exhibits no distension. There is no tenderness.  Musculoskeletal: Normal range of motion. He exhibits no edema and no tenderness.  Lymphadenopathy:    He has no cervical adenopathy.  Neurological: He is alert and  oriented to person, place, and time. Coordination normal.  Skin: Skin is warm and dry. No rash noted. No erythema.  Psychiatric: He has a normal mood and affect. His behavior is normal. Judgment and thought content normal.      Assessment and Plan

## 2013-10-29 ENCOUNTER — Ambulatory Visit (INDEPENDENT_AMBULATORY_CARE_PROVIDER_SITE_OTHER): Payer: Medicare Other

## 2013-10-29 DIAGNOSIS — Z7901 Long term (current) use of anticoagulants: Secondary | ICD-10-CM

## 2013-10-29 DIAGNOSIS — I4891 Unspecified atrial fibrillation: Secondary | ICD-10-CM

## 2013-10-29 LAB — POCT INR: INR: 3

## 2013-11-26 ENCOUNTER — Ambulatory Visit (INDEPENDENT_AMBULATORY_CARE_PROVIDER_SITE_OTHER): Payer: Medicare Other

## 2013-11-26 DIAGNOSIS — I4891 Unspecified atrial fibrillation: Secondary | ICD-10-CM

## 2013-11-26 DIAGNOSIS — Z7901 Long term (current) use of anticoagulants: Secondary | ICD-10-CM

## 2013-11-26 LAB — POCT INR: INR: 3

## 2013-12-09 ENCOUNTER — Other Ambulatory Visit: Payer: Self-pay | Admitting: Cardiology

## 2013-12-12 LAB — BASIC METABOLIC PANEL
BUN: 28 mg/dL — AB (ref 4–21)
CREATININE: 1.5 mg/dL — AB (ref <=1.3)
GLUCOSE: 129 mg/dL
Potassium: 3.9 mmol/L (ref 3.4–5.3)
SODIUM: 138 mmol/L (ref 137–147)

## 2013-12-12 LAB — HEPATIC FUNCTION PANEL
ALK PHOS: 63 U/L (ref 25–125)
ALT: 23 U/L (ref 10–40)
AST: 25 U/L (ref 14–40)
Bilirubin, Total: 0.8 mg/dL

## 2013-12-12 LAB — CBC AND DIFFERENTIAL
HCT: 43 % (ref 41–53)
HEMOGLOBIN: 14.9 g/dL (ref 13.5–17.5)
Neutrophils Absolute: 67 /uL
PLATELETS: 151 10*3/uL (ref 150–399)
WBC: 5.6 10^3/mL

## 2013-12-12 LAB — LIPID PANEL
Cholesterol: 119 mg/dL (ref 0–200)
HDL: 63 mg/dL (ref 35–70)
LDL Cholesterol: 48 mg/dL
LDl/HDL Ratio: 0.8
Triglycerides: 42 mg/dL (ref 40–160)

## 2013-12-12 LAB — TSH: TSH: 2.53 u[IU]/mL (ref <=5.90)

## 2013-12-15 ENCOUNTER — Ambulatory Visit (INDEPENDENT_AMBULATORY_CARE_PROVIDER_SITE_OTHER): Payer: Medicare Other | Admitting: Cardiovascular Disease

## 2013-12-15 ENCOUNTER — Encounter: Payer: Self-pay | Admitting: Cardiovascular Disease

## 2013-12-15 VITALS — BP 142/62 | HR 51 | Ht 68.0 in | Wt 199.5 lb

## 2013-12-15 DIAGNOSIS — I5032 Chronic diastolic (congestive) heart failure: Secondary | ICD-10-CM

## 2013-12-15 DIAGNOSIS — I10 Essential (primary) hypertension: Secondary | ICD-10-CM

## 2013-12-15 DIAGNOSIS — I4891 Unspecified atrial fibrillation: Secondary | ICD-10-CM

## 2013-12-15 DIAGNOSIS — I2510 Atherosclerotic heart disease of native coronary artery without angina pectoris: Secondary | ICD-10-CM

## 2013-12-15 DIAGNOSIS — I4821 Permanent atrial fibrillation: Secondary | ICD-10-CM

## 2013-12-15 DIAGNOSIS — R0789 Other chest pain: Secondary | ICD-10-CM

## 2013-12-15 DIAGNOSIS — R0602 Shortness of breath: Secondary | ICD-10-CM

## 2013-12-15 DIAGNOSIS — I509 Heart failure, unspecified: Secondary | ICD-10-CM

## 2013-12-15 NOTE — Progress Notes (Signed)
Patient ID: [name], male    DOB: 01-16-1928, 80 y.o.   MRN: [id number]  HPI Comments: [name] is a 80 y.o. male w/ PMHx s/f CAD s/p CABG 17 years ago, permanent atrial fibrillation (diagnosed 3 years ago), chronic Coumadin anticoagulation, chronic RBBB, HTN and chronic LE edema. He presents for routine followup  cataract surgery in February 2015 Followup in clinic February 2015 he had significant leg edema. Amlodipine was held In followup he had severe hypertension. His clonidine was increased up to 0.2 mg twice a day, ACE inhibitor up to twice a day, HCTZ restarted at 50 mg daily Recently seen by Dr. Rosanna Randy and started on hydralazine 11/24/2013. He feels that this is helping his pressures  In followup today, he reports that his blood pressure has typically been well-controlled in the 120 range at home systolic. Weight is around the same in the high 190 range. Reports having an episode of shortness of breath last night, into this morning. Feels better this morning, less shortness of breath. He's been compliant with his HCTZ and Lasix every morning No significant leg edema. Continues to have trouble with his weight  Total cholesterol July 2014 129, LDL 37, HDL 82  Echocardiogram was performed that showed normal LV function, atrial fibrillation, moderately elevated right ventricular systolic pressures with small to moderate sized left pleural effusion  EKG shows atrial fibrillation with rate 51 beats per minute, right bundle branch block. Heart rate also slowed in early 2014     Outpatient Encounter Prescriptions as of 10/13/2013  Medication Sig  . Ascorbic Acid (VITAMIN C) 500 MG tablet Take 500 mg by mouth daily.    Marland Kitchen aspirin 81 MG tablet Take 81 mg by mouth daily.    . cloNIDine (CATAPRES) 0.2 MG tablet Take 1 tablet (0.2 mg total) by mouth 2 (two) times daily.  . fish oil-omega-3 fatty acids 1000 MG capsule Take 1 g by mouth daily.    . fluticasone (FLONASE) 50 MCG/ACT  nasal spray Place 2 sprays into the nose daily.  . furosemide (LASIX) 40 MG tablet TAKE 1 TABLET (40 MG TOTAL) BY MOUTH DAILY.  Marland Kitchen glucosamine-chondroitin 500-400 MG tablet Take 1 tablet by mouth daily.    . hydrochlorothiazide (HYDRODIURIL) 50 MG tablet Take 1 tablet (50 mg total) by mouth every morning.  . loratadine (CLARITIN) 10 MG tablet Take 10 mg by mouth daily.  . metoprolol succinate (TOPROL-XL) 100 MG 24 hr tablet TAKE 1 TABLET (100 MG TOTAL) BY MOUTH DAILY.  . Multiple Vitamin (MULTIVITAMIN) tablet Take 1 tablet by mouth daily.    . quinapril (ACCUPRIL) 40 MG tablet Take 1 tablet (40 mg total) by mouth 2 (two) times daily.  . simvastatin (ZOCOR) 10 MG tablet TAKE ONE TABLET BY MOUTH DAILY AT BEDTIME  . Tamsulosin HCl (FLOMAX) 0.4 MG CAPS Take 0.4 mg by mouth daily.    Marland Kitchen warfarin (COUMADIN) 5 MG tablet TAKE AS DIRECTED BY COUMADIN CLINIC    Review of Systems  Constitutional: Negative.   HENT: Negative.        Dry mouth  Eyes: Negative.   Respiratory: Negative.   Cardiovascular: Negative.   Gastrointestinal: Negative.   Endocrine: Negative.   Musculoskeletal: Negative.   Skin: Negative.   Allergic/Immunologic: Negative.   Neurological: Negative.   Hematological: Negative.   Psychiatric/Behavioral: Negative.   All other systems reviewed and are negative.   BP 142/62  Pulse 51  Ht 5\' 8"  (1.727 m)  Wt 199 lb  8 oz (90.493 kg)  BMI 30.34 kg/m2  Physical Exam  Nursing note and vitals reviewed. Constitutional: He is oriented to person, place, and time. He appears well-developed and well-nourished.  HENT:  Head: Normocephalic.  Nose: Nose normal.  Mouth/Throat: Oropharynx is clear and moist.  Eyes: Conjunctivae are normal. Pupils are equal, round, and reactive to light.  Neck: Normal range of motion. Neck supple. No JVD present.  Cardiovascular: Regular rhythm, S1 normal, S2 normal, normal heart sounds and intact distal pulses.  Bradycardia present.  Exam reveals no  gallop and no friction rub.   No murmur heard. Pulmonary/Chest: Effort normal and breath sounds normal. No respiratory distress. He has no wheezes. He has no rales. He exhibits no tenderness.  Abdominal: Soft. Bowel sounds are normal. He exhibits no distension. There is no tenderness.  Musculoskeletal: Normal range of motion. He exhibits no edema and no tenderness.  Lymphadenopathy:    He has no cervical adenopathy.  Neurological: He is alert and oriented to person, place, and time. Coordination normal.  Skin: Skin is warm and dry. No rash noted. No erythema.  Psychiatric: He has a normal mood and affect. His behavior is normal. Judgment and thought content normal.      Assessment and Plan

## 2013-12-15 NOTE — Assessment & Plan Note (Signed)
Blood pressure is well controlled on today's visit. No changes made to the medications. Doing better on hydralazine

## 2013-12-15 NOTE — Patient Instructions (Signed)
You are doing well.  Please take extra lasix as needed after lunch for shortness of breath If the shortness of  Breath does not improve with extra lasix, Call the office  Please call us if you have new issues that need to be addressed before your next appt.  Your physician wants you to follow-up in: 6 months.  You will receive a reminder letter in the mail two months in advance. If you don't receive a letter, please call our office to schedule the follow-up appointment.

## 2013-12-15 NOTE — Assessment & Plan Note (Signed)
Shortness of breath last night and this morning, better now. Suggested he take extra Lasix as needed after lunch for any recurrent shortness of breath episodes

## 2013-12-15 NOTE — Assessment & Plan Note (Signed)
Heart rate is slow. He is asymptomatic. We have suggested he watch them closely and consider decreasing his metoprolol in half for symptoms including lightheadedness, dizziness.

## 2013-12-15 NOTE — Assessment & Plan Note (Signed)
Currently with no symptoms of angina. No further workup at this time. Continue current medication regimen. We have suggested if he has more episodes of shortness of breath that did not improve with extra Lasix that he call our office for ischemia workup

## 2013-12-19 ENCOUNTER — Other Ambulatory Visit: Payer: Self-pay | Admitting: Cardiovascular Disease

## 2013-12-19 NOTE — Telephone Encounter (Signed)
Please review for refill, Thank You. 

## 2013-12-31 ENCOUNTER — Ambulatory Visit (INDEPENDENT_AMBULATORY_CARE_PROVIDER_SITE_OTHER): Payer: Self-pay

## 2013-12-31 DIAGNOSIS — I4891 Unspecified atrial fibrillation: Secondary | ICD-10-CM

## 2013-12-31 DIAGNOSIS — Z7901 Long term (current) use of anticoagulants: Secondary | ICD-10-CM

## 2013-12-31 LAB — POCT INR: INR: 2.3

## 2014-01-24 ENCOUNTER — Other Ambulatory Visit: Payer: Self-pay | Admitting: Cardiovascular Disease

## 2014-02-11 ENCOUNTER — Ambulatory Visit (INDEPENDENT_AMBULATORY_CARE_PROVIDER_SITE_OTHER): Payer: Medicare Other

## 2014-02-11 DIAGNOSIS — I4891 Unspecified atrial fibrillation: Secondary | ICD-10-CM

## 2014-02-11 DIAGNOSIS — Z7901 Long term (current) use of anticoagulants: Secondary | ICD-10-CM

## 2014-02-11 LAB — POCT INR: INR: 2.2

## 2014-02-25 ENCOUNTER — Other Ambulatory Visit: Payer: Self-pay | Admitting: Cardiovascular Disease

## 2014-03-05 ENCOUNTER — Ambulatory Visit (INDEPENDENT_AMBULATORY_CARE_PROVIDER_SITE_OTHER): Payer: Medicare Other | Admitting: Cardiovascular Disease

## 2014-03-05 ENCOUNTER — Encounter: Payer: Self-pay | Admitting: Cardiovascular Disease

## 2014-03-05 VITALS — BP 130/62 | HR 60 | Ht 67.0 in | Wt 203.0 lb

## 2014-03-05 DIAGNOSIS — R0609 Other forms of dyspnea: Secondary | ICD-10-CM

## 2014-03-05 DIAGNOSIS — Z905 Acquired absence of kidney: Secondary | ICD-10-CM | POA: Insufficient documentation

## 2014-03-05 DIAGNOSIS — N189 Chronic kidney disease, unspecified: Secondary | ICD-10-CM | POA: Insufficient documentation

## 2014-03-05 DIAGNOSIS — I509 Heart failure, unspecified: Secondary | ICD-10-CM

## 2014-03-05 DIAGNOSIS — I4891 Unspecified atrial fibrillation: Secondary | ICD-10-CM

## 2014-03-05 DIAGNOSIS — N289 Disorder of kidney and ureter, unspecified: Secondary | ICD-10-CM | POA: Insufficient documentation

## 2014-03-05 DIAGNOSIS — I5032 Chronic diastolic (congestive) heart failure: Secondary | ICD-10-CM

## 2014-03-05 DIAGNOSIS — I2510 Atherosclerotic heart disease of native coronary artery without angina pectoris: Secondary | ICD-10-CM

## 2014-03-05 DIAGNOSIS — R0602 Shortness of breath: Secondary | ICD-10-CM

## 2014-03-05 DIAGNOSIS — R0989 Other specified symptoms and signs involving the circulatory and respiratory systems: Secondary | ICD-10-CM

## 2014-03-05 DIAGNOSIS — I4821 Permanent atrial fibrillation: Secondary | ICD-10-CM

## 2014-03-05 DIAGNOSIS — R609 Edema, unspecified: Secondary | ICD-10-CM

## 2014-03-05 MED ORDER — TORSEMIDE 20 MG PO TABS: 20.0000 mg | ORAL_TABLET | Freq: Two times a day (BID) | ORAL | Status: DC | PRN

## 2014-03-05 NOTE — Patient Instructions (Signed)
When you run out of lasix, Start torsemide one pill daily If needed, you can take torsemide up to twice a day (for leg swelling, shortness of breath, and cough) Goal weigh low 190s  We will make an appt with renal doctor  Please call us if you have new issues that need to be addressed before your next appt.  Your physician wants you to follow-up in: 1 month.

## 2014-03-05 NOTE — Assessment & Plan Note (Signed)
Weight continues to trend upwards, now 203 pounds which is 4-5 pounds heavier than may 2015. We will try torsemide as detailed. May need renal followup given he only has one kidney and we are using very aggressive diuretics

## 2014-03-05 NOTE — Assessment & Plan Note (Signed)
Shortness of breath mildly better with Lasix 40 mg twice a day. He would prefer something more aggressive. We will try to torsemide 20 mg daily, up to 20 mg twice a day for several pound weight loss. Symptoms likely from acute on chronic diastolic CHF, pulmonary hypertension in the setting of renal dysfunction

## 2014-03-05 NOTE — Progress Notes (Signed)
Patient ID: [name], male    DOB: 09-08-1927, 80 y.o.   MRN: [id number]  HPI Comments: [name] is a 80 y.o. male w/ PMHx s/f CAD s/p CABG 17 years ago, permanent atrial fibrillation, chronic Coumadin anticoagulation, chronic RBBB, HTN and chronic LE edema. He presents for routine followup He has a single kidney. Reports that Dr. Eliberto Ivory for that his kidney some time ago. Details unclear.   Seen in clinic February 2015 at which time he had  significant leg edema. Amlodipine was held Echocardiogram around that time showed normal LV systolic function, moderate pulmonary hypertension with pleural effusion on the left Also had severe hypertension. His clonidine was increased up to 0.2 mg twice a day, ACE inhibitor up to twice a day, HCTZ restarted at 50 mg daily  started on hydralazine 11/24/2013 by Dr. Rosanna Randy.   In followup today, blood pressures continue to be well controlled on his current medication regimen. Reports having weight gain, worsening leg edema, abdominal bloating, cough. Cough feels like sputum stuck in his throat. Denies having any green or yellow sputum concerning for infection. Ideal weight is typically 189 at home, recently running 196 Weight in the clinic today is 203 which is the highest he has ever been. Typically running in the mid to high 190s.  We did receive a call that he was gaining weight and we suggested he take Lasix 40 mg twice a day. He has done this over the past several days reports having improve shortness of breath, less abdominal swelling. Still has cough and fluid  Total cholesterol July 2014 129, LDL 37, HDL 82  EKG shows atrial fibrillation with rate 60 beats per minute, right bundle branch block.      Outpatient Encounter Prescriptions as of 03/05/2014  Medication Sig  . Ascorbic Acid (VITAMIN C) 500 MG tablet Take 500 mg by mouth daily.    Marland Kitchen aspirin 81 MG tablet Take 81 mg by mouth daily.    . cloNIDine (CATAPRES) 0.2 MG tablet Take 1  tablet (0.2 mg total) by mouth 2 (two) times daily.  . fish oil-omega-3 fatty acids 1000 MG capsule Take 1 g by mouth daily.    . furosemide (LASIX) 40 MG tablet TAKE 1 TABLET (40 MG TOTAL) BY MOUTH DAILY.  Marland Kitchen glucosamine-chondroitin 500-400 MG tablet Take 1 tablet by mouth daily.    . hydrALAZINE (APRESOLINE) 25 MG tablet Take 25 mg by mouth 2 (two) times daily.   . hydrochlorothiazide (HYDRODIURIL) 50 MG tablet Take 1 tablet (50 mg total) by mouth every morning.  . loratadine (CLARITIN) 10 MG tablet Take 10 mg by mouth daily.  . Melatonin 10 MG CAPS Take 10 mg by mouth daily.  . metoprolol succinate (TOPROL-XL) 100 MG 24 hr tablet TAKE 1 TABLET (100 MG TOTAL) BY MOUTH DAILY.  . Multiple Vitamin (MULTIVITAMIN) tablet Take 1 tablet by mouth daily.    . quinapril (ACCUPRIL) 40 MG tablet Take 1 tablet (40 mg total) by mouth 2 (two) times daily.  . simvastatin (ZOCOR) 10 MG tablet TAKE ONE TABLET BY MOUTH DAILY AT BEDTIME  . Tamsulosin HCl (FLOMAX) 0.4 MG CAPS Take 0.4 mg by mouth daily.    Marland Kitchen warfarin (COUMADIN) 5 MG tablet TAKE AS DIRECTED BY COUMADIN CLINIC   Review of Systems  Constitutional: Negative.   HENT: Negative.        Dry mouth  Eyes: Negative.   Respiratory: Positive for cough and shortness of breath.   Cardiovascular: Positive  for leg swelling.  Gastrointestinal: Positive for abdominal distention.  Endocrine: Negative.   Musculoskeletal: Negative.   Skin: Negative.   Allergic/Immunologic: Negative.   Neurological: Negative.   Hematological: Negative.   Psychiatric/Behavioral: Negative.   All other systems reviewed and are negative.   BP 130/62  Pulse 60  Ht 5\' 7"  (1.702 m)  Wt 203 lb (92.08 kg)  BMI 31.79 kg/m2  Physical Exam  Nursing note and vitals reviewed. Constitutional: He is oriented to person, place, and time. He appears well-developed and well-nourished.  HENT:  Head: Normocephalic.  Nose: Nose normal.  Mouth/Throat: Oropharynx is clear and moist.   Eyes: Conjunctivae are normal. Pupils are equal, round, and reactive to light.  Neck: Normal range of motion. Neck supple. No JVD present.  Cardiovascular: Regular rhythm, S1 normal, S2 normal, normal heart sounds and intact distal pulses.  Bradycardia present.  Exam reveals no gallop and no friction rub.   No murmur heard. Pulmonary/Chest: Effort normal and breath sounds normal. No respiratory distress. He has no wheezes. He has no rales. He exhibits no tenderness.  Abdominal: Soft. Bowel sounds are normal. He exhibits no distension. There is no tenderness.  Musculoskeletal: Normal range of motion. He exhibits no edema and no tenderness.  Lymphadenopathy:    He has no cervical adenopathy.  Neurological: He is alert and oriented to person, place, and time. Coordination normal.  Skin: Skin is warm and dry. No rash noted. No erythema.  Psychiatric: He has a normal mood and affect. His behavior is normal. Judgment and thought content normal.      Assessment and Plan

## 2014-03-05 NOTE — Assessment & Plan Note (Signed)
Reports history of nephrectomy performed by Dr. Rogers Blocker in the past. She may benefit from followup with nephrology for close observation given his worsening pulmonary hypertension and diastolic heart failure

## 2014-03-05 NOTE — Assessment & Plan Note (Signed)
Continued pitting edema on today's visit, weight gain. He reports that Lasix does not seem to be working for him as it did in the past. We have suggested he try torsemide 20 mg twice a day for several days,  decreasing the torsemide down to daily dosing for more than 5 pound weight loss . This would put him back to his baseline weight

## 2014-03-05 NOTE — Assessment & Plan Note (Signed)
Currently with no symptoms of angina. No further workup at this time. Continue current medication regimen. 

## 2014-03-05 NOTE — Assessment & Plan Note (Signed)
Creatinine mildly elevated earlier this year. We're using aggressive diuresis for his diastolic CHF symptoms. Expressed to him that he would likely need close nephrology followup

## 2014-03-05 NOTE — Assessment & Plan Note (Signed)
Tolerating anticoagulation. Heart rate well-controlled. This is likely contributing to his diastolic CHF

## 2014-03-06 ENCOUNTER — Telehealth: Payer: Self-pay

## 2014-03-06 DIAGNOSIS — N189 Chronic kidney disease, unspecified: Secondary | ICD-10-CM

## 2014-03-06 NOTE — Telephone Encounter (Signed)
Spoke w/ pt.  Advised him that he has an appt sched w/ Dr. Juleen China @ [place] on Wed. 03/18/14 @ 8:20. Gave pt contact info in case this time does not work out for him.  Asked pt to call w/ any questions or concerns.

## 2014-03-11 ENCOUNTER — Other Ambulatory Visit: Payer: Self-pay | Admitting: Cardiology

## 2014-03-18 ENCOUNTER — Other Ambulatory Visit: Payer: Self-pay | Admitting: Cardiovascular Disease

## 2014-03-19 NOTE — Telephone Encounter (Signed)
Please review for refill. Thanks!  

## 2014-03-25 ENCOUNTER — Ambulatory Visit (INDEPENDENT_AMBULATORY_CARE_PROVIDER_SITE_OTHER): Payer: Medicare Other

## 2014-03-25 DIAGNOSIS — Z7901 Long term (current) use of anticoagulants: Secondary | ICD-10-CM

## 2014-03-25 DIAGNOSIS — I4891 Unspecified atrial fibrillation: Secondary | ICD-10-CM

## 2014-03-25 LAB — POCT INR: INR: 3.4

## 2014-04-15 ENCOUNTER — Ambulatory Visit (INDEPENDENT_AMBULATORY_CARE_PROVIDER_SITE_OTHER): Payer: Medicare Other

## 2014-04-15 DIAGNOSIS — I4891 Unspecified atrial fibrillation: Secondary | ICD-10-CM

## 2014-04-15 DIAGNOSIS — Z7901 Long term (current) use of anticoagulants: Secondary | ICD-10-CM

## 2014-04-15 LAB — POCT INR: INR: 3

## 2014-04-16 ENCOUNTER — Encounter: Payer: Self-pay | Admitting: Cardiovascular Disease

## 2014-04-16 ENCOUNTER — Ambulatory Visit (INDEPENDENT_AMBULATORY_CARE_PROVIDER_SITE_OTHER): Payer: Medicare Other | Admitting: Cardiovascular Disease

## 2014-04-16 VITALS — BP 98/50 | HR 63 | Ht 66.0 in | Wt 196.2 lb

## 2014-04-16 DIAGNOSIS — Z7901 Long term (current) use of anticoagulants: Secondary | ICD-10-CM

## 2014-04-16 DIAGNOSIS — N183 Chronic kidney disease, stage 3 unspecified: Secondary | ICD-10-CM

## 2014-04-16 DIAGNOSIS — R0602 Shortness of breath: Secondary | ICD-10-CM

## 2014-04-16 DIAGNOSIS — I4821 Permanent atrial fibrillation: Secondary | ICD-10-CM

## 2014-04-16 DIAGNOSIS — I5032 Chronic diastolic (congestive) heart failure: Secondary | ICD-10-CM

## 2014-04-16 DIAGNOSIS — I509 Heart failure, unspecified: Secondary | ICD-10-CM

## 2014-04-16 DIAGNOSIS — I2510 Atherosclerotic heart disease of native coronary artery without angina pectoris: Secondary | ICD-10-CM

## 2014-04-16 DIAGNOSIS — R609 Edema, unspecified: Secondary | ICD-10-CM

## 2014-04-16 DIAGNOSIS — I4891 Unspecified atrial fibrillation: Secondary | ICD-10-CM

## 2014-04-16 NOTE — Assessment & Plan Note (Addendum)
Dramatic improvement in his condition over the past 2 months Weight is down, leg edema has resolved We have recommended he follow the rest of the below  Goal weight at home is 187 to 190 For weight less than 186, only take one lasix For weight less than 184, no lasix  For weight 195, call the office

## 2014-04-16 NOTE — Assessment & Plan Note (Signed)
Heart rate relatively well-controlled on his current medication regimen.

## 2014-04-16 NOTE — Assessment & Plan Note (Signed)
Currently with no symptoms of angina. No further workup at this time. Continue current medication regimen. 

## 2014-04-16 NOTE — Progress Notes (Signed)
Patient ID: OZ GAMMEL, male    DOB: 13-Sep-1927, 80 y.o.   MRN: [id number]  HPI Comments: [name] is a 80 y.o. male w/ PMHx s/f CAD s/p CABG 17 years ago, permanent atrial fibrillation, chronic Coumadin anticoagulation, chronic RBBB, HTN and chronic LE edema. He presents for routine followup He has a single kidney.   Seen in clinic February 2015 at which time he had  significant leg edema. Amlodipine was held Echocardiogram around that time showed normal LV systolic function, moderate pulmonary hypertension with pleural effusion on the left Also had severe hypertension.   Since then his Lasix was increased up to 40 mg twice a day He has had dramatic weight loss on aggressive diuresis. He reports having resolution of his leg edema, breathing is back to normal. At home his weight is in the high 180 range In our office weight is down from 203 pounds last month down to 197 pounds today In general he feels back to his baseline, feels good By his report, recent renal function last week was essentially unchanged even with aggressive diuresis. Prior creatinine 1.78 with BUN 37 03/18/2014  Total cholesterol July 2014 129, LDL 37, HDL 82  EKG shows atrial fibrillation with rate 60 beats per minute, right bundle branch block.      Outpatient Encounter Prescriptions as of 04/16/2014  Medication Sig  . Ascorbic Acid (VITAMIN C) 500 MG tablet Take 500 mg by mouth daily.    Marland Kitchen aspirin 81 MG tablet Take 81 mg by mouth daily.    . cloNIDine (CATAPRES) 0.2 MG tablet Take 1 tablet (0.2 mg total) by mouth 2 (two) times daily.  . fish oil-omega-3 fatty acids 1000 MG capsule Take 1 g by mouth daily.    . furosemide (LASIX) 40 MG tablet TAKE 1 TABLET (40 MG TOTAL) BY MOUTH DAILY.  Marland Kitchen glucosamine-chondroitin 500-400 MG tablet Take 1 tablet by mouth daily.    . hydrALAZINE (APRESOLINE) 25 MG tablet Take 25 mg by mouth 2 (two) times daily.   Marland Kitchen loratadine (CLARITIN) 10 MG tablet Take 10 mg by mouth  daily.  . Melatonin 10 MG CAPS Take 10 mg by mouth daily.  . metoprolol succinate (TOPROL-XL) 100 MG 24 hr tablet TAKE 1 TABLET (100 MG TOTAL) BY MOUTH DAILY.  . Multiple Vitamin (MULTIVITAMIN) tablet Take 1 tablet by mouth daily.    . quinapril (ACCUPRIL) 40 MG tablet Take 1 tablet (40 mg total) by mouth 2 (two) times daily.  . simvastatin (ZOCOR) 10 MG tablet TAKE ONE TABLET BY MOUTH DAILY AT BEDTIME  . spironolactone (ALDACTONE) 25 MG tablet Take 25 mg by mouth daily.  . Tamsulosin HCl (FLOMAX) 0.4 MG CAPS Take 0.4 mg by mouth daily.    Marland Kitchen torsemide (DEMADEX) 20 MG tablet Take 1 tablet (20 mg total) by mouth 2 (two) times daily as needed.  . warfarin (COUMADIN) 5 MG tablet TAKE AS DIRECTED BY COUMADIN CLINIC   Review of Systems  Constitutional: Negative.   HENT: Negative.        Dry mouth  Eyes: Negative.   Respiratory: Negative.   Cardiovascular: Negative.   Gastrointestinal: Negative.   Endocrine: Negative.   Musculoskeletal: Negative.   Skin: Negative.   Allergic/Immunologic: Negative.   Neurological: Negative.   Hematological: Negative.   Psychiatric/Behavioral: Negative.   All other systems reviewed and are negative.   BP 98/50  Pulse 63  Ht 5\' 6"  (1.676 m)  Wt 196 lb 4 oz (89.018 kg)  BMI 31.69 kg/m2 Blood pressure 629 systolic on my recheck Physical Exam  Nursing note and vitals reviewed. Constitutional: He is oriented to person, place, and time. He appears well-developed and well-nourished.  HENT:  Head: Normocephalic.  Nose: Nose normal.  Mouth/Throat: Oropharynx is clear and moist.  Eyes: Conjunctivae are normal. Pupils are equal, round, and reactive to light.  Neck: Normal range of motion. Neck supple. No JVD present.  Cardiovascular: S1 normal, S2 normal, normal heart sounds and intact distal pulses.  An irregularly irregular rhythm present. Bradycardia present.  Exam reveals no gallop and no friction rub.   No murmur heard. Pulmonary/Chest: Effort normal  and breath sounds normal. No respiratory distress. He has no wheezes. He has no rales. He exhibits no tenderness.  Abdominal: Soft. Bowel sounds are normal. He exhibits no distension. There is no tenderness.  Musculoskeletal: Normal range of motion. He exhibits no edema and no tenderness.  Lymphadenopathy:    He has no cervical adenopathy.  Neurological: He is alert and oriented to person, place, and time. Coordination normal.  Skin: Skin is warm and dry. No rash noted. No erythema.  Psychiatric: He has a normal mood and affect. His behavior is normal. Judgment and thought content normal.      Assessment and Plan

## 2014-04-16 NOTE — Patient Instructions (Addendum)
You are doing well. Please hold the hydralazine, blood pressure is low  Goal weight at home is 187 to 190 For weight less than 186, only take one lasix For weight less than 184, no lasix   For weight 195, call the office   Please call us if you have new issues that need to be addressed before your next appt.  Your physician wants you to follow-up in: 3 months.  You will receive a reminder letter in the mail two months in advance. If you don't receive a letter, please call our office to schedule the follow-up appointment.

## 2014-04-16 NOTE — Assessment & Plan Note (Signed)
Followed by Dr. Abigail Butts. By his report, renal function stable

## 2014-04-16 NOTE — Assessment & Plan Note (Signed)
Tolerating warfarin with no bleeding complications

## 2014-04-16 NOTE — Assessment & Plan Note (Signed)
Edema has essentially resolved. Some trace swelling likely from venous insufficiency

## 2014-05-13 ENCOUNTER — Ambulatory Visit (INDEPENDENT_AMBULATORY_CARE_PROVIDER_SITE_OTHER): Payer: Medicare Other

## 2014-05-13 DIAGNOSIS — Z7901 Long term (current) use of anticoagulants: Secondary | ICD-10-CM

## 2014-05-13 DIAGNOSIS — I4891 Unspecified atrial fibrillation: Secondary | ICD-10-CM

## 2014-05-13 LAB — POCT INR: INR: 3.7

## 2014-06-03 ENCOUNTER — Ambulatory Visit (INDEPENDENT_AMBULATORY_CARE_PROVIDER_SITE_OTHER): Payer: Medicare Other

## 2014-06-03 DIAGNOSIS — Z7901 Long term (current) use of anticoagulants: Secondary | ICD-10-CM

## 2014-06-03 DIAGNOSIS — I4891 Unspecified atrial fibrillation: Secondary | ICD-10-CM

## 2014-06-03 LAB — POCT INR: INR: 2.4

## 2014-07-06 ENCOUNTER — Other Ambulatory Visit: Payer: Self-pay | Admitting: Cardiovascular Disease

## 2014-07-08 ENCOUNTER — Ambulatory Visit (INDEPENDENT_AMBULATORY_CARE_PROVIDER_SITE_OTHER): Payer: Medicare Other

## 2014-07-08 DIAGNOSIS — I4891 Unspecified atrial fibrillation: Secondary | ICD-10-CM

## 2014-07-08 DIAGNOSIS — Z7901 Long term (current) use of anticoagulants: Secondary | ICD-10-CM

## 2014-07-08 LAB — POCT INR: INR: 3.3

## 2014-07-14 LAB — HEMOGLOBIN A1C: HEMOGLOBIN A1C: 6.2 % — AB (ref 4.0–6.0)

## 2014-07-16 ENCOUNTER — Encounter: Payer: Self-pay | Admitting: Cardiovascular Disease

## 2014-07-16 ENCOUNTER — Ambulatory Visit (INDEPENDENT_AMBULATORY_CARE_PROVIDER_SITE_OTHER): Payer: Medicare Other | Admitting: Cardiovascular Disease

## 2014-07-16 VITALS — BP 104/58 | HR 56 | Ht 66.0 in | Wt 198.8 lb

## 2014-07-16 DIAGNOSIS — R609 Edema, unspecified: Secondary | ICD-10-CM

## 2014-07-16 DIAGNOSIS — I4821 Permanent atrial fibrillation: Secondary | ICD-10-CM

## 2014-07-16 DIAGNOSIS — I10 Essential (primary) hypertension: Secondary | ICD-10-CM

## 2014-07-16 DIAGNOSIS — N183 Chronic kidney disease, stage 3 unspecified: Secondary | ICD-10-CM

## 2014-07-16 DIAGNOSIS — I2510 Atherosclerotic heart disease of native coronary artery without angina pectoris: Secondary | ICD-10-CM

## 2014-07-16 DIAGNOSIS — I482 Chronic atrial fibrillation: Secondary | ICD-10-CM

## 2014-07-16 DIAGNOSIS — I5032 Chronic diastolic (congestive) heart failure: Secondary | ICD-10-CM

## 2014-07-16 NOTE — Assessment & Plan Note (Signed)
Blood pressure well controlled. No medication changes made

## 2014-07-16 NOTE — Assessment & Plan Note (Signed)
Heart rate well-controlled on his current medication regimen. Tolerating anticoagulation 

## 2014-07-16 NOTE — Assessment & Plan Note (Signed)
No recurrence of his edema after holding amlodipine.

## 2014-07-16 NOTE — Progress Notes (Signed)
Patient ID: [name], male    DOB: [birth year], 80 y.o.   MRN: [id number]  HPI Comments: [name] is a 80 y.o. male w/ PMHx s/f CAD s/p CABG 17 years ago, permanent atrial fibrillation, chronic Coumadin anticoagulation, chronic RBBB, HTN and chronic LE edema. He presents for routine followup of his coronary artery disease He has a single kidney.   In follow-up today, he reports that he is doing well. He denies any significant shortness of breath or chest pain with exertion. He is taking torsemide 20 g daily, occasionally takes an extra dose in the afternoon for weight gain Weight is stable around 197 pounds Blood pressure at home typically 154-008 systolic Hemoglobin Q7Y 6.2, total cholesterol in 2014 was 129 No lower extremity edema. Lab work from August 2015 showing creatinine 1.45, BUN 24, seen by Dr. Abigail Butts, renal service  EKG on today's visit shows atrial fibrillation with rate 56 bpm, right bundle branch block, nonspecific T wave abnormality anterolateral leads  Other past medical history Seen in clinic February 2015 at which time he had  significant leg edema. Amlodipine was held Echocardiogram around that time showed normal LV systolic function, moderate pulmonary hypertension with pleural effusion on the left Also had severe hypertension.     Allergies  Allergen Reactions  . Pollen Extract     Outpatient Encounter Prescriptions as of 07/16/2014  Medication Sig  . Ascorbic Acid (VITAMIN C) 500 MG tablet Take 500 mg by mouth daily.    Marland Kitchen aspirin 81 MG tablet Take 81 mg by mouth daily.    . cloNIDine (CATAPRES) 0.2 MG tablet TAKE 1 TABLET (0.2 MG TOTAL) BY MOUTH 2 (TWO) TIMES DAILY.  . fish oil-omega-3 fatty acids 1000 MG capsule Take 1 g by mouth daily.    Marland Kitchen glucosamine-chondroitin 500-400 MG tablet Take 1 tablet by mouth daily.    Marland Kitchen loratadine (CLARITIN) 10 MG tablet Take 10 mg by mouth daily.  . metoprolol succinate (TOPROL-XL) 100 MG 24 hr tablet TAKE 1  TABLET (100 MG TOTAL) BY MOUTH DAILY.  . Multiple Vitamin (MULTIVITAMIN) tablet Take 1 tablet by mouth daily.    . quinapril (ACCUPRIL) 40 MG tablet Take 1 tablet (40 mg total) by mouth 2 (two) times daily.  . simvastatin (ZOCOR) 10 MG tablet TAKE ONE TABLET BY MOUTH DAILY AT BEDTIME  . spironolactone (ALDACTONE) 25 MG tablet Take 25 mg by mouth daily.  . Tamsulosin HCl (FLOMAX) 0.4 MG CAPS Take 0.4 mg by mouth daily.    Marland Kitchen torsemide (DEMADEX) 20 MG tablet Take 1 tablet (20 mg total) by mouth 2 (two) times daily as needed.  . warfarin (COUMADIN) 5 MG tablet TAKE AS DIRECTED BY COUMADIN CLINIC  . [DISCONTINUED] furosemide (LASIX) 40 MG tablet TAKE 1 TABLET (40 MG TOTAL) BY MOUTH DAILY. (Patient not taking: Reported on 07/16/2014)  . [DISCONTINUED] hydrALAZINE (APRESOLINE) 25 MG tablet Take 25 mg by mouth 2 (two) times daily.   . [DISCONTINUED] Melatonin 10 MG CAPS Take 10 mg by mouth daily.    Past Medical History  Diagnosis Date  . Atrial fibrillation     coumadin therapy  . Coronary artery disease     native vessel  . Hypertension   . RBBB (right bundle branch block)   . Edema   . Squamous cell carcinoma     left hand & right ear     Past Surgical History  Procedure Laterality Date  . Cardioversion  09/21/08  . Appendectomy    .  Squamous cell carcinoma excision      left hand & right ear.     Social History  reports that he has quit smoking. He does not have any smokeless tobacco history on file. He reports that he drinks alcohol. He reports that he does not use illicit drugs.  Family History family history includes Heart attack in his father. There is no history of Other.       Review of Systems  Constitutional: Negative.   HENT: Negative.        Dry mouth  Eyes: Negative.   Respiratory: Negative.   Cardiovascular: Negative.   Gastrointestinal: Negative.   Endocrine: Negative.   Musculoskeletal: Negative.   Skin: Negative.   Allergic/Immunologic: Negative.    Neurological: Negative.   Hematological: Negative.   Psychiatric/Behavioral: Negative.   All other systems reviewed and are negative.   BP 104/58 mmHg  Pulse 56  Ht 5\' 6"  (1.676 m)  Wt 198 lb 12 oz (90.152 kg)  BMI 32.09 kg/m2 Repeat blood pressure 115/60  Physical Exam  Constitutional: He is oriented to person, place, and time. He appears well-developed and well-nourished.  Obese  HENT:  Head: Normocephalic.  Nose: Nose normal.  Mouth/Throat: Oropharynx is clear and moist.  Eyes: Conjunctivae are normal. Pupils are equal, round, and reactive to light.  Neck: Normal range of motion. Neck supple. No JVD present.  Cardiovascular: Normal rate, regular rhythm, S1 normal, S2 normal, normal heart sounds and intact distal pulses.  An irregularly irregular rhythm present. Exam reveals no gallop and no friction rub.   No murmur heard. Pulmonary/Chest: Effort normal and breath sounds normal. No respiratory distress. He has no wheezes. He has no rales. He exhibits no tenderness.  Abdominal: Soft. Bowel sounds are normal. He exhibits no distension. There is no tenderness.  Musculoskeletal: Normal range of motion. He exhibits no edema or tenderness.  Lymphadenopathy:    He has no cervical adenopathy.  Neurological: He is alert and oriented to person, place, and time. Coordination normal.  Skin: Skin is warm and dry. No rash noted. No erythema.  Psychiatric: He has a normal mood and affect. His behavior is normal. Judgment and thought content normal.      Assessment and Plan   Nursing note and vitals reviewed.

## 2014-07-16 NOTE — Patient Instructions (Signed)
You are doing well. No medication changes were made.  Please call us if you have new issues that need to be addressed before your next appt.  Your physician wants you to follow-up in: 6 months.  You will receive a reminder letter in the mail two months in advance. If you don't receive a letter, please call our office to schedule the follow-up appointment.   

## 2014-07-16 NOTE — Assessment & Plan Note (Signed)
Weight is essentially stable. No changes to his regiment. Weight proximal body 197 pounds fully dressed

## 2014-07-16 NOTE — Assessment & Plan Note (Signed)
Currently with no symptoms of angina. No further workup at this time. Continue current medication regimen. 

## 2014-07-16 NOTE — Assessment & Plan Note (Signed)
Recent creatinine 1.45. Suspect this may climb with overdiuresis He is followed by Dr. Abigail Butts

## 2014-07-22 ENCOUNTER — Other Ambulatory Visit: Payer: Self-pay | Admitting: Cardiovascular Disease

## 2014-07-29 ENCOUNTER — Other Ambulatory Visit: Payer: Self-pay | Admitting: Cardiovascular Disease

## 2014-08-05 ENCOUNTER — Ambulatory Visit (INDEPENDENT_AMBULATORY_CARE_PROVIDER_SITE_OTHER): Payer: Medicare Other

## 2014-08-05 DIAGNOSIS — Z7901 Long term (current) use of anticoagulants: Secondary | ICD-10-CM

## 2014-08-05 DIAGNOSIS — I4891 Unspecified atrial fibrillation: Secondary | ICD-10-CM

## 2014-08-05 LAB — POCT INR: INR: 3.2

## 2014-08-13 ENCOUNTER — Other Ambulatory Visit: Payer: Self-pay

## 2014-08-13 MED ORDER — SPIRONOLACTONE 25 MG PO TABS: 25.0000 mg | ORAL_TABLET | Freq: Every day | ORAL | Status: DC

## 2014-08-13 NOTE — Telephone Encounter (Signed)
Refill sent for spironolactone 25 mg

## 2014-09-02 ENCOUNTER — Other Ambulatory Visit: Payer: Self-pay | Admitting: Cardiovascular Disease

## 2014-09-02 ENCOUNTER — Ambulatory Visit (INDEPENDENT_AMBULATORY_CARE_PROVIDER_SITE_OTHER): Payer: PPO | Admitting: Pharmacist

## 2014-09-02 DIAGNOSIS — I4891 Unspecified atrial fibrillation: Secondary | ICD-10-CM

## 2014-09-02 DIAGNOSIS — Z7901 Long term (current) use of anticoagulants: Secondary | ICD-10-CM

## 2014-09-02 LAB — POCT INR: INR: 3.1

## 2014-09-07 ENCOUNTER — Other Ambulatory Visit: Payer: Self-pay | Admitting: Cardiovascular Disease

## 2014-09-23 ENCOUNTER — Ambulatory Visit (INDEPENDENT_AMBULATORY_CARE_PROVIDER_SITE_OTHER): Payer: PPO

## 2014-09-23 DIAGNOSIS — Z7901 Long term (current) use of anticoagulants: Secondary | ICD-10-CM

## 2014-09-23 DIAGNOSIS — I4891 Unspecified atrial fibrillation: Secondary | ICD-10-CM

## 2014-09-23 LAB — POCT INR: INR: 2.1

## 2014-10-06 ENCOUNTER — Encounter: Payer: Self-pay | Admitting: Cardiovascular Disease

## 2014-10-28 ENCOUNTER — Ambulatory Visit (INDEPENDENT_AMBULATORY_CARE_PROVIDER_SITE_OTHER): Payer: PPO

## 2014-10-28 DIAGNOSIS — Z7901 Long term (current) use of anticoagulants: Secondary | ICD-10-CM | POA: Diagnosis not present

## 2014-10-28 DIAGNOSIS — I4891 Unspecified atrial fibrillation: Secondary | ICD-10-CM

## 2014-10-28 LAB — POCT INR: INR: 2

## 2014-12-02 ENCOUNTER — Ambulatory Visit (INDEPENDENT_AMBULATORY_CARE_PROVIDER_SITE_OTHER): Payer: PPO | Admitting: *Deleted

## 2014-12-02 DIAGNOSIS — I4891 Unspecified atrial fibrillation: Secondary | ICD-10-CM | POA: Diagnosis not present

## 2014-12-02 DIAGNOSIS — Z7901 Long term (current) use of anticoagulants: Secondary | ICD-10-CM | POA: Diagnosis not present

## 2014-12-02 LAB — POCT INR: INR: 2.1

## 2014-12-10 DIAGNOSIS — M1990 Unspecified osteoarthritis, unspecified site: Secondary | ICD-10-CM | POA: Insufficient documentation

## 2014-12-10 DIAGNOSIS — E119 Type 2 diabetes mellitus without complications: Secondary | ICD-10-CM | POA: Insufficient documentation

## 2014-12-10 DIAGNOSIS — I4891 Unspecified atrial fibrillation: Secondary | ICD-10-CM | POA: Insufficient documentation

## 2014-12-10 DIAGNOSIS — I509 Heart failure, unspecified: Secondary | ICD-10-CM | POA: Insufficient documentation

## 2014-12-10 DIAGNOSIS — K219 Gastro-esophageal reflux disease without esophagitis: Secondary | ICD-10-CM | POA: Insufficient documentation

## 2014-12-10 DIAGNOSIS — E785 Hyperlipidemia, unspecified: Secondary | ICD-10-CM | POA: Insufficient documentation

## 2014-12-10 DIAGNOSIS — J309 Allergic rhinitis, unspecified: Secondary | ICD-10-CM | POA: Insufficient documentation

## 2014-12-10 DIAGNOSIS — I10 Essential (primary) hypertension: Secondary | ICD-10-CM | POA: Insufficient documentation

## 2014-12-10 DIAGNOSIS — L570 Actinic keratosis: Secondary | ICD-10-CM | POA: Insufficient documentation

## 2014-12-21 ENCOUNTER — Ambulatory Visit
Admission: RE | Admit: 2014-12-21 | Discharge: 2014-12-21 | Disposition: A | Discharge status: Home / Self Care | Payer: PPO | Source: Ambulatory Visit | Attending: Family Medicine | Admitting: Family Medicine

## 2014-12-21 ENCOUNTER — Other Ambulatory Visit: Payer: Self-pay | Admitting: Family Medicine

## 2014-12-21 DIAGNOSIS — R05 Cough: Secondary | ICD-10-CM

## 2014-12-21 DIAGNOSIS — J90 Pleural effusion, not elsewhere classified: Secondary | ICD-10-CM | POA: Diagnosis not present

## 2014-12-21 DIAGNOSIS — R059 Cough, unspecified: Secondary | ICD-10-CM

## 2014-12-21 DIAGNOSIS — I517 Cardiomegaly: Secondary | ICD-10-CM | POA: Insufficient documentation

## 2014-12-29 ENCOUNTER — Encounter (INDEPENDENT_AMBULATORY_CARE_PROVIDER_SITE_OTHER): Payer: PPO | Admitting: *Deleted

## 2014-12-29 ENCOUNTER — Ambulatory Visit (INDEPENDENT_AMBULATORY_CARE_PROVIDER_SITE_OTHER): Payer: PPO | Admitting: Cardiovascular Disease

## 2014-12-29 DIAGNOSIS — I4891 Unspecified atrial fibrillation: Secondary | ICD-10-CM | POA: Diagnosis not present

## 2014-12-29 DIAGNOSIS — Z7901 Long term (current) use of anticoagulants: Secondary | ICD-10-CM

## 2014-12-29 LAB — POCT INR: INR: 2.2

## 2015-01-06 ENCOUNTER — Other Ambulatory Visit: Payer: Self-pay | Admitting: Cardiovascular Disease

## 2015-01-18 ENCOUNTER — Encounter: Payer: Self-pay | Admitting: Cardiovascular Disease

## 2015-01-18 ENCOUNTER — Ambulatory Visit (INDEPENDENT_AMBULATORY_CARE_PROVIDER_SITE_OTHER): Payer: PPO | Admitting: Cardiovascular Disease

## 2015-01-18 VITALS — BP 112/60 | HR 55 | Ht 66.0 in | Wt 201.5 lb

## 2015-01-18 DIAGNOSIS — I10 Essential (primary) hypertension: Secondary | ICD-10-CM | POA: Diagnosis not present

## 2015-01-18 DIAGNOSIS — I4891 Unspecified atrial fibrillation: Secondary | ICD-10-CM | POA: Diagnosis not present

## 2015-01-18 DIAGNOSIS — I5032 Chronic diastolic (congestive) heart failure: Secondary | ICD-10-CM | POA: Diagnosis not present

## 2015-01-18 DIAGNOSIS — I2510 Atherosclerotic heart disease of native coronary artery without angina pectoris: Secondary | ICD-10-CM | POA: Diagnosis not present

## 2015-01-18 DIAGNOSIS — E1159 Type 2 diabetes mellitus with other circulatory complications: Secondary | ICD-10-CM

## 2015-01-18 NOTE — Assessment & Plan Note (Signed)
He appears relatively euvolemic on today's visit. Warned him against excessive diuresis. Renal service following

## 2015-01-18 NOTE — Assessment & Plan Note (Signed)
Heart rate well-controlled on his current medication regimen. Tolerating anticoagulation

## 2015-01-18 NOTE — Assessment & Plan Note (Signed)
Blood pressure is well controlled on today's visit. No changes made to the medications. 

## 2015-01-18 NOTE — Progress Notes (Signed)
Patient ID: [name], male    DOB: [birth year], 81 y.o.   MRN: [id number]  HPI Comments: [name] is a 81 y.o. male w/ PMHx s/f CAD s/p CABG 17 years ago, permanent atrial fibrillation, chronic Coumadin anticoagulation, chronic RBBB, HTN and chronic LE edema. He presents for routine followup of his coronary artery disease He has a single kidney.   In follow-up today, he reports that he is doing well. He denies any significant shortness of breath or chest pain with exertion. He's taking torsemide as needed. Started this past weekend for weight gain Weight is stable around 197 pounds up to 201 pounds Blood pressure stable  Prior lab work, Hemoglobin A1c 6.2, total cholesterol in 2014 was 129 No lower extremity edema.  seen by Dr. Abigail Butts, renal service  EKG shows atrial fibrillation with rate 55 bpm, right bundle branch block, left axis deviation  Other past medical history Seen in clinic February 2015 at which time he had  significant leg edema. Amlodipine was held Echocardiogram around that time showed normal LV systolic function, moderate pulmonary hypertension with pleural effusion on the left Also had severe hypertension.     Allergies  Allergen Reactions  . Pollen Extract     Outpatient Encounter Prescriptions as of 01/18/2015  Medication Sig  . Ascorbic Acid (VITAMIN C) 500 MG tablet Take 500 mg by mouth daily.    Marland Kitchen aspirin 81 MG tablet Take 81 mg by mouth daily.    . cloNIDine (CATAPRES) 0.2 MG tablet TAKE 1 TABLET (0.2 MG TOTAL) BY MOUTH 2 (TWO) TIMES DAILY.  . fish oil-omega-3 fatty acids 1000 MG capsule Take 1 g by mouth daily.    Marland Kitchen glucosamine-chondroitin 500-400 MG tablet Take 1 tablet by mouth daily.    Marland Kitchen loratadine (CLARITIN) 10 MG tablet Take 10 mg by mouth daily.  . metoprolol succinate (TOPROL-XL) 100 MG 24 hr tablet TAKE 1 TABLET (100 MG TOTAL) BY MOUTH DAILY.  . Multiple Vitamin (MULTIVITAMIN) tablet Take 1 tablet by mouth daily.    . quinapril  (ACCUPRIL) 40 MG tablet TAKE 1 TABLET (40 MG TOTAL) BY MOUTH 2 (TWO) TIMES DAILY.  . simvastatin (ZOCOR) 10 MG tablet TAKE ONE TABLET BY MOUTH DAILY AT BEDTIME  . spironolactone (ALDACTONE) 25 MG tablet Take 1 tablet (25 mg total) by mouth daily.  . Tamsulosin HCl (FLOMAX) 0.4 MG CAPS Take 0.4 mg by mouth daily.    Marland Kitchen torsemide (DEMADEX) 20 MG tablet TAKE 1 TABLET BY MOUTH 2  TIMES DAILY AS NEEDED.  Marland Kitchen warfarin (COUMADIN) 5 MG tablet TAKE AS DIRECTED BY COUMADIN CLINIC  . [DISCONTINUED] hydrALAZINE (APRESOLINE) 25 MG tablet Take by mouth.  . [DISCONTINUED] Melatonin 10 MG CAPS Take by mouth.  . [DISCONTINUED] simvastatin (ZOCOR) 10 MG tablet TAKE ONE TABLET BY MOUTH DAILY AT BEDTIME (Patient not taking: Reported on 01/18/2015)  . [DISCONTINUED] torsemide (DEMADEX) 20 MG tablet TAKE 1 TABLET BY MOUTH 2  TIMES DAILY AS NEEDED. (Patient not taking: Reported on 01/18/2015)   No facility-administered encounter medications on file as of 01/18/2015.    Past Medical History  Diagnosis Date  . Atrial fibrillation     coumadin therapy  . Coronary artery disease     native vessel  . Hypertension   . RBBB (right bundle branch block)   . Edema   . Squamous cell carcinoma     left hand & right ear     Past Surgical History  Procedure Laterality Date  .  Cardioversion  09/21/08  . Appendectomy    . Squamous cell carcinoma excision      left hand & right ear.   . Prostate ablation      transurethral needle ablation  . Coronary artery bypass graft    . Nephrectomy    . Hernia repair    . Melanoma excision      back    Social History  reports that he has quit smoking. He does not have any smokeless tobacco history on file. He reports that he drinks alcohol. He reports that he does not use illicit drugs.  Family History family history includes Arthritis in his mother; Heart attack in his father. There is no history of Other.    Review of Systems  Constitutional: Negative.   Respiratory:  Negative.   Cardiovascular: Negative.   Gastrointestinal: Negative.   Musculoskeletal: Negative.   Neurological: Negative.   Hematological: Negative.   Psychiatric/Behavioral: Negative.   All other systems reviewed and are negative.   BP 112/60 mmHg  Pulse 55  Ht 5\' 6"  (1.676 m)  Wt 201 lb 8 oz (91.4 kg)  BMI 32.54 kg/m2  Physical Exam  Constitutional: He is oriented to person, place, and time. He appears well-developed and well-nourished.  Obese  HENT:  Head: Normocephalic.  Nose: Nose normal.  Mouth/Throat: Oropharynx is clear and moist.  Eyes: Conjunctivae are normal. Pupils are equal, round, and reactive to light.  Neck: Normal range of motion. Neck supple. No JVD present.  Cardiovascular: S1 normal, S2 normal, normal heart sounds and intact distal pulses.  An irregularly irregular rhythm present. Exam reveals no gallop and no friction rub.   No murmur heard. Pulmonary/Chest: Effort normal and breath sounds normal. No respiratory distress. He has no wheezes. He has no rales. He exhibits no tenderness.  Abdominal: Soft. Bowel sounds are normal. He exhibits no distension. There is no tenderness.  Musculoskeletal: Normal range of motion. He exhibits no edema or tenderness.  Lymphadenopathy:    He has no cervical adenopathy.  Neurological: He is alert and oriented to person, place, and time. Coordination normal.  Skin: Skin is warm and dry. No rash noted. No erythema.  Psychiatric: He has a normal mood and affect. His behavior is normal. Judgment and thought content normal.      Assessment and Plan   Nursing note and vitals reviewed.

## 2015-01-18 NOTE — Patient Instructions (Addendum)
You are doing well. No medication changes were made.  Please call us if you have new issues that need to be addressed before your next appt.  Your physician wants you to follow-up in: 6 months.  You will receive a reminder letter in the mail two months in advance. If you don't receive a letter, please call our office to schedule the follow-up appointment.   

## 2015-01-18 NOTE — Assessment & Plan Note (Signed)
We have encouraged continued exercise, careful diet management in an effort to lose weight. 

## 2015-01-18 NOTE — Assessment & Plan Note (Signed)
Currently with no symptoms of angina. No further workup at this time. Continue current medication regimen. 

## 2015-01-25 ENCOUNTER — Ambulatory Visit (INDEPENDENT_AMBULATORY_CARE_PROVIDER_SITE_OTHER): Payer: PPO | Admitting: Family Medicine

## 2015-01-25 ENCOUNTER — Encounter: Payer: Self-pay | Admitting: Family Medicine

## 2015-01-25 VITALS — BP 110/50 | HR 60 | Temp 97.6°F | Resp 16 | Wt 202.0 lb

## 2015-01-25 DIAGNOSIS — E1159 Type 2 diabetes mellitus with other circulatory complications: Secondary | ICD-10-CM | POA: Diagnosis not present

## 2015-01-25 DIAGNOSIS — I10 Essential (primary) hypertension: Secondary | ICD-10-CM

## 2015-01-25 LAB — POCT GLYCOSYLATED HEMOGLOBIN (HGB A1C): Hemoglobin A1C: 6.3

## 2015-01-25 NOTE — Progress Notes (Signed)
Patient ID: [name], male   DOB: 01/09/1928, 81 y.o.   MRN: [id number]    Subjective:  HPI  Diabetes Mellitus Type II, Follow-up:   Lab Results  Component Value Date   HGBA1C 6.2* 07/14/2014    Last seen for diabetes 6 months ago.  Management changes included none. He reports good compliance with treatment. He is not having side effects.  Current symptoms include none and have been stable. Home blood sugar records: ranging from 110-130  Episodes of hypoglycemia? no   Current Insulin Regimen: none Most Recent Eye Exam: 09/2014 Weight trend: stable Current exercise: none  Pertinent Labs:    Component Value Date/Time   CHOL 119 12/12/2013   TRIG 42 12/12/2013   TRIG 46 02/12/2009 0000   CREATININE 1.5* 12/12/2013   CREATININE 1.29* 09/29/2013 0000    Wt Readings from Last 3 Encounters:  01/25/15 202 lb (91.627 kg)  01/18/15 201 lb 8 oz (91.4 kg)  07/14/14 199 lb (90.266 kg)    ------------------------------------------------------------------------  Hypertension, follow-up:  BP Readings from Last 3 Encounters:  01/25/15 110/50  01/18/15 112/60  07/14/14 110/56    He was last seen for hypertension 6 months ago.  Management changes since that visit include none. He reports good compliance with treatment. He is not having side effects.  He is not exercising. He is adherent to low salt diet.   Outside blood pressures are not being checked. He is experiencing none.  Patient denies none.    Weight trend: stable Wt Readings from Last 3 Encounters:  01/25/15 202 lb (91.627 kg)  01/18/15 201 lb 8 oz (91.4 kg)  07/14/14 199 lb (90.266 kg)   -----------------------------------------------------------------------  CAD/status post CABG Patient asymptomatic for progression of disease. All risk factors are treated. Prior to Admission medications   Medication Sig Start Date End Date Taking? Authorizing Provider  Ascorbic Acid (VITAMIN C) 500 MG tablet  Take 500 mg by mouth daily.     Yes Historical Provider, MD  aspirin 81 MG tablet Take 81 mg by mouth daily.     Yes Historical Provider, MD  cloNIDine (CATAPRES) 0.2 MG tablet TAKE 1 TABLET (0.2 MG TOTAL) BY MOUTH 2 (TWO) TIMES DAILY. 07/07/14  Yes Minna Merritts, MD  fish oil-omega-3 fatty acids 1000 MG capsule Take 1 g by mouth daily.     Yes Historical Provider, MD  glucosamine-chondroitin 500-400 MG tablet Take 1 tablet by mouth daily.     Yes Historical Provider, MD  loratadine (CLARITIN) 10 MG tablet Take 10 mg by mouth daily.   Yes Historical Provider, MD  metoprolol succinate (TOPROL-XL) 100 MG 24 hr tablet TAKE 1 TABLET (100 MG TOTAL) BY MOUTH DAILY. 07/29/14  Yes Minna Merritts, MD  Multiple Vitamin (MULTIVITAMIN) tablet Take 1 tablet by mouth daily.     Yes Historical Provider, MD  quinapril (ACCUPRIL) 40 MG tablet TAKE 1 TABLET (40 MG TOTAL) BY MOUTH 2 (TWO) TIMES DAILY. 07/23/14  Yes Minna Merritts, MD  simvastatin (ZOCOR) 10 MG tablet TAKE ONE TABLET BY MOUTH DAILY AT BEDTIME 09/07/14  Yes Minna Merritts, MD  spironolactone (ALDACTONE) 25 MG tablet Take 1 tablet (25 mg total) by mouth daily. 08/13/14  Yes Minna Merritts, MD  Tamsulosin HCl (FLOMAX) 0.4 MG CAPS Take 0.4 mg by mouth daily.     Yes Historical Provider, MD  torsemide (DEMADEX) 20 MG tablet TAKE 1 TABLET BY MOUTH 2  TIMES DAILY AS NEEDED. 01/07/15  Yes Christia Reading  Gloriajean Dell, MD  warfarin (COUMADIN) 5 MG tablet TAKE AS DIRECTED BY COUMADIN CLINIC 03/19/14  Yes Minna Merritts, MD    Patient Active Problem List   Diagnosis Date Noted  . Actinic keratoses 12/10/2014  . Allergic rhinitis 12/10/2014  . A-fib 12/10/2014  . CCF (congestive cardiac failure) 12/10/2014  . Acid reflux 12/10/2014  . HLD (hyperlipidemia) 12/10/2014  . BP (high blood pressure) 12/10/2014  . Arthritis, degenerative 12/10/2014  . Diabetes mellitus, type 2 12/10/2014  . Chronic renal insufficiency 03/05/2014  . Status post nephrectomy 03/05/2014    . Chronic diastolic CHF (congestive heart failure) 10/13/2013  . Pre-op evaluation 09/17/2013  . Permanent atrial fibrillation 09/17/2013  . Long term current use of anticoagulant 10/25/2010  . DYSPNEA ON EXERTION 11/01/2009  . CAD, NATIVE VESSEL 10/22/2009  . HYPERTENSION, BENIGN 03/09/2009  . RBBB 12/29/2008  . EDEMA 12/29/2008    Past Medical History  Diagnosis Date  . Atrial fibrillation     coumadin therapy  . Coronary artery disease     native vessel  . Hypertension   . RBBB (right bundle branch block)   . Edema   . Squamous cell carcinoma     left hand & right ear     History   Social History  . Marital Status: Married    Spouse Name: N/A  . Number of Children: 3  . 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 Smoking status: Former Research scientist (life sciences)  . Smokeless tobacco: Not on file  . Alcohol Use: Yes     Comment: He enjoys a couple of alcoholic beverages per day  . Drug Use: No  . Sexual Activity: Not on file   Other Topics Concern  . Not on file   Social History Narrative   He lives with his wife.          Allergies  Allergen Reactions  . Pollen Extract     Review of Systems  Constitutional: Negative.   Eyes: Negative.   Respiratory: Negative.   Cardiovascular: Negative.   Gastrointestinal: Negative.   Genitourinary: Negative.   Musculoskeletal: Negative.   Skin: Negative.   Neurological: Negative.   Endo/Heme/Allergies: Negative.   Psychiatric/Behavioral: Negative.   All other systems reviewed and are negative.   Immunization History  Administered Date(s) Administered  . Pneumococcal Conjugate-13 05/12/2014  . Pneumococcal Polysaccharide-23 06/07/1996  . Tdap 04/03/2011   Objective:  BP 110/50 mmHg  Pulse 60  Temp(Src) 97.6 F (36.4 C) (Oral)  Resp 16  Wt 202 lb (91.627 kg)  Physical Exam  Constitutional: He is oriented to person, place, and time and  well-developed, well-nourished, and in no distress.  HENT:  Head: Normocephalic and atraumatic.  Right Ear: External ear normal.  Left Ear: External ear normal.  Nose: Nose normal.  Eyes: Conjunctivae and EOM are normal. Pupils are equal, round, and reactive to light.  Neck: Normal range of motion. Neck supple.  Cardiovascular: Normal rate, regular rhythm, normal heart sounds and intact distal pulses.   Pulmonary/Chest: Effort normal and breath sounds normal.  Abdominal: Soft. Bowel sounds are normal.  Musculoskeletal: Normal range of motion.  Neurological: He is alert and oriented to person, place, and time. He has normal reflexes. Gait normal. GCS score is 15.  Skin: Skin is warm and dry.  Psychiatric: Mood, memory, affect and judgment normal.    Lab Results  Component Value Date  WBC 5.6 12/12/2013   HGB 14.9 12/12/2013   HCT 43 12/12/2013   PLT 151 12/12/2013   GLUCOSE 97 09/29/2013   CHOL 119 12/12/2013   TRIG 42 12/12/2013   HDL 63 12/12/2013   LDLCALC 48 12/12/2013   TSH 2.53 12/12/2013   INR 2.2 12/29/2014   HGBA1C 6.2* 07/14/2014    CMP     Component Value Date/Time   NA 138 12/12/2013   NA 141 02/12/2009 0000   K 3.9 12/12/2013   CL 103 09/29/2013 0000   CO2 22 09/29/2013 0000   GLUCOSE 97 09/29/2013 0000   GLUCOSE 118* 09/21/2008 1139   BUN 28* 12/12/2013   BUN 16 02/12/2009 0000   CREATININE 1.5* 12/12/2013   CREATININE 1.29* 09/29/2013 0000   CALCIUM 8.9 09/29/2013 0000   ALBUMIN 4.2 02/12/2009 0000   AST 25 12/12/2013   ALT 23 12/12/2013   ALKPHOS 63 12/12/2013   GFRNONAA 51* 09/29/2013 0000   GFRAA 59* 09/29/2013 0000    Assessment and Plan :   Type 2 diabetes with vascular complications. Excellent control with A1c of 6.3. By aspiration and anywhere between 6.3 and 8 is acceptable at age 65.  CAD/status post CABG Stable in all risk factors treated.  Hypertension  A short fibrillation  History of CHF/diastolic  CHF  Hyperlipidemia  Hypertensive nephropathy  status post nephrectomy for renal cell [facility] 01/25/2015 11:13 AM

## 2015-02-03 ENCOUNTER — Ambulatory Visit (INDEPENDENT_AMBULATORY_CARE_PROVIDER_SITE_OTHER): Payer: PPO

## 2015-02-03 ENCOUNTER — Other Ambulatory Visit: Payer: Self-pay | Admitting: Cardiovascular Disease

## 2015-02-03 DIAGNOSIS — I4891 Unspecified atrial fibrillation: Secondary | ICD-10-CM | POA: Diagnosis not present

## 2015-02-03 DIAGNOSIS — Z7901 Long term (current) use of anticoagulants: Secondary | ICD-10-CM

## 2015-02-03 LAB — POCT INR: INR: 2.2

## 2015-03-01 ENCOUNTER — Other Ambulatory Visit: Payer: Self-pay | Admitting: Family Medicine

## 2015-03-06 ENCOUNTER — Other Ambulatory Visit: Payer: Self-pay | Admitting: Cardiovascular Disease

## 2015-03-17 ENCOUNTER — Ambulatory Visit (INDEPENDENT_AMBULATORY_CARE_PROVIDER_SITE_OTHER): Payer: PPO | Admitting: *Deleted

## 2015-03-17 DIAGNOSIS — I4891 Unspecified atrial fibrillation: Secondary | ICD-10-CM | POA: Diagnosis not present

## 2015-03-17 DIAGNOSIS — Z7901 Long term (current) use of anticoagulants: Secondary | ICD-10-CM | POA: Diagnosis not present

## 2015-03-17 LAB — POCT INR: INR: 2.2

## 2015-03-21 ENCOUNTER — Other Ambulatory Visit: Payer: Self-pay | Admitting: Cardiovascular Disease

## 2015-04-03 ENCOUNTER — Other Ambulatory Visit: Payer: Self-pay | Admitting: Cardiovascular Disease

## 2015-04-28 ENCOUNTER — Ambulatory Visit (INDEPENDENT_AMBULATORY_CARE_PROVIDER_SITE_OTHER): Payer: PPO

## 2015-04-28 DIAGNOSIS — Z7901 Long term (current) use of anticoagulants: Secondary | ICD-10-CM | POA: Diagnosis not present

## 2015-04-28 DIAGNOSIS — I4891 Unspecified atrial fibrillation: Secondary | ICD-10-CM | POA: Diagnosis not present

## 2015-04-28 LAB — POCT INR: INR: 3.1

## 2015-05-27 ENCOUNTER — Ambulatory Visit (INDEPENDENT_AMBULATORY_CARE_PROVIDER_SITE_OTHER): Payer: PPO

## 2015-05-27 DIAGNOSIS — Z23 Encounter for immunization: Secondary | ICD-10-CM

## 2015-06-09 ENCOUNTER — Ambulatory Visit (INDEPENDENT_AMBULATORY_CARE_PROVIDER_SITE_OTHER): Payer: PPO

## 2015-06-09 DIAGNOSIS — Z7901 Long term (current) use of anticoagulants: Secondary | ICD-10-CM | POA: Diagnosis not present

## 2015-06-09 DIAGNOSIS — I4891 Unspecified atrial fibrillation: Secondary | ICD-10-CM

## 2015-06-09 LAB — POCT INR: INR: 2.1

## 2015-06-16 ENCOUNTER — Other Ambulatory Visit: Payer: Self-pay | Admitting: Cardiovascular Disease

## 2015-06-30 ENCOUNTER — Other Ambulatory Visit: Payer: Self-pay | Admitting: Cardiovascular Disease

## 2015-07-16 ENCOUNTER — Other Ambulatory Visit: Payer: Self-pay | Admitting: Cardiovascular Disease

## 2015-07-19 ENCOUNTER — Ambulatory Visit (INDEPENDENT_AMBULATORY_CARE_PROVIDER_SITE_OTHER): Payer: PPO | Admitting: Cardiovascular Disease

## 2015-07-19 ENCOUNTER — Encounter: Payer: Self-pay | Admitting: Cardiovascular Disease

## 2015-07-19 ENCOUNTER — Ambulatory Visit (INDEPENDENT_AMBULATORY_CARE_PROVIDER_SITE_OTHER): Payer: PPO

## 2015-07-19 VITALS — BP 120/60 | HR 64 | Ht 66.0 in | Wt 202.5 lb

## 2015-07-19 DIAGNOSIS — I4821 Permanent atrial fibrillation: Secondary | ICD-10-CM

## 2015-07-19 DIAGNOSIS — I5032 Chronic diastolic (congestive) heart failure: Secondary | ICD-10-CM | POA: Diagnosis not present

## 2015-07-19 DIAGNOSIS — R0602 Shortness of breath: Secondary | ICD-10-CM

## 2015-07-19 DIAGNOSIS — Z7901 Long term (current) use of anticoagulants: Secondary | ICD-10-CM

## 2015-07-19 DIAGNOSIS — I4891 Unspecified atrial fibrillation: Secondary | ICD-10-CM | POA: Diagnosis not present

## 2015-07-19 DIAGNOSIS — I482 Chronic atrial fibrillation: Secondary | ICD-10-CM | POA: Diagnosis not present

## 2015-07-19 DIAGNOSIS — E1159 Type 2 diabetes mellitus with other circulatory complications: Secondary | ICD-10-CM

## 2015-07-19 DIAGNOSIS — I2510 Atherosclerotic heart disease of native coronary artery without angina pectoris: Secondary | ICD-10-CM

## 2015-07-19 DIAGNOSIS — N183 Chronic kidney disease, stage 3 unspecified: Secondary | ICD-10-CM

## 2015-07-19 DIAGNOSIS — I10 Essential (primary) hypertension: Secondary | ICD-10-CM

## 2015-07-19 LAB — POCT INR: INR: 2.2

## 2015-07-19 NOTE — Assessment & Plan Note (Signed)
Recommended he try not to use torsemide twice a day unless needed for shortness of breath or worsening leg edema Recommended he start wearing his compression hose again

## 2015-07-19 NOTE — Assessment & Plan Note (Signed)
Heart rate well controlled, tolerating anticoagulation Stable, no changes to his medications

## 2015-07-19 NOTE — Assessment & Plan Note (Signed)
Currently with no symptoms of angina. No further workup at this time. Continue current medication regimen. 

## 2015-07-19 NOTE — Assessment & Plan Note (Signed)
He admits to long-standing shortness of breath on exertion, as well as positional Likely from obesity, deconditioning No change from prior symptoms

## 2015-07-19 NOTE — Patient Instructions (Signed)
You are doing well. No medication changes were made.  Take torsemde once a day,  Twice a day for worsening shortness of breath, swelling  Please call us if you have new issues that need to be addressed before your next appt.  Your physician wants you to follow-up in: 6 months.  You will receive a reminder letter in the mail two months in advance. If you don't receive a letter, please call our office to schedule the follow-up appointment.

## 2015-07-19 NOTE — Assessment & Plan Note (Signed)
Blood pressure is well controlled on today's visit. No changes made to the medications. 

## 2015-07-19 NOTE — Progress Notes (Signed)
Patient ID: [name], male    DOB: 26-May-1928, 81 y.o.   MRN: [id number]  HPI Comments: [name] is a 81 y.o. male w/ PMHx s/f CAD s/p CABG 17 years ago, permanent atrial fibrillation, chronic Coumadin anticoagulation, chronic RBBB, HTN and chronic LE edema. He presents for routine followup of his coronary artery disease He has a single kidney.   In follow-up today, he reports that he is been taking torsemide 20 mg twice a day. Reports it has helped his breathing and leg swelling. Weight at the high end of this range He is been eating more, no exercise Difficulty with breathing when he eats too much, bends over Denies any anginal type symptoms  Reports having aFall Saturday on backside, bruise, fell over a chest Has not been wearing his compression hose recently, more swelling on the right than the left  EKG on today's visit shows atrial fibrillation with ventricular rate 64 bpm, right bundle branch block, nonspecific ST and T wave abnormality  Other past medical history Prior lab work, Hemoglobin A1c 6.2, total cholesterol in 2014 was 129  seen by Dr. Abigail Butts, renal service  Seen in clinic February 2015 at which time he had  significant leg edema. Amlodipine was held Echocardiogram around that time showed normal LV systolic function, moderate pulmonary hypertension with pleural effusion on the left Also had severe hypertension.     Allergies  Allergen Reactions  . Pollen Extract     Outpatient Encounter Prescriptions as of 07/19/2015  Medication Sig  . Ascorbic Acid (VITAMIN C) 500 MG tablet Take 500 mg by mouth daily.    Marland Kitchen aspirin 81 MG tablet Take 81 mg by mouth daily.    . cloNIDine (CATAPRES) 0.2 MG tablet TAKE 1 TABLET (0.2 MG TOTAL) BY MOUTH 2 (TWO) TIMES DAILY.  . fish oil-omega-3 fatty acids 1000 MG capsule Take 1 g by mouth daily.    Marland Kitchen glucosamine-chondroitin 500-400 MG tablet Take 1 tablet by mouth daily.    Marland Kitchen loratadine (CLARITIN) 10 MG tablet TAKE 1  TABLET BY MOUTH DAILY AS NEEDED  . metoprolol succinate (TOPROL-XL) 100 MG 24 hr tablet TAKE 1 TABLET (100 MG TOTAL) BY MOUTH DAILY.  . metoprolol succinate (TOPROL-XL) 100 MG 24 hr tablet TAKE 1 TABLET (100 MG TOTAL) BY MOUTH DAILY.  . Multiple Vitamin (MULTIVITAMIN) tablet Take 1 tablet by mouth daily.    . quinapril (ACCUPRIL) 40 MG tablet TAKE 1 TABLET (40 MG TOTAL) BY MOUTH 2 (TWO) TIMES DAILY.  . simvastatin (ZOCOR) 10 MG tablet TAKE ONE TABLET BY MOUTH DAILY AT BEDTIME  . Tamsulosin HCl (FLOMAX) 0.4 MG CAPS Take 0.4 mg by mouth daily.    Marland Kitchen torsemide (DEMADEX) 20 MG tablet TAKE 1 TABLET BY MOUTH 2  TIMES DAILY AS NEEDED.  Marland Kitchen warfarin (COUMADIN) 5 MG tablet TAKE AS DIRECTED BY COUMADIN CLINIC   No facility-administered encounter medications on file as of 07/19/2015.    Past Medical History  Diagnosis Date  . Atrial fibrillation (HCC)     coumadin therapy  . Coronary artery disease     native vessel  . Hypertension   . RBBB (right bundle branch block)   . Edema   . Squamous cell carcinoma (HCC)     left hand & right ear     Past Surgical History  Procedure Laterality Date  . Cardioversion  09/21/08  . Appendectomy    . Squamous cell carcinoma excision      left hand &  right ear.   . Prostate ablation      transurethral needle ablation  . Coronary artery bypass graft    . Nephrectomy    . Hernia repair    . Melanoma excision      back    Social History  reports that he has quit smoking. He does not have any smokeless tobacco history on file. He reports that he drinks alcohol. He reports that he does not use illicit drugs.  Family History family history includes Arthritis in his mother; Heart attack in his father. There is no history of Other.    Review of Systems  Constitutional: Negative.   Respiratory: Negative.   Cardiovascular: Negative.   Gastrointestinal: Negative.   Musculoskeletal: Negative.   Neurological: Negative.   Hematological: Negative.    Psychiatric/Behavioral: Negative.   All other systems reviewed and are negative.   BP 120/60 mmHg  Pulse 64  Ht 5\' 6"  (1.676 m)  Wt 202 lb 8 oz (91.853 kg)  BMI 32.70 kg/m2  Physical Exam  Constitutional: He is oriented to person, place, and time. He appears well-developed and well-nourished.  Obese  HENT:  Head: Normocephalic.  Nose: Nose normal.  Mouth/Throat: Oropharynx is clear and moist.  Eyes: Conjunctivae are normal. Pupils are equal, round, and reactive to light.  Neck: Normal range of motion. Neck supple. No JVD present.  Cardiovascular: S1 normal, S2 normal, normal heart sounds and intact distal pulses.  An irregularly irregular rhythm present. Exam reveals no gallop and no friction rub.   No murmur heard. Trace pitting edema on the right greater than the left, signs of chronic lower extremity stasis  Pulmonary/Chest: Effort normal and breath sounds normal. No respiratory distress. He has no wheezes. He has no rales. He exhibits no tenderness.  Abdominal: Soft. Bowel sounds are normal. He exhibits no distension. There is no tenderness.  Musculoskeletal: Normal range of motion. He exhibits no edema or tenderness.  Lymphadenopathy:    He has no cervical adenopathy.  Neurological: He is alert and oriented to person, place, and time. Coordination normal.  Skin: Skin is warm and dry. No rash noted. No erythema.  Psychiatric: He has a normal mood and affect. His behavior is normal. Judgment and thought content normal.      Assessment and Plan   Nursing note and vitals reviewed.

## 2015-07-19 NOTE — Assessment & Plan Note (Signed)
Single kidney I'm concerned of overdiuresis. Recommended he decrease his torsemide dosing if possible

## 2015-07-19 NOTE — Assessment & Plan Note (Signed)
We have encouraged continued exercise, careful diet management in an effort to lose weight.  he admits to overeating

## 2015-07-27 ENCOUNTER — Encounter: Payer: Self-pay | Admitting: Family Medicine

## 2015-07-27 ENCOUNTER — Ambulatory Visit (INDEPENDENT_AMBULATORY_CARE_PROVIDER_SITE_OTHER): Payer: PPO | Admitting: Family Medicine

## 2015-07-27 VITALS — BP 122/60 | HR 92 | Temp 97.8°F | Resp 18 | Ht 66.0 in | Wt 208.0 lb

## 2015-07-27 DIAGNOSIS — W19XXXA Unspecified fall, initial encounter: Secondary | ICD-10-CM

## 2015-07-27 DIAGNOSIS — Z0000 Encounter for general adult medical examination without abnormal findings: Secondary | ICD-10-CM

## 2015-07-27 DIAGNOSIS — R7309 Other abnormal glucose: Secondary | ICD-10-CM | POA: Diagnosis not present

## 2015-07-27 DIAGNOSIS — E785 Hyperlipidemia, unspecified: Secondary | ICD-10-CM

## 2015-07-27 DIAGNOSIS — I10 Essential (primary) hypertension: Secondary | ICD-10-CM | POA: Diagnosis not present

## 2015-07-27 LAB — POCT GLYCOSYLATED HEMOGLOBIN (HGB A1C): Hemoglobin A1C: 6.2

## 2015-07-27 NOTE — Progress Notes (Signed)
Patient ID: [name], male   DOB: April 23, 1928, 81 y.o.   MRN: [id number] Patient: [name], Male    DOB: Dec 31, 1927, 81 y.o.   MRN: [id number] Visit Date: 07/27/2015  Today's Provider: Wilhemena Durie, MD   Chief Complaint  Patient presents with  . Annual Exam   Subjective:   [name] is a 81 y.o. male who presents today for his Subsequent Annual Wellness Visit. He feels well. He reports exercising walking daily. He reports he is sleeping well. Patient is a widower, he has 3 children and 2 grandchildren. 1. Medicare annual wellness visit, subsequent   2. Fall, initial encounter Patient has had 1 fall in the past year.  He tripped over a storage chest that is at the foot of his bed.  He fell to the floor and landed on his buttocks and during the way down he hit his left leg on his dresser.  He sustained large bruise and has been sore enough that it has made waling difficult.  Review of Systems  Constitutional: Negative.   HENT: Positive for congestion (chronic and unchanged).   Eyes: Negative.   Respiratory: Positive for shortness of breath (chronic and unchanged).   Cardiovascular: Negative.  Leg swelling: chronic and improving.  Gastrointestinal: Negative.   Endocrine: Negative.   Genitourinary: Negative.   Musculoskeletal: Negative.   Skin: Negative.   Allergic/Immunologic: Negative.   Neurological: Negative.   Hematological: Bruises/bleeds easily (secondary to Warfarin).  Psychiatric/Behavioral: Negative.     Patient Active Problem List   Diagnosis Date Noted  . Actinic keratoses 12/10/2014  . Allergic rhinitis 12/10/2014  . A-fib ([place]) 12/10/2014  . CCF (congestive cardiac failure) ([place]) 12/10/2014  . Acid reflux 12/10/2014  . HLD (hyperlipidemia) 12/10/2014  . BP (high blood pressure) 12/10/2014  . Arthritis, degenerative 12/10/2014  . Diabetes mellitus, type 2 ([place]) 12/10/2014  . Chronic renal insufficiency 03/05/2014  . Status post nephrectomy  03/05/2014  . Chronic diastolic CHF (congestive heart failure) ([place]) 10/13/2013  . Pre-op evaluation 09/17/2013  . Permanent atrial fibrillation ([place]) 09/17/2013  . Long term current use of anticoagulant 10/25/2010  . Shortness of breath 11/01/2009  . CAD, NATIVE VESSEL 10/22/2009  . HYPERTENSION, BENIGN 03/09/2009  . RBBB 12/29/2008  . EDEMA 12/29/2008    Social History   Social History  . Marital Status: Married    Spouse Name: N/A  . Number of Children: 3  . 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 Smoking status: Former Research scientist (life sciences)  . Smokeless tobacco: Not on file  . Alcohol Use: 2.4 - 4.8 oz/week    4-8 Standard drinks or equivalent per week  . Drug Use: No  . Sexual Activity: Not on file   Other Topics Concern  . Not on file   Social History Narrative   He lives with his wife.          Past Surgical History  Procedure Laterality Date  . Cardioversion  09/21/08  . Appendectomy    . Squamous cell carcinoma excision      left hand & right ear.   . Prostate ablation      transurethral needle ablation  . Coronary artery bypass graft    . Nephrectomy    . Hernia repair    . Melanoma excision      back    His family history includes Arthritis in his mother;  Heart attack in his father. There is no history of Other.    Outpatient Prescriptions Prior to Visit  Medication Sig Dispense Refill  . Ascorbic Acid (VITAMIN C) 500 MG tablet Take 500 mg by mouth daily.      Marland Kitchen aspirin 81 MG tablet Take 81 mg by mouth daily.      . cloNIDine (CATAPRES) 0.2 MG tablet TAKE 1 TABLET (0.2 MG TOTAL) BY MOUTH 2 (TWO) TIMES DAILY. 180 tablet 1  . fish oil-omega-3 fatty acids 1000 MG capsule Take 1 g by mouth daily.      Marland Kitchen glucosamine-chondroitin 500-400 MG tablet Take 1 tablet by mouth daily.      Marland Kitchen loratadine (CLARITIN) 10 MG tablet TAKE 1 TABLET BY MOUTH DAILY AS NEEDED 30 tablet 11  . metoprolol succinate  (TOPROL-XL) 100 MG 24 hr tablet TAKE 1 TABLET (100 MG TOTAL) BY MOUTH DAILY. 30 tablet 3  . Multiple Vitamin (MULTIVITAMIN) tablet Take 1 tablet by mouth daily.      . quinapril (ACCUPRIL) 40 MG tablet TAKE 1 TABLET (40 MG TOTAL) BY MOUTH 2 (TWO) TIMES DAILY. 180 tablet 3  . simvastatin (ZOCOR) 10 MG tablet TAKE ONE TABLET BY MOUTH DAILY AT BEDTIME 90 tablet 4  . Tamsulosin HCl (FLOMAX) 0.4 MG CAPS Take 0.4 mg by mouth daily.      Marland Kitchen torsemide (DEMADEX) 20 MG tablet TAKE 1 TABLET BY MOUTH 2  TIMES DAILY AS NEEDED. 60 tablet 3  . warfarin (COUMADIN) 5 MG tablet TAKE AS DIRECTED BY COUMADIN CLINIC 90 tablet 0  . metoprolol succinate (TOPROL-XL) 100 MG 24 hr tablet TAKE 1 TABLET (100 MG TOTAL) BY MOUTH DAILY. 30 tablet 3   No facility-administered medications prior to visit.    Allergies  Allergen Reactions  . Pollen Extract    Outpatient Encounter Prescriptions as of 07/27/2015  Medication Sig  . Ascorbic Acid (VITAMIN C) 500 MG tablet Take 500 mg by mouth daily.    Marland Kitchen aspirin 81 MG tablet Take 81 mg by mouth daily.    . cloNIDine (CATAPRES) 0.2 MG tablet TAKE 1 TABLET (0.2 MG TOTAL) BY MOUTH 2 (TWO) TIMES DAILY.  . fish oil-omega-3 fatty acids 1000 MG capsule Take 1 g by mouth daily.    Marland Kitchen glucosamine-chondroitin 500-400 MG tablet Take 1 tablet by mouth daily.    Marland Kitchen loratadine (CLARITIN) 10 MG tablet TAKE 1 TABLET BY MOUTH DAILY AS NEEDED  . metoprolol succinate (TOPROL-XL) 100 MG 24 hr tablet TAKE 1 TABLET (100 MG TOTAL) BY MOUTH DAILY.  . Multiple Vitamin (MULTIVITAMIN) tablet Take 1 tablet by mouth daily.    . quinapril (ACCUPRIL) 40 MG tablet TAKE 1 TABLET (40 MG TOTAL) BY MOUTH 2 (TWO) TIMES DAILY.  . simvastatin (ZOCOR) 10 MG tablet TAKE ONE TABLET BY MOUTH DAILY AT BEDTIME  . Tamsulosin HCl (FLOMAX) 0.4 MG CAPS Take 0.4 mg by mouth daily.    Marland Kitchen torsemide (DEMADEX) 20 MG tablet TAKE 1 TABLET BY MOUTH 2  TIMES DAILY AS NEEDED.  Marland Kitchen warfarin (COUMADIN) 5 MG tablet TAKE AS DIRECTED BY  COUMADIN CLINIC  . [DISCONTINUED] metoprolol succinate (TOPROL-XL) 100 MG 24 hr tablet TAKE 1 TABLET (100 MG TOTAL) BY MOUTH DAILY.   No facility-administered encounter medications on file as of 07/27/2015.    Patient Care Team: Jerrol Banana., MD as PCP - General (Unknown Physician Specialty)  Objective:   Vitals:  Filed Vitals:   07/27/15 1033  BP: 122/60  Pulse: 92  Temp: 97.8 F (36.6 C)  TempSrc: Oral  Resp: 18  Height: 5\' 6"  (1.676 m)  Weight: 208 lb (94.348 kg)    Physical Exam  Constitutional: He is oriented to person, place, and time. He appears well-developed and well-nourished.  HENT:  Head: Normocephalic and atraumatic.  Right Ear: External ear normal.  Left Ear: External ear normal.  Nose: Nose normal.  Mouth/Throat: Oropharynx is clear and moist.  Eyes: Conjunctivae and EOM are normal. Pupils are equal, round, and reactive to light.  Neck: Normal range of motion. Neck supple. No JVD present. No thyromegaly present.  Cardiovascular: Normal rate, regular rhythm, normal heart sounds and intact distal pulses.   Pulmonary/Chest: Effort normal and breath sounds normal.  Abdominal: Soft. Bowel sounds are normal.  Genitourinary: Penis normal.  Musculoskeletal: Normal range of motion. He exhibits edema.  1+ lower extremity edema  Lymphadenopathy:    He has no cervical adenopathy.  Neurological: He is alert and oriented to person, place, and time.  Skin: Skin is warm and dry.  Psychiatric: He has a normal mood and affect. His behavior is normal. Judgment and thought content normal.    Activities of Daily Living In your present state of health, do you have any difficulty performing the following activities: 07/27/2015  Hearing? N  Vision? N  Difficulty concentrating or making decisions? N  Walking or climbing stairs? N  Dressing or bathing? N  Doing errands, shopping? N    Fall Risk Assessment Fall Risk  07/27/2015  Falls in the past year? Yes   Number falls in past yr: 1  Injury with Fall? Yes     Depression Screen PHQ 2/9 Scores 07/27/2015  PHQ - 2 Score 0    Cognitive Testing - 6-CIT    Year: 0 4 points  Month: 0 3 points  Memorize "Pia Mau, [address], [place]"  Time (within 1 hour:) 0 3 points  Count backwards from 20: 0 2 4 points  Name months of year: 0 2 4 points  Repeat Address: 0 2 4 6 8 10  points   Total Score: 1/28  Interpretation : Normal (0-7) Abnormal (8-28)    Assessment & Plan:     Annual Wellness Visit  Reviewed patient's Family Medical History Reviewed and updated list of patient's medical providers Assessment of cognitive impairment was done Assessed patient's functional ability Established a written schedule for health screening [place] Completed and Reviewed  Exercise Activities and Dietary recommendations Goals    None      Immunization History  Administered Date(s) Administered  . Influenza, High Dose Seasonal PF 05/27/2015  . Pneumococcal Conjugate-13 05/12/2014  . Pneumococcal Polysaccharide-23 06/07/1996  . Tdap 04/03/2011    Health Maintenance  Topic Date Due  . FOOT EXAM  08/05/1940  . OPHTHALMOLOGY EXAM  08/05/1940  . ZOSTAVAX  08/05/1990  . HEMOGLOBIN A1C  07/27/2015  . INFLUENZA VACCINE  03/07/2016  . TETANUS/TDAP  04/02/2021  . PNA vac Low Risk Adult  Completed   1. Medicare annual wellness visit, subsequent   2. Fall, initial encounter Patient fell 10 days ago. He is slowly getting weaker and is more unsteady on his feet. No focal abnormalities on his exam.  3. Elevated glucose  - POCT glycosylated hemoglobin (Hb A1C)  4. Hyperlipidemia  - Lipid Panel With LDL/HDL Ratio  5. Essential hypertension  - CBC With Differential/Platelet - COMPLETE METABOLIC PANEL WITH GFR - TSH    Discussed health benefits of physical activity,  and encouraged him to engage in regular exercise appropriate for his age and condition.     Miguel Aschoff MD [facility] 07/27/2015 10:36 AM  ------------------------------------------------------------------------------------------------------------

## 2015-08-02 ENCOUNTER — Other Ambulatory Visit: Payer: Self-pay | Admitting: Cardiovascular Disease

## 2015-08-06 LAB — LIPID PANEL WITH LDL/HDL RATIO
Cholesterol, Total: 116 mg/dL (ref 100–199)
HDL: 52 mg/dL (ref >39)
LDL CALC: 51 mg/dL (ref 0–99)
LDL/HDL RATIO: 1 ratio (ref 0.0–3.6)
Triglycerides: 66 mg/dL (ref 0–149)
VLDL Cholesterol Cal: 13 mg/dL (ref 5–40)

## 2015-08-06 LAB — TSH: TSH: 2.29 u[IU]/mL (ref 0.450–4.500)

## 2015-08-12 ENCOUNTER — Telehealth: Payer: Self-pay | Admitting: Family Medicine

## 2015-08-12 NOTE — Telephone Encounter (Signed)
4 months

## 2015-08-12 NOTE — Telephone Encounter (Signed)
Advised patient as below. Patient will call back to schedule appt.

## 2015-08-12 NOTE — Telephone Encounter (Signed)
Patient had CPE in 07/2015 and labs were stable. Patient's HGBA1c was 6.2 and lipids were stable. Patient did have a fall 10 days prior to appt, but exam was normal. When would you like for patient to come back? Please advise. Thanks!

## 2015-08-12 NOTE — Telephone Encounter (Signed)
Pt would like to know when Dr. Rosanna Randy would like pt to schedule his follow up appt. Thanks TNP

## 2015-09-01 ENCOUNTER — Ambulatory Visit (INDEPENDENT_AMBULATORY_CARE_PROVIDER_SITE_OTHER): Payer: PPO

## 2015-09-01 DIAGNOSIS — Z7901 Long term (current) use of anticoagulants: Secondary | ICD-10-CM | POA: Diagnosis not present

## 2015-09-01 DIAGNOSIS — D0439 Carcinoma in situ of skin of other parts of face: Secondary | ICD-10-CM | POA: Diagnosis not present

## 2015-09-01 DIAGNOSIS — I4891 Unspecified atrial fibrillation: Secondary | ICD-10-CM

## 2015-09-01 LAB — POCT INR: INR: 2.1

## 2015-09-06 ENCOUNTER — Other Ambulatory Visit: Payer: Self-pay | Admitting: Cardiovascular Disease

## 2015-09-06 NOTE — Telephone Encounter (Signed)
Please review for refill. Thanks!  

## 2015-09-18 ENCOUNTER — Other Ambulatory Visit: Payer: Self-pay | Admitting: Cardiovascular Disease

## 2015-09-20 DIAGNOSIS — E119 Type 2 diabetes mellitus without complications: Secondary | ICD-10-CM | POA: Diagnosis not present

## 2015-09-20 DIAGNOSIS — Z9842 Cataract extraction status, left eye: Secondary | ICD-10-CM | POA: Diagnosis not present

## 2015-09-20 DIAGNOSIS — H2511 Age-related nuclear cataract, right eye: Secondary | ICD-10-CM | POA: Diagnosis not present

## 2015-09-22 LAB — HM DIABETES EYE EXAM

## 2015-10-01 DIAGNOSIS — I509 Heart failure, unspecified: Secondary | ICD-10-CM | POA: Diagnosis not present

## 2015-10-01 DIAGNOSIS — E875 Hyperkalemia: Secondary | ICD-10-CM | POA: Diagnosis not present

## 2015-10-01 DIAGNOSIS — N183 Chronic kidney disease, stage 3 (moderate): Secondary | ICD-10-CM | POA: Diagnosis not present

## 2015-10-01 DIAGNOSIS — D631 Anemia in chronic kidney disease: Secondary | ICD-10-CM | POA: Diagnosis not present

## 2015-10-01 DIAGNOSIS — I10 Essential (primary) hypertension: Secondary | ICD-10-CM | POA: Diagnosis not present

## 2015-10-01 DIAGNOSIS — N2581 Secondary hyperparathyroidism of renal origin: Secondary | ICD-10-CM | POA: Diagnosis not present

## 2015-10-06 DIAGNOSIS — E875 Hyperkalemia: Secondary | ICD-10-CM | POA: Diagnosis not present

## 2015-10-06 DIAGNOSIS — N2581 Secondary hyperparathyroidism of renal origin: Secondary | ICD-10-CM | POA: Diagnosis not present

## 2015-10-06 DIAGNOSIS — I129 Hypertensive chronic kidney disease with stage 1 through stage 4 chronic kidney disease, or unspecified chronic kidney disease: Secondary | ICD-10-CM | POA: Diagnosis not present

## 2015-10-06 DIAGNOSIS — N183 Chronic kidney disease, stage 3 (moderate): Secondary | ICD-10-CM | POA: Diagnosis not present

## 2015-10-08 ENCOUNTER — Encounter: Payer: Self-pay | Admitting: Family Medicine

## 2015-10-13 ENCOUNTER — Ambulatory Visit (INDEPENDENT_AMBULATORY_CARE_PROVIDER_SITE_OTHER): Payer: PPO

## 2015-10-13 DIAGNOSIS — Z7901 Long term (current) use of anticoagulants: Secondary | ICD-10-CM

## 2015-10-13 DIAGNOSIS — I4891 Unspecified atrial fibrillation: Secondary | ICD-10-CM

## 2015-10-13 LAB — POCT INR: INR: 2.6

## 2015-11-08 ENCOUNTER — Encounter: Payer: Self-pay | Admitting: Family Medicine

## 2015-11-08 ENCOUNTER — Ambulatory Visit (INDEPENDENT_AMBULATORY_CARE_PROVIDER_SITE_OTHER): Payer: PPO | Admitting: Family Medicine

## 2015-11-08 VITALS — BP 128/64 | HR 64 | Temp 97.7°F | Resp 18 | Wt 202.0 lb

## 2015-11-08 DIAGNOSIS — E1159 Type 2 diabetes mellitus with other circulatory complications: Secondary | ICD-10-CM | POA: Diagnosis not present

## 2015-11-08 DIAGNOSIS — E785 Hyperlipidemia, unspecified: Secondary | ICD-10-CM

## 2015-11-08 DIAGNOSIS — I10 Essential (primary) hypertension: Secondary | ICD-10-CM

## 2015-11-08 DIAGNOSIS — G4700 Insomnia, unspecified: Secondary | ICD-10-CM | POA: Diagnosis not present

## 2015-11-08 LAB — POCT GLYCOSYLATED HEMOGLOBIN (HGB A1C): Hemoglobin A1C: 6.5

## 2015-11-08 NOTE — Progress Notes (Signed)
Patient ID: [name], male   DOB: 1927/09/09, 82 y.o.   MRN: [id number]    Subjective:  HPI  Diabetes Mellitus Type II, Follow-up:   Lab Results  Component Value Date   HGBA1C 6.2 07/27/2015   HGBA1C 6.3 01/25/2015   HGBA1C 6.2* 07/14/2014   Last seen for diabetes 4 months ago.  Management since then includes none. He reports good compliance with treatment. He is not having side effects.  Current symptoms include none and have been unchanged. Home blood sugar records: 130's-140's fasting  Episodes of hypoglycemia? no   Current Insulin Regimen: n/a Most Recent Eye Exam: 08/2015 Weight trend: stable Current exercise: walking  ------------------------------------------------------------------------   Hypertension, follow-up:  BP Readings from Last 3 Encounters:  11/08/15 128/64  07/27/15 122/60  07/19/15 120/60    He was last seen for hypertension 4 months ago.  BP at that visit was 122/60. Management since that visit includes none.He reports good compliance with treatment. He is not having side effects.  He is exercising. He is adherent to low salt diet.   Outside blood pressures are 130's-140's/ 60-70's . He is experiencing dyspnea. This has been an ongoing issue and not changed.  Patient denies chest pain, chest pressure/discomfort, claudication, exertional chest pressure/discomfort, fatigue, irregular heart beat, lower extremity edema, near-syncope, orthopnea, palpitations and paroxysmal nocturnal dyspnea.   Cardiovascular risk factors include advanced age (older than 47 for men, 73 for women), diabetes mellitus, dyslipidemia, hypertension and male gender.  ------------------------------------------------------------------------  Pt is having trouble sleeping. He reports that he sleeps about 5 hours and then he is wide awake. He wants to know if Avionol PM is ok to take. He saw this on Dr. Irena Cords and is said to be good for sleep.    Prior to Admission  medications   Medication Sig Start Date End Date Taking? Authorizing Provider  Ascorbic Acid (VITAMIN C) 500 MG tablet Take 500 mg by mouth daily.     Yes Historical Provider, MD  aspirin 81 MG tablet Take 81 mg by mouth daily.     Yes Historical Provider, MD  cloNIDine (CATAPRES) 0.2 MG tablet TAKE 1 TABLET (0.2 MG TOTAL) BY MOUTH 2 (TWO) TIMES DAILY. 06/30/15  Yes Minna Merritts, MD  fish oil-omega-3 fatty acids 1000 MG capsule Take 1 g by mouth daily.     Yes Historical Provider, MD  glucosamine-chondroitin 500-400 MG tablet Take 1 tablet by mouth daily.     Yes Historical Provider, MD  loratadine (CLARITIN) 10 MG tablet TAKE 1 TABLET BY MOUTH DAILY AS NEEDED 03/02/15  Yes Jerrol Banana., MD  metoprolol succinate (TOPROL-XL) 100 MG 24 hr tablet TAKE 1 TABLET (100 MG TOTAL) BY MOUTH DAILY. 09/20/15  Yes Minna Merritts, MD  Multiple Vitamin (MULTIVITAMIN) tablet Take 1 tablet by mouth daily.     Yes Historical Provider, MD  quinapril (ACCUPRIL) 40 MG tablet TAKE 1 TABLET (40 MG TOTAL) BY MOUTH 2 (TWO) TIMES DAILY. 07/16/15  Yes Minna Merritts, MD  simvastatin (ZOCOR) 10 MG tablet TAKE ONE TABLET BY MOUTH DAILY AT BEDTIME 09/07/14  Yes Minna Merritts, MD  Tamsulosin HCl (FLOMAX) 0.4 MG CAPS Take 0.4 mg by mouth daily.     Yes Historical Provider, MD  torsemide (DEMADEX) 20 MG tablet TAKE 1 TABLET BY MOUTH 2  TIMES DAILY AS NEEDED. 08/03/15  Yes Minna Merritts, MD  warfarin (COUMADIN) 5 MG tablet TAKE AS DIRECTED BY COUMADIN CLINIC 09/06/15  Yes Christia Reading  Gloriajean Dell, MD    Patient Active Problem List   Diagnosis Date Noted  . Actinic keratoses 12/10/2014  . Allergic rhinitis 12/10/2014  . A-fib ([place]) 12/10/2014  . CCF (congestive cardiac failure) ([place]) 12/10/2014  . Acid reflux 12/10/2014  . HLD (hyperlipidemia) 12/10/2014  . BP (high blood pressure) 12/10/2014  . Arthritis, degenerative 12/10/2014  . Diabetes mellitus, type 2 ([place]) 12/10/2014  . Chronic renal insufficiency  03/05/2014  . Status post nephrectomy 03/05/2014  . Chronic diastolic CHF (congestive heart failure) ([place]) 10/13/2013  . Pre-op evaluation 09/17/2013  . Permanent atrial fibrillation ([place]) 09/17/2013  . Long term current use of anticoagulant 10/25/2010  . Shortness of breath 11/01/2009  . CAD, NATIVE VESSEL 10/22/2009  . HYPERTENSION, BENIGN 03/09/2009  . RBBB 12/29/2008  . EDEMA 12/29/2008    Past Medical History  Diagnosis Date  . Atrial fibrillation (HCC)     coumadin therapy  . Coronary artery disease     native vessel  . Hypertension   . RBBB (right bundle branch block)   . Edema   . Squamous cell carcinoma ([place])     left hand & right ear     Social History   Social History  . Marital Status: Married    Spouse Name: N/A  . Number of Children: 3  . 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 Smoking status: Former Research scientist (life sciences)  . Smokeless tobacco: Not on file  . Alcohol Use: 2.4 - 4.8 oz/week    4-8 Standard drinks or equivalent per week  . Drug Use: No  . Sexual Activity: Not on file   Other Topics Concern  . Not on file   Social History Narrative   He lives with his wife.          Allergies  Allergen Reactions  . Pollen Extract     Review of Systems  Constitutional: Negative.   HENT: Negative.   Eyes: Negative.   Respiratory: Positive for shortness of breath.   Cardiovascular: Negative.   Gastrointestinal: Negative.   Genitourinary: Negative.   Musculoskeletal: Negative.   Skin: Negative.   Neurological: Negative.   Endo/Heme/Allergies: Negative.   Psychiatric/Behavioral: Negative.     Immunization History  Administered Date(s) Administered  . Influenza, High Dose Seasonal PF 05/27/2015  . Pneumococcal Conjugate-13 05/12/2014  . Pneumococcal Polysaccharide-23 06/07/1996  . Tdap 04/03/2011   Objective:  BP 128/64 mmHg  Pulse 64  Temp(Src) 97.7 F (36.5 C) (Oral)   Resp 18  Wt 202 lb (91.627 kg)  Physical Exam  Constitutional: He is oriented to person, place, and time and well-developed, well-nourished, and in no distress.  HENT:  Head: Normocephalic and atraumatic.  Eyes: Conjunctivae and EOM are normal. Pupils are equal, round, and reactive to light.  Neck: Normal range of motion. Neck supple.  Cardiovascular: Normal rate, regular rhythm, normal heart sounds and intact distal pulses.   Pulmonary/Chest: Effort normal and breath sounds normal.  Musculoskeletal: He exhibits edema (2+ ).  Neurological: He is alert and oriented to person, place, and time. He has normal reflexes. Gait normal. GCS score is 15.  Skin: Skin is warm and dry.  Psychiatric: Mood, memory, affect and judgment normal.    Lab Results  Component Value Date   WBC 5.6 12/12/2013   HGB 14.9 12/12/2013   HCT 43 12/12/2013   PLT 151 12/12/2013   GLUCOSE 97 09/29/2013  CHOL 116 08/04/2015   TRIG 66 08/04/2015   HDL 52 08/04/2015   LDLCALC 51 08/04/2015   TSH 2.290 08/04/2015   INR 2.6 10/13/2015   HGBA1C 6.2 07/27/2015    CMP     Component Value Date/Time   NA 138 12/12/2013   NA 141 02/12/2009 0000   K 3.9 12/12/2013   CL 103 09/29/2013 0000   CO2 22 09/29/2013 0000   GLUCOSE 97 09/29/2013 0000   GLUCOSE 118* 09/21/2008 1139   BUN 28* 12/12/2013   BUN 16 02/12/2009 0000   CREATININE 1.5* 12/12/2013   CREATININE 1.29* 09/29/2013 0000   CALCIUM 8.9 09/29/2013 0000   ALBUMIN 4.2 02/12/2009 0000   AST 25 12/12/2013   ALT 23 12/12/2013   ALKPHOS 63 12/12/2013   GFRNONAA 51* 09/29/2013 0000   GFRAA 59* 09/29/2013 0000    Assessment and Plan :  1. Type 2 diabetes mellitus with other circulatory complication (HCC) Stable - POCT HgB A1C -6.5  2. HYPERTENSION, BENIGN Stable. 3. HLD (hyperlipidemia)   4. Insomnia Try melatonin or tylenol pm. If does not improve will try Trazodone. Consider sleep study. Pt will ask family member to stay with him and see if  he snores at night.  I have done the exam and reviewed the above chart and it is accurate to the best of my knowledge.  Patient was seen and examined by Dr. Miguel Aschoff, and noted scribed by Webb Laws, [place] MD [facility] 11/08/2015 10:32 AM

## 2015-11-08 NOTE — Patient Instructions (Addendum)
Try Melotonin for sleep first. If does not improve sleep, take Tylenol or we can call in a prescription if these do not work.    DASH Eating Plan DASH stands for "Dietary Approaches to Stop Hypertension." The DASH eating plan is a healthy eating plan that has been shown to reduce high blood pressure (hypertension). Additional health benefits may include reducing the risk of type 2 diabetes mellitus, heart disease, and stroke. The DASH eating plan may also help with weight loss. WHAT DO I NEED TO KNOW ABOUT THE DASH EATING PLAN? For the DASH eating plan, you will follow these general guidelines:  Choose foods with a percent daily value for sodium of less than 5% (as listed on the food label).  Use salt-free seasonings or herbs instead of table salt or sea salt.  Check with your health care provider or pharmacist before using salt substitutes.  Eat lower-sodium products, often labeled as "lower sodium" or "no salt added."  Eat fresh foods.  Eat more vegetables, fruits, and low-fat dairy products.  Choose whole grains. Look for the word "whole" as the first word in the ingredient list.  Choose fish and skinless chicken or [place] more often than red meat. Limit fish, poultry, and meat to 6 oz (170 g) each day.  Limit sweets, desserts, sugars, and sugary drinks.  Choose heart-healthy fats.  Limit cheese to 1 oz (28 g) per day.  Eat more home-cooked food and less restaurant, buffet, and fast food.  Limit fried foods.  Cook foods using methods other than frying.  Limit canned vegetables. If you do use them, rinse them well to decrease the sodium.  When eating at a restaurant, ask that your food be prepared with less salt, or no salt if possible. WHAT FOODS CAN I EAT? Seek help from a dietitian for individual calorie needs. Grains Whole grain or whole wheat bread. Brown rice. Whole grain or whole wheat pasta. Quinoa, bulgur, and whole grain cereals. Low-sodium cereals. Corn or whole  wheat flour tortillas. Whole grain cornbread. Whole grain crackers. Low-sodium crackers. Vegetables Fresh or frozen vegetables (raw, steamed, roasted, or grilled). Low-sodium or reduced-sodium tomato and vegetable juices. Low-sodium or reduced-sodium tomato sauce and paste. Low-sodium or reduced-sodium canned vegetables.  Fruits All fresh, canned (in natural juice), or frozen fruits. Meat and Other Protein Products Ground beef (85% or leaner), grass-fed beef, or beef trimmed of fat. Skinless chicken or [place]. Ground chicken or [place]. Pork trimmed of fat. All fish and seafood. Eggs. Dried beans, peas, or lentils. Unsalted nuts and seeds. Unsalted canned beans. Dairy Low-fat dairy products, such as skim or 1% milk, 2% or reduced-fat cheeses, low-fat ricotta or cottage cheese, or plain low-fat yogurt. Low-sodium or reduced-sodium cheeses. Fats and Oils Tub margarines without trans fats. Light or reduced-fat mayonnaise and salad dressings (reduced sodium). Avocado. Safflower, olive, or canola oils. Natural peanut or almond butter. Other Unsalted popcorn and pretzels. The items listed above may not be a complete list of recommended foods or beverages. Contact your dietitian for more options. WHAT FOODS ARE NOT RECOMMENDED? Grains White bread. White pasta. White rice. Refined cornbread. Bagels and croissants. Crackers that contain trans fat. Vegetables Creamed or fried vegetables. Vegetables in a cheese sauce. Regular canned vegetables. Regular canned tomato sauce and paste. Regular tomato and vegetable juices. Fruits Dried fruits. Canned fruit in light or heavy syrup. Fruit juice. Meat and Other Protein Products Fatty cuts of meat. Ribs, chicken wings, bacon, sausage, bologna, salami, chitterlings, fatback, hot dogs, bratwurst,  and packaged luncheon meats. Salted nuts and seeds. Canned beans with salt. Dairy Whole or 2% milk, cream, half-and-half, and cream cheese. Whole-fat or sweetened yogurt.  Full-fat cheeses or blue cheese. Nondairy creamers and whipped toppings. Processed cheese, cheese spreads, or cheese curds. Condiments Onion and garlic salt, seasoned salt, table salt, and sea salt. Canned and packaged gravies. Worcestershire sauce. Tartar sauce. Barbecue sauce. Teriyaki sauce. Soy sauce, including reduced sodium. Steak sauce. Fish sauce. Oyster sauce. Cocktail sauce. Horseradish. Ketchup and mustard. Meat flavorings and tenderizers. Bouillon cubes. Hot sauce. Tabasco sauce. Marinades. Taco seasonings. Relishes. Fats and Oils Butter, stick margarine, lard, shortening, ghee, and bacon fat. Coconut, palm kernel, or palm oils. Regular salad dressings. Other Pickles and olives. Salted popcorn and pretzels. The items listed above may not be a complete list of foods and beverages to avoid. Contact your dietitian for more information. WHERE CAN I FIND MORE INFORMATION? National Heart, Lung, and Blood Institute: travelstabloid.com   This information is not intended to replace advice given to you by your health care provider. Make sure you discuss any questions you have with your health care provider.   Document Released: 07/13/2011 Document Revised: 08/14/2014 Document Reviewed: 05/28/2013 Elsevier Interactive Patient Education [employer].

## 2015-11-24 ENCOUNTER — Ambulatory Visit (INDEPENDENT_AMBULATORY_CARE_PROVIDER_SITE_OTHER): Payer: PPO

## 2015-11-24 DIAGNOSIS — Z7901 Long term (current) use of anticoagulants: Secondary | ICD-10-CM | POA: Diagnosis not present

## 2015-11-24 DIAGNOSIS — I4891 Unspecified atrial fibrillation: Secondary | ICD-10-CM | POA: Diagnosis not present

## 2015-11-24 LAB — POCT INR: INR: 2

## 2015-12-03 ENCOUNTER — Other Ambulatory Visit: Payer: Self-pay | Admitting: Cardiovascular Disease

## 2015-12-20 ENCOUNTER — Encounter: Payer: Self-pay | Admitting: Family Medicine

## 2015-12-20 ENCOUNTER — Ambulatory Visit (INDEPENDENT_AMBULATORY_CARE_PROVIDER_SITE_OTHER): Payer: PPO | Admitting: Family Medicine

## 2015-12-20 VITALS — BP 128/56 | HR 62 | Temp 97.7°F | Resp 18 | Wt 201.0 lb

## 2015-12-20 DIAGNOSIS — I878 Other specified disorders of veins: Secondary | ICD-10-CM | POA: Diagnosis not present

## 2015-12-20 DIAGNOSIS — R600 Localized edema: Secondary | ICD-10-CM | POA: Diagnosis not present

## 2015-12-20 NOTE — Progress Notes (Signed)
Patient ID: [name], male   DOB: Dec 20, 1927, 82 y.o.   MRN: [id number]    Subjective:  HPI Pt reports that he woke up Friday morning (3 days ago) and both his legs were swollen. The swelling has done down today but he has sores on both legs that he is concerned about. He has never had these before. He normally takes Torsemide twice daily and did up until 2 days ago, he is only taking one and the swelling has gotten better. His legs do not hurt, but they feel tight.  He has no chest pain, chest tightness, no orthopnea or PND. Prior to Admission medications   Medication Sig Start Date End Date Taking? Authorizing Provider  Ascorbic Acid (VITAMIN C) 500 MG tablet Take 500 mg by mouth daily.     Yes Historical Provider, MD  aspirin 81 MG tablet Take 81 mg by mouth daily.     Yes Historical Provider, MD  cloNIDine (CATAPRES) 0.2 MG tablet TAKE 1 TABLET (0.2 MG TOTAL) BY MOUTH 2 (TWO) TIMES DAILY. 06/30/15  Yes Minna Merritts, MD  fish oil-omega-3 fatty acids 1000 MG capsule Take 1 g by mouth daily.     Yes Historical Provider, MD  glucosamine-chondroitin 500-400 MG tablet Take 1 tablet by mouth daily.     Yes Historical Provider, MD  loratadine (CLARITIN) 10 MG tablet TAKE 1 TABLET BY MOUTH DAILY AS NEEDED 03/02/15  Yes Jerrol Banana., MD  Melatonin 10 MG TABS Take 10 mg by mouth daily.   Yes Historical Provider, MD  metoprolol succinate (TOPROL-XL) 100 MG 24 hr tablet TAKE 1 TABLET (100 MG TOTAL) BY MOUTH DAILY. 09/20/15  Yes Minna Merritts, MD  Multiple Vitamin (MULTIVITAMIN) tablet Take 1 tablet by mouth daily.     Yes Historical Provider, MD  quinapril (ACCUPRIL) 40 MG tablet TAKE 1 TABLET (40 MG TOTAL) BY MOUTH 2 (TWO) TIMES DAILY. 07/16/15  Yes Minna Merritts, MD  simvastatin (ZOCOR) 10 MG tablet TAKE ONE TABLET BY MOUTH DAILY AT BEDTIME 12/06/15  Yes Minna Merritts, MD  Tamsulosin HCl (FLOMAX) 0.4 MG CAPS Take 0.4 mg by mouth daily.     Yes Historical Provider, MD  torsemide  (DEMADEX) 20 MG tablet TAKE 1 TABLET BY MOUTH 2  TIMES DAILY AS NEEDED. 08/03/15  Yes Minna Merritts, MD  warfarin (COUMADIN) 5 MG tablet TAKE AS DIRECTED BY COUMADIN CLINIC 09/06/15   Minna Merritts, MD    Patient Active Problem List   Diagnosis Date Noted  . Actinic keratoses 12/10/2014  . Allergic rhinitis 12/10/2014  . A-fib ([place]) 12/10/2014  . CCF (congestive cardiac failure) ([place]) 12/10/2014  . Acid reflux 12/10/2014  . HLD (hyperlipidemia) 12/10/2014  . BP (high blood pressure) 12/10/2014  . Arthritis, degenerative 12/10/2014  . Diabetes mellitus, type 2 ([place]) 12/10/2014  . Chronic renal insufficiency 03/05/2014  . Status post nephrectomy 03/05/2014  . Chronic diastolic CHF (congestive heart failure) ([place]) 10/13/2013  . Pre-op evaluation 09/17/2013  . Permanent atrial fibrillation ([place]) 09/17/2013  . Long term current use of anticoagulant 10/25/2010  . Shortness of breath 11/01/2009  . CAD, NATIVE VESSEL 10/22/2009  . HYPERTENSION, BENIGN 03/09/2009  . RBBB 12/29/2008  . EDEMA 12/29/2008    Past Medical History  Diagnosis Date  . Atrial fibrillation (HCC)     coumadin therapy  . Coronary artery disease     native vessel  . Hypertension   . RBBB (right bundle branch block)   .  Edema   . Squamous cell carcinoma ([place])     left hand & right ear     Social History   Social History  . Marital Status: Married    Spouse Name: N/A  . Number of Children: 3  . 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 Smoking status: Former Research scientist (life sciences)  . Smokeless tobacco: Not on file  . Alcohol Use: 2.4 - 4.8 oz/week    4-8 Standard drinks or equivalent per week  . Drug Use: No  . Sexual Activity: Not on file   Other Topics Concern  . Not on file   Social History Narrative   He lives with his wife.          Allergies  Allergen Reactions  . Pollen Extract     Review of Systems  Constitutional:  Negative.   HENT: Negative.   Eyes: Negative.   Respiratory: Positive for shortness of breath (nothing new for pt. Has not changed.).   Cardiovascular: Positive for leg swelling.  Gastrointestinal: Negative.   Musculoskeletal: Negative.   Skin: Negative.   Neurological: Negative.   Endo/Heme/Allergies: Negative.   Psychiatric/Behavioral: Negative.     Immunization History  Administered Date(s) Administered  . Influenza, High Dose Seasonal PF 05/27/2015  . Pneumococcal Conjugate-13 05/12/2014  . Pneumococcal Polysaccharide-23 06/07/1996  . Tdap 04/03/2011   Objective:  BP 128/56 mmHg  Pulse 62  Temp(Src) 97.7 F (36.5 C) (Oral)  Resp 18  Wt 201 lb (91.173 kg)  SpO2 94%  Physical Exam  Constitutional: He is oriented to person, place, and time and well-developed, well-nourished, and in no distress.  HENT:  Head: Normocephalic and atraumatic.  Right Ear: External ear normal.  Left Ear: External ear normal.  Nose: Nose normal.  Eyes: Conjunctivae are normal.  Neck: Normal range of motion. Neck supple.  Cardiovascular: Normal rate, regular rhythm, normal heart sounds and intact distal pulses.   Pulmonary/Chest: Effort normal and breath sounds normal.  Abdominal: Soft.  Musculoskeletal: He exhibits edema (2+ edema).  Negative Hinmans and negative for cording. 2+ lower extremity edema  Neurological: He is alert and oriented to person, place, and time. He has normal reflexes. Gait normal. GCS score is 15.  Skin: Skin is warm and dry.  Psychiatric: Mood, memory, affect and judgment normal.    Lab Results  Component Value Date   WBC 5.6 12/12/2013   HGB 14.9 12/12/2013   HCT 43 12/12/2013   PLT 151 12/12/2013   GLUCOSE 97 09/29/2013   CHOL 116 08/04/2015   TRIG 66 08/04/2015   HDL 52 08/04/2015   LDLCALC 51 08/04/2015   TSH 2.290 08/04/2015   INR 2.0 11/24/2015   HGBA1C 6.5 11/08/2015    CMP     Component Value Date/Time   NA 138 12/12/2013   NA 141 02/12/2009  0000   K 3.9 12/12/2013   CL 103 09/29/2013 0000   CO2 22 09/29/2013 0000   GLUCOSE 97 09/29/2013 0000   GLUCOSE 118* 09/21/2008 1139   BUN 28* 12/12/2013   BUN 16 02/12/2009 0000   CREATININE 1.5* 12/12/2013   CREATININE 1.29* 09/29/2013 0000   CALCIUM 8.9 09/29/2013 0000   ALBUMIN 4.2 02/12/2009 0000   AST 25 12/12/2013   ALT 23 12/12/2013   ALKPHOS 63 12/12/2013   GFRNONAA 51* 09/29/2013 0000   GFRAA 59* 09/29/2013 0000    Assessment and Plan :  1. Bilateral edema of lower extremity Improving. Possible acute venous stasis. Wear support hose. Continue dash diet.  - Comprehensive metabolic panel Clinically I do not think that this is CHF. 2. Venous stasis Patient encouraged to elevate his feet and to wear support hose during the day. Offered and will consider referral to vascular for evaluation. 3. CAD All risk factors treated. 4. Atrial fibrillation To be contributing to today's problem. Patient was seen and examined by Dr. Miguel Aschoff, and noted scribed by Webb Laws, [place] MD [facility] 12/20/2015 10:48 AM

## 2015-12-21 LAB — COMPREHENSIVE METABOLIC PANEL
ALBUMIN: 4.1 g/dL (ref 3.5–4.7)
ALT: 21 IU/L (ref 0–44)
AST: 22 IU/L (ref 0–40)
Albumin/Globulin Ratio: 1.9 (ref 1.2–2.2)
Alkaline Phosphatase: 70 IU/L (ref 39–117)
BILIRUBIN TOTAL: 0.9 mg/dL (ref 0.0–1.2)
BUN / CREAT RATIO: 16 (ref 10–24)
BUN: 21 mg/dL (ref 8–27)
CHLORIDE: 102 mmol/L (ref 96–106)
CO2: 26 mmol/L (ref 18–29)
CREATININE: 1.29 mg/dL — AB (ref 0.76–1.27)
Calcium: 9.1 mg/dL (ref 8.6–10.2)
GFR calc Af Amer: 58 mL/min/{1.73_m2} — ABNORMAL LOW (ref >59)
GFR calc non Af Amer: 50 mL/min/{1.73_m2} — ABNORMAL LOW (ref >59)
GLOBULIN, TOTAL: 2.2 g/dL (ref 1.5–4.5)
Glucose: 159 mg/dL — ABNORMAL HIGH (ref 65–99)
POTASSIUM: 4.2 mmol/L (ref 3.5–5.2)
SODIUM: 142 mmol/L (ref 134–144)
Total Protein: 6.3 g/dL (ref 6.0–8.5)

## 2015-12-27 ENCOUNTER — Other Ambulatory Visit: Payer: Self-pay | Admitting: Cardiovascular Disease

## 2015-12-29 DIAGNOSIS — N401 Enlarged prostate with lower urinary tract symptoms: Secondary | ICD-10-CM | POA: Diagnosis not present

## 2015-12-29 DIAGNOSIS — R351 Nocturia: Secondary | ICD-10-CM | POA: Diagnosis not present

## 2015-12-29 DIAGNOSIS — Z85528 Personal history of other malignant neoplasm of kidney: Secondary | ICD-10-CM | POA: Diagnosis not present

## 2015-12-29 DIAGNOSIS — R350 Frequency of micturition: Secondary | ICD-10-CM | POA: Diagnosis not present

## 2015-12-29 DIAGNOSIS — R972 Elevated prostate specific antigen [PSA]: Secondary | ICD-10-CM | POA: Diagnosis not present

## 2015-12-29 DIAGNOSIS — R3914 Feeling of incomplete bladder emptying: Secondary | ICD-10-CM | POA: Diagnosis not present

## 2016-01-04 DIAGNOSIS — D225 Melanocytic nevi of trunk: Secondary | ICD-10-CM | POA: Diagnosis not present

## 2016-01-04 DIAGNOSIS — D485 Neoplasm of uncertain behavior of skin: Secondary | ICD-10-CM | POA: Diagnosis not present

## 2016-01-04 DIAGNOSIS — L570 Actinic keratosis: Secondary | ICD-10-CM | POA: Diagnosis not present

## 2016-01-04 DIAGNOSIS — Z85828 Personal history of other malignant neoplasm of skin: Secondary | ICD-10-CM | POA: Diagnosis not present

## 2016-01-04 DIAGNOSIS — Z85820 Personal history of malignant melanoma of skin: Secondary | ICD-10-CM | POA: Diagnosis not present

## 2016-01-04 DIAGNOSIS — D2271 Melanocytic nevi of right lower limb, including hip: Secondary | ICD-10-CM | POA: Diagnosis not present

## 2016-01-04 DIAGNOSIS — C44319 Basal cell carcinoma of skin of other parts of face: Secondary | ICD-10-CM | POA: Diagnosis not present

## 2016-01-04 DIAGNOSIS — X32XXXA Exposure to sunlight, initial encounter: Secondary | ICD-10-CM | POA: Diagnosis not present

## 2016-01-05 ENCOUNTER — Ambulatory Visit (INDEPENDENT_AMBULATORY_CARE_PROVIDER_SITE_OTHER): Payer: PPO

## 2016-01-05 DIAGNOSIS — I4891 Unspecified atrial fibrillation: Secondary | ICD-10-CM

## 2016-01-05 DIAGNOSIS — Z7901 Long term (current) use of anticoagulants: Secondary | ICD-10-CM | POA: Diagnosis not present

## 2016-01-05 LAB — POCT INR: INR: 1.9

## 2016-01-17 ENCOUNTER — Other Ambulatory Visit: Payer: Self-pay

## 2016-01-17 MED ORDER — GLUCOSE BLOOD VI STRP: ORAL_STRIP | Status: DC

## 2016-01-18 DIAGNOSIS — N401 Enlarged prostate with lower urinary tract symptoms: Secondary | ICD-10-CM | POA: Diagnosis not present

## 2016-01-20 DIAGNOSIS — R351 Nocturia: Secondary | ICD-10-CM | POA: Diagnosis not present

## 2016-01-20 DIAGNOSIS — N401 Enlarged prostate with lower urinary tract symptoms: Secondary | ICD-10-CM | POA: Diagnosis not present

## 2016-01-26 ENCOUNTER — Other Ambulatory Visit: Payer: Self-pay | Admitting: Family Medicine

## 2016-01-31 ENCOUNTER — Encounter: Payer: Self-pay | Admitting: Cardiovascular Disease

## 2016-01-31 ENCOUNTER — Ambulatory Visit (INDEPENDENT_AMBULATORY_CARE_PROVIDER_SITE_OTHER): Payer: PPO | Admitting: Cardiovascular Disease

## 2016-01-31 VITALS — BP 138/78 | HR 55 | Ht 67.0 in | Wt 192.0 lb

## 2016-01-31 DIAGNOSIS — E785 Hyperlipidemia, unspecified: Secondary | ICD-10-CM | POA: Diagnosis not present

## 2016-01-31 DIAGNOSIS — I2510 Atherosclerotic heart disease of native coronary artery without angina pectoris: Secondary | ICD-10-CM

## 2016-01-31 DIAGNOSIS — I5032 Chronic diastolic (congestive) heart failure: Secondary | ICD-10-CM | POA: Diagnosis not present

## 2016-01-31 DIAGNOSIS — R601 Generalized edema: Secondary | ICD-10-CM | POA: Diagnosis not present

## 2016-01-31 DIAGNOSIS — I4891 Unspecified atrial fibrillation: Secondary | ICD-10-CM | POA: Diagnosis not present

## 2016-01-31 DIAGNOSIS — I10 Essential (primary) hypertension: Secondary | ICD-10-CM

## 2016-01-31 DIAGNOSIS — E1159 Type 2 diabetes mellitus with other circulatory complications: Secondary | ICD-10-CM

## 2016-01-31 NOTE — Patient Instructions (Addendum)
Medication Instructions:   No medication changes  Testing/Procedures:  Right lower extremity venous duplex to look for DVT  Try compression and ACVE wraps for leg swelling  Follow-Up: It was a pleasure seeing you in the office today. Please call us if you have new issues that need to be addressed before your next appt.  [phone]  Your physician wants you to follow-up in: 6 months.  You will receive a reminder letter in the mail two months in advance. If you don't receive a letter, please call our office to schedule the follow-up appointment.  If you need a refill on your cardiac medications before your next appointment, please call your pharmacy.

## 2016-01-31 NOTE — Progress Notes (Signed)
Patient ID: MARQUEL DENT, male   DOB: 02-25-1928, 82 y.o.   MRN: [id number] Cardiology Office Note  Date:  01/31/2016   ID:  Vermon, Fletcher July 23, 1928, MRN [id number]  PCP:  Wilhemena Durie, MD   Chief Complaint  Patient presents with  . other    6 month f/u c/o swelling. Meds reviewed verbally.    HPI:  SHONDEL DAWDY is a 82 y.o. male w/ PMHx s/f CAD s/p CABG 17 years ago, permanent atrial fibrillation, chronic Coumadin anticoagulation, chronic RBBB, HTN and chronic LE edema. He presents for routine followup of his coronary artery disease He has a single kidney.   In follow-up today, he is taking torsemide 20 mg daily Reports having continued swelling of the lower extremities worse on the right than the left Some skin breakdown, weeping Unable to tolerate compression hose. He has ordered additional set and will try these again Otherwise active, no complaints of chest pain on exertion He does have chronic mild shortness of breath, no change Difficulty with breathing when he eats too much, bends over No recent falls Blood pressure elevated on initial visit, improved on recheck  EKG on today's visit shows atrial fibrillation with ventricular rate 55 bpm, right bundle branch block, nonspecific ST and T wave abnormality. No change to his EKG  Other past medical history Prior lab work, Hemoglobin A1c 6.2, total cholesterol in 2014 was 129 seen by Dr. Abigail Butts, renal service  Seen in clinic February 2015 at which time he had significant leg edema. Amlodipine was held Echocardiogram around that time showed normal LV systolic function, moderate pulmonary hypertension with pleural effusion on the left Also had severe hypertension.   PMH:   has a past medical history of Atrial fibrillation ([place]); Coronary artery disease; Hypertension; RBBB (right bundle branch block); Edema; and Squamous cell carcinoma ([place]).  PSH:    Past Surgical History  Procedure Laterality Date  .  Cardioversion  09/21/08  . Appendectomy    . Squamous cell carcinoma excision      left hand & right ear.   . Prostate ablation      transurethral needle ablation  . Coronary artery bypass graft    . Nephrectomy    . Hernia repair    . Melanoma excision      back    Current Outpatient Prescriptions  Medication Sig Dispense Refill  . Ascorbic Acid (VITAMIN C) 500 MG tablet Take 500 mg by mouth daily.      Marland Kitchen aspirin 81 MG tablet Take 81 mg by mouth daily.      . cloNIDine (CATAPRES) 0.2 MG tablet TAKE 1 TABLET (0.2 MG TOTAL) BY MOUTH 2 (TWO) TIMES DAILY. 180 tablet 3  . fish oil-omega-3 fatty acids 1000 MG capsule Take 1 g by mouth daily.      Marland Kitchen glucosamine-chondroitin 500-400 MG tablet Take 1 tablet by mouth daily.      Marland Kitchen glucose blood test strip Test once daily DX E11.9 50 each 12  . loratadine (CLARITIN) 10 MG tablet TAKE 1 TABLET BY MOUTH DAILY AS NEEDED 30 tablet 12  . Melatonin 10 MG TABS Take 10 mg by mouth daily.    . metoprolol succinate (TOPROL-XL) 100 MG 24 hr tablet TAKE 1 TABLET (100 MG TOTAL) BY MOUTH DAILY. 30 tablet 6  . Multiple Vitamin (MULTIVITAMIN) tablet Take 1 tablet by mouth daily.      . quinapril (ACCUPRIL) 40 MG tablet TAKE 1 TABLET (40 MG TOTAL)  BY MOUTH 2 (TWO) TIMES DAILY. 180 tablet 3  . simvastatin (ZOCOR) 10 MG tablet TAKE ONE TABLET BY MOUTH DAILY AT BEDTIME 90 tablet 3  . Tamsulosin HCl (FLOMAX) 0.4 MG CAPS Take 0.4 mg by mouth daily.      Marland Kitchen torsemide (DEMADEX) 20 MG tablet TAKE 1 TABLET BY MOUTH 2  TIMES DAILY AS NEEDED. (Patient taking differently: TAKE 1 TABLET BY MOUTH DAILY.) 60 tablet 3  . warfarin (COUMADIN) 5 MG tablet TAKE AS DIRECTED BY COUMADIN CLINIC 90 tablet 0   No current facility-administered medications for this visit.     Allergies:   Pollen extract   Social History:  The patient  reports that he has quit smoking. He does not have any smokeless tobacco history on file. He reports that he drinks about 2.4 - 4.8 oz of alcohol per  week. He reports that he does not use illicit drugs.   Family History:   family history includes Arthritis in his mother; Heart attack in his father. There is no history of Other.    Review of Systems: Review of Systems  Constitutional: Negative.   Respiratory: Negative.   Cardiovascular: Positive for leg swelling.  Gastrointestinal: Negative.   Musculoskeletal: Negative.   Neurological: Negative.   Psychiatric/Behavioral: Negative.   All other systems reviewed and are negative.    PHYSICAL EXAM: VS:  BP 138/78 mmHg  Pulse 55  Ht 5\' 7"  (1.702 m)  Wt 192 lb (87.091 kg)  BMI 30.06 kg/m2 , BMI Body mass index is 30.06 kg/(m^2). GEN: Well nourished, well developed, in no acute distress, obese HEENT: normal Neck: no JVD, carotid bruits, or masses Cardiac: RRR; no murmurs, rubs, or gallops, nonpitting edema, right leg worse than left, signs of chronic stasis Scant weeping right lower extremity Respiratory:  clear to auscultation bilaterally, normal work of breathing GI: soft, nontender, nondistended, + BS MS: no deformity or atrophy Skin: warm and dry, no rash Neuro:  Strength and sensation are intact Psych: euthymic mood, full affect    Recent Labs: 08/04/2015: TSH 2.290 12/20/2015: ALT 21; BUN 21; Creatinine, Ser 1.29*; Potassium 4.2; Sodium 142    Lipid Panel Lab Results  Component Value Date   CHOL 116 08/04/2015   HDL 52 08/04/2015   LDLCALC 51 08/04/2015   TRIG 66 08/04/2015      Wt Readings from Last 3 Encounters:  01/31/16 192 lb (87.091 kg)  12/20/15 201 lb (91.173 kg)  11/08/15 202 lb (91.627 kg)       ASSESSMENT AND PLAN:  Atrial fibrillation, unspecified type (HCC) -  Heart rate relatively well-controlled, mild bradycardia, asymptomatic Tolerating anticoagulation. No changes to his medications  Generalized edema - Plan: VAS [place] LOWER EXTREMITY VENOUS (DVT) significant leg edema, worse on the right than the left likely secondary to venous  insufficiency  Previous donor vein site right lower extremity likely exacerbating his symptoms We have ordered right lower extremity Doppler to exclude DVT, low likelihood  HLD (hyperlipidemia) Cholesterol is at goal on the current lipid regimen. No changes to the medications were made.  Chronic diastolic CHF (congestive heart failure) (HCC) Encouraged torsemide 20 mg daily Extra dose in the afternoon only for severe leg edema, shortness of breath  Atherosclerosis of native coronary artery of native heart without angina pectoris Currently with no symptoms of angina. No further workup at this time. Continue current medication regimen.  HYPERTENSION, BENIGN Blood pressure is well controlled on today's visit. No changes made to the medications.  Type  2 diabetes mellitus with other circulatory complication ([place]) We have encouraged continued exercise, careful diet management in an effort to lose weight.    Total encounter time more than 25 minutes  Greater than 50% was spent in counseling and coordination of care with the patient   Disposition:   F/U  6 months   Orders Placed This Encounter  Procedures  . EKG 12-Lead     Signed, Esmond Plants, M.D., Ph.D. 01/31/2016  [place], [place]

## 2016-02-02 ENCOUNTER — Ambulatory Visit (INDEPENDENT_AMBULATORY_CARE_PROVIDER_SITE_OTHER): Payer: PPO

## 2016-02-02 DIAGNOSIS — R601 Generalized edema: Secondary | ICD-10-CM

## 2016-02-02 DIAGNOSIS — I4891 Unspecified atrial fibrillation: Secondary | ICD-10-CM

## 2016-02-02 DIAGNOSIS — R061 Stridor: Secondary | ICD-10-CM | POA: Diagnosis not present

## 2016-02-02 DIAGNOSIS — I5032 Chronic diastolic (congestive) heart failure: Secondary | ICD-10-CM

## 2016-02-15 DIAGNOSIS — C44212 Basal cell carcinoma of skin of right ear and external auricular canal: Secondary | ICD-10-CM | POA: Diagnosis not present

## 2016-02-15 DIAGNOSIS — L905 Scar conditions and fibrosis of skin: Secondary | ICD-10-CM | POA: Diagnosis not present

## 2016-02-15 DIAGNOSIS — C44319 Basal cell carcinoma of skin of other parts of face: Secondary | ICD-10-CM | POA: Diagnosis not present

## 2016-02-16 ENCOUNTER — Ambulatory Visit (INDEPENDENT_AMBULATORY_CARE_PROVIDER_SITE_OTHER): Payer: PPO | Admitting: *Deleted

## 2016-02-16 DIAGNOSIS — I4891 Unspecified atrial fibrillation: Secondary | ICD-10-CM

## 2016-02-16 DIAGNOSIS — Z7901 Long term (current) use of anticoagulants: Secondary | ICD-10-CM

## 2016-02-16 LAB — POCT INR: INR: 1.8

## 2016-03-01 ENCOUNTER — Other Ambulatory Visit: Payer: Self-pay | Admitting: *Deleted

## 2016-03-01 ENCOUNTER — Ambulatory Visit (INDEPENDENT_AMBULATORY_CARE_PROVIDER_SITE_OTHER): Payer: PPO

## 2016-03-01 DIAGNOSIS — Z7901 Long term (current) use of anticoagulants: Secondary | ICD-10-CM | POA: Diagnosis not present

## 2016-03-01 DIAGNOSIS — I4891 Unspecified atrial fibrillation: Secondary | ICD-10-CM

## 2016-03-01 LAB — POCT INR: INR: 2.6

## 2016-03-01 MED ORDER — WARFARIN SODIUM 5 MG PO TABS: ORAL_TABLET | ORAL | 0 refills | Status: DC

## 2016-03-20 ENCOUNTER — Ambulatory Visit: Payer: PPO | Admitting: Family Medicine

## 2016-03-21 ENCOUNTER — Ambulatory Visit (INDEPENDENT_AMBULATORY_CARE_PROVIDER_SITE_OTHER): Payer: PPO | Admitting: Family Medicine

## 2016-03-21 ENCOUNTER — Encounter: Payer: Self-pay | Admitting: Family Medicine

## 2016-03-21 VITALS — BP 132/62 | HR 56 | Temp 97.7°F | Resp 16 | Wt 192.0 lb

## 2016-03-21 DIAGNOSIS — G4700 Insomnia, unspecified: Secondary | ICD-10-CM | POA: Diagnosis not present

## 2016-03-21 DIAGNOSIS — I10 Essential (primary) hypertension: Secondary | ICD-10-CM | POA: Diagnosis not present

## 2016-03-21 DIAGNOSIS — E1159 Type 2 diabetes mellitus with other circulatory complications: Secondary | ICD-10-CM | POA: Diagnosis not present

## 2016-03-21 LAB — POCT GLYCOSYLATED HEMOGLOBIN (HGB A1C): Hemoglobin A1C: 5.9

## 2016-03-21 LAB — POCT UA - MICROALBUMIN: MICROALBUMIN (UR) POC: 100 mg/L

## 2016-03-21 MED ORDER — TRAZODONE HCL 50 MG PO TABS: 50.0000 mg | ORAL_TABLET | Freq: Every day | ORAL | 12 refills | Status: DC

## 2016-03-21 NOTE — Progress Notes (Signed)
Subjective:  HPI  Diabetes Mellitus Type II, Follow-up:   Lab Results  Component Value Date   HGBA1C 5.9 03/21/2016   HGBA1C 6.5 11/08/2015   HGBA1C 6.2 07/27/2015    Last seen for diabetes 4 months ago.  Management since then includes none. He reports good compliance with treatment. He is not having side effects.  Current symptoms include none and have been unchanged. Home blood sugar records: 90's-120's  Episodes of hypoglycemia? no   Current Insulin Regimen: n/a Most Recent Eye Exam: 09/2015 Weight trend: stable Current exercise: walking  Pertinent Labs:    Component Value Date/Time   CHOL 116 08/04/2015 0907   TRIG 66 08/04/2015 0907   TRIG 46 02/12/2009 0000   HDL 52 08/04/2015 0907   LDLCALC 51 08/04/2015 0907   CREATININE 1.29 (H) 12/20/2015 1117    Wt Readings from Last 3 Encounters:  03/21/16 192 lb (87.1 kg)  01/31/16 192 lb (87.1 kg)  12/20/15 201 lb (91.2 kg)    ------------------------------------------------------------------------   Hypertension, follow-up:  BP Readings from Last 3 Encounters:  03/21/16 132/62  01/31/16 138/78  12/20/15 (!) 128/56    He was last seen for hypertension 4 months ago.  BP at that visit was 138/78. Management since that visit includes none. He reports good compliance with treatment. He is not having side effects.  He is exercising. He is adherent to low salt diet.     Wt Readings from Last 3 Encounters:  03/21/16 192 lb (87.1 kg)  01/31/16 192 lb (87.1 kg)  12/20/15 201 lb (91.2 kg)  ------------------------------------------------------------------------    Prior to Admission medications   Medication Sig Start Date End Date Taking? Authorizing Provider  Ascorbic Acid (VITAMIN C) 500 MG tablet Take 500 mg by mouth daily.      Historical Provider, MD  aspirin 81 MG tablet Take 81 mg by mouth daily.      Historical Provider, MD  cloNIDine (CATAPRES) 0.2 MG tablet TAKE 1 TABLET (0.2 MG TOTAL) BY  MOUTH 2 (TWO) TIMES DAILY. 12/27/15   Minna Merritts, MD  fish oil-omega-3 fatty acids 1000 MG capsule Take 1 g by mouth daily.      Historical Provider, MD  glucosamine-chondroitin 500-400 MG tablet Take 1 tablet by mouth daily.      Historical Provider, MD  glucose blood test strip Test once daily DX E11.9 01/17/16   Jerrol Banana., MD  loratadine (CLARITIN) 10 MG tablet TAKE 1 TABLET BY MOUTH DAILY AS NEEDED 01/28/16   Jerrol Banana., MD  Melatonin 10 MG TABS Take 10 mg by mouth daily.    Historical Provider, MD  metoprolol succinate (TOPROL-XL) 100 MG 24 hr tablet TAKE 1 TABLET (100 MG TOTAL) BY MOUTH DAILY. 09/20/15   Minna Merritts, MD  Multiple Vitamin (MULTIVITAMIN) tablet Take 1 tablet by mouth daily.      Historical Provider, MD  quinapril (ACCUPRIL) 40 MG tablet TAKE 1 TABLET (40 MG TOTAL) BY MOUTH 2 (TWO) TIMES DAILY. 07/16/15   Minna Merritts, MD  simvastatin (ZOCOR) 10 MG tablet TAKE ONE TABLET BY MOUTH DAILY AT BEDTIME 12/06/15   Minna Merritts, MD  Tamsulosin HCl (FLOMAX) 0.4 MG CAPS Take 0.4 mg by mouth daily.      Historical Provider, MD  torsemide (DEMADEX) 20 MG tablet TAKE 1 TABLET BY MOUTH 2  TIMES DAILY AS NEEDED. Patient taking differently: TAKE 1 TABLET BY MOUTH DAILY. 08/03/15   Minna Merritts,  MD  warfarin (COUMADIN) 5 MG tablet TAKE AS DIRECTED BY COUMADIN CLINIC 03/01/16   Minna Merritts, MD    Patient Active Problem List   Diagnosis Date Noted  . Actinic keratoses 12/10/2014  . Allergic rhinitis 12/10/2014  . A-fib ([place]) 12/10/2014  . CCF (congestive cardiac failure) ([place]) 12/10/2014  . Acid reflux 12/10/2014  . HLD (hyperlipidemia) 12/10/2014  . BP (high blood pressure) 12/10/2014  . Arthritis, degenerative 12/10/2014  . Diabetes mellitus, type 2 ([place]) 12/10/2014  . Chronic renal insufficiency 03/05/2014  . Status post nephrectomy 03/05/2014  . Chronic diastolic CHF (congestive heart failure) ([place]) 10/13/2013  . Pre-op evaluation  09/17/2013  . Permanent atrial fibrillation ([place]) 09/17/2013  . Long term current use of anticoagulant 10/25/2010  . Shortness of breath 11/01/2009  . CAD, NATIVE VESSEL 10/22/2009  . HYPERTENSION, BENIGN 03/09/2009  . RBBB 12/29/2008  . EDEMA 12/29/2008    Past Medical History:  Diagnosis Date  . Atrial fibrillation (HCC)    coumadin therapy  . Coronary artery disease    native vessel  . Edema   . Hypertension   . RBBB (right bundle branch block)   . Squamous cell carcinoma ([place])    left hand & right ear     Social History   Social History  . Marital status: Married    Spouse name: N/A  . Number of children: 3  . Years of education: N/A   Occupational History  . 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 Smoking status: Former Research scientist (life sciences)  . Smokeless tobacco: Never Used  . Alcohol use 2.4 - 4.8 oz/week    4 - 8 Standard drinks or equivalent per week  . Drug use: No  . Sexual activity: Not on file   Other Topics Concern  . Not on file   Social History Narrative   He lives with his wife.          Allergies  Allergen Reactions  . Pollen Extract     Review of Systems  Constitutional: Negative.   HENT: Negative.   Eyes: Negative.   Respiratory: Positive for shortness of breath.   Cardiovascular: Negative.   Gastrointestinal: Negative.   Genitourinary: Negative.   Musculoskeletal: Negative.   Skin: Negative.   Neurological: Negative.   Endo/Heme/Allergies: Negative.   Psychiatric/Behavioral: Negative.     Immunization History  Administered Date(s) Administered  . Influenza, High Dose Seasonal PF 05/27/2015  . Pneumococcal Conjugate-13 05/12/2014  . Pneumococcal Polysaccharide-23 06/07/1996  . Tdap 04/03/2011   Objective:  BP 132/62 (BP Location: Left Arm, Patient Position: Sitting, Cuff Size: Normal)   Pulse (!) 56   Temp 97.7 F (36.5 C) (Oral)   Resp 16   Wt 192 lb (87.1 kg)   SpO2 94%   BMI 30.07 kg/m    Physical Exam  Constitutional: He is oriented to person, place, and time and well-developed, well-nourished, and in no distress.  Eyes: Conjunctivae and EOM are normal. Pupils are equal, round, and reactive to light.  Neck: Normal range of motion. Neck supple.  Cardiovascular: Normal rate, regular rhythm, normal heart sounds and intact distal pulses.   Pulmonary/Chest: Effort normal and breath sounds normal.  Musculoskeletal: Normal range of motion.  Neurological: He is alert and oriented to person, place, and time. He has normal reflexes. Gait normal. GCS score is 15.  Skin: Skin is warm and dry.  Psychiatric: Mood, memory, affect and judgment normal.    Lab Results  Component Value Date   WBC 5.6 12/12/2013   HGB 14.9 12/12/2013   HCT 43 12/12/2013   PLT 151 12/12/2013   GLUCOSE 159 (H) 12/20/2015   CHOL 116 08/04/2015   TRIG 66 08/04/2015   HDL 52 08/04/2015   LDLCALC 51 08/04/2015   TSH 2.290 08/04/2015   INR 2.6 03/01/2016   HGBA1C 5.9 03/21/2016    CMP     Component Value Date/Time   NA 142 12/20/2015 1117   K 4.2 12/20/2015 1117   CL 102 12/20/2015 1117   CO2 26 12/20/2015 1117   GLUCOSE 159 (H) 12/20/2015 1117   GLUCOSE 118 (H) 09/21/2008 1139   BUN 21 12/20/2015 1117   CREATININE 1.29 (H) 12/20/2015 1117   CALCIUM 9.1 12/20/2015 1117   PROT 6.3 12/20/2015 1117   ALBUMIN 4.1 12/20/2015 1117   AST 22 12/20/2015 1117   ALT 21 12/20/2015 1117   ALKPHOS 70 12/20/2015 1117   BILITOT 0.9 12/20/2015 1117   GFRNONAA 50 (L) 12/20/2015 1117   GFRAA 58 (L) 12/20/2015 1117    Assessment and Plan :  1. Type 2 diabetes mellitus with other circulatory complication (HCC) 123456 was 6.5 or above several times in the past few years. Patient to continue to work on diet and exercise habits to control this. - POCT HgB A1C 5.9 today. Follow up in 4 months. - POCT UA - Microalbumin  2. HYPERTENSION, BENIGN  3. Insomnia Tried and failed melatonin. Try Trazodone. Follow up  in 4 months.  - traZODone (DESYREL) 50 MG tablet; Take 1 tablet (50 mg total) by mouth at bedtime.  Dispense: 30 tablet; Refill: 12 4. CAD/status post CABG in 2000  All risk factors treated. 5. Atrial fibrillation Chronic anticoagulation. Followed by Dr. [name].  Patient was seen and examined by Dr. Miguel Aschoff, and noted scribed by Webb Laws, [place] MD [facility] 03/21/2016 4:14 PM

## 2016-03-22 ENCOUNTER — Other Ambulatory Visit: Payer: Self-pay

## 2016-03-22 MED ORDER — CLONIDINE HCL 0.2 MG PO TABS: ORAL_TABLET | ORAL | 3 refills | Status: DC

## 2016-03-29 ENCOUNTER — Ambulatory Visit (INDEPENDENT_AMBULATORY_CARE_PROVIDER_SITE_OTHER): Payer: PPO

## 2016-03-29 DIAGNOSIS — N183 Chronic kidney disease, stage 3 (moderate): Secondary | ICD-10-CM | POA: Diagnosis not present

## 2016-03-29 DIAGNOSIS — D631 Anemia in chronic kidney disease: Secondary | ICD-10-CM | POA: Diagnosis not present

## 2016-03-29 DIAGNOSIS — Z7901 Long term (current) use of anticoagulants: Secondary | ICD-10-CM | POA: Diagnosis not present

## 2016-03-29 DIAGNOSIS — I129 Hypertensive chronic kidney disease with stage 1 through stage 4 chronic kidney disease, or unspecified chronic kidney disease: Secondary | ICD-10-CM | POA: Diagnosis not present

## 2016-03-29 DIAGNOSIS — I4891 Unspecified atrial fibrillation: Secondary | ICD-10-CM

## 2016-03-29 DIAGNOSIS — N2581 Secondary hyperparathyroidism of renal origin: Secondary | ICD-10-CM | POA: Diagnosis not present

## 2016-03-29 LAB — POCT INR: INR: 1.8

## 2016-04-04 DIAGNOSIS — N183 Chronic kidney disease, stage 3 (moderate): Secondary | ICD-10-CM | POA: Diagnosis not present

## 2016-04-04 DIAGNOSIS — R809 Proteinuria, unspecified: Secondary | ICD-10-CM | POA: Diagnosis not present

## 2016-04-04 DIAGNOSIS — I129 Hypertensive chronic kidney disease with stage 1 through stage 4 chronic kidney disease, or unspecified chronic kidney disease: Secondary | ICD-10-CM | POA: Diagnosis not present

## 2016-04-04 DIAGNOSIS — N2581 Secondary hyperparathyroidism of renal origin: Secondary | ICD-10-CM | POA: Diagnosis not present

## 2016-04-26 ENCOUNTER — Ambulatory Visit (INDEPENDENT_AMBULATORY_CARE_PROVIDER_SITE_OTHER): Payer: PPO | Admitting: *Deleted

## 2016-04-26 DIAGNOSIS — I4891 Unspecified atrial fibrillation: Secondary | ICD-10-CM

## 2016-04-26 DIAGNOSIS — Z7901 Long term (current) use of anticoagulants: Secondary | ICD-10-CM | POA: Diagnosis not present

## 2016-04-26 LAB — POCT INR: INR: 2.6

## 2016-05-10 ENCOUNTER — Other Ambulatory Visit: Payer: Self-pay | Admitting: Cardiovascular Disease

## 2016-05-11 ENCOUNTER — Ambulatory Visit (INDEPENDENT_AMBULATORY_CARE_PROVIDER_SITE_OTHER): Payer: PPO

## 2016-05-11 DIAGNOSIS — Z23 Encounter for immunization: Secondary | ICD-10-CM

## 2016-05-24 ENCOUNTER — Ambulatory Visit (INDEPENDENT_AMBULATORY_CARE_PROVIDER_SITE_OTHER): Payer: PPO | Admitting: *Deleted

## 2016-05-24 DIAGNOSIS — Z7901 Long term (current) use of anticoagulants: Secondary | ICD-10-CM | POA: Diagnosis not present

## 2016-05-24 DIAGNOSIS — I4891 Unspecified atrial fibrillation: Secondary | ICD-10-CM | POA: Diagnosis not present

## 2016-05-24 LAB — POCT INR: INR: 2.5

## 2016-05-30 DIAGNOSIS — D485 Neoplasm of uncertain behavior of skin: Secondary | ICD-10-CM | POA: Diagnosis not present

## 2016-05-30 DIAGNOSIS — L570 Actinic keratosis: Secondary | ICD-10-CM | POA: Diagnosis not present

## 2016-05-30 DIAGNOSIS — Z08 Encounter for follow-up examination after completed treatment for malignant neoplasm: Secondary | ICD-10-CM | POA: Diagnosis not present

## 2016-05-30 DIAGNOSIS — C44222 Squamous cell carcinoma of skin of right ear and external auricular canal: Secondary | ICD-10-CM | POA: Diagnosis not present

## 2016-05-30 DIAGNOSIS — Z872 Personal history of diseases of the skin and subcutaneous tissue: Secondary | ICD-10-CM | POA: Diagnosis not present

## 2016-05-30 DIAGNOSIS — X32XXXA Exposure to sunlight, initial encounter: Secondary | ICD-10-CM | POA: Diagnosis not present

## 2016-05-30 DIAGNOSIS — Z85820 Personal history of malignant melanoma of skin: Secondary | ICD-10-CM | POA: Diagnosis not present

## 2016-05-30 DIAGNOSIS — Z85828 Personal history of other malignant neoplasm of skin: Secondary | ICD-10-CM | POA: Diagnosis not present

## 2016-06-02 ENCOUNTER — Other Ambulatory Visit: Payer: Self-pay | Admitting: Cardiovascular Disease

## 2016-06-05 ENCOUNTER — Telehealth: Payer: Self-pay | Admitting: Cardiovascular Disease

## 2016-06-05 DIAGNOSIS — R0602 Shortness of breath: Secondary | ICD-10-CM

## 2016-06-05 NOTE — Telephone Encounter (Signed)
For shortness of breath, we need echocardiogram to estimate right heart pressures, ejection fraction Last one was done over 2 years ago For now could try torsemide 20 mg twice a day if leg swelling suggests fluid retention

## 2016-06-05 NOTE — Telephone Encounter (Signed)
Pt calling stating he is having some SOB more than normal   Pt c/o Shortness Of Breath: STAT if SOB developed within the last 24 hours or pt is noticeably SOB on the phone  1. Are you currently SOB (can you hear that pt is SOB on the phone)? no  2. How long have you been experiencing SOB? Last three weeks it has gotten worst.   3. Are you SOB when sitting or when up moving around?  When moving around, any time he moves and even when laying down.  4. Are you currently experiencing any other symptoms? No  Has a trip planned for the week of thanksgiving and would like to know if he is okay to do this  Please advise.

## 2016-06-05 NOTE — Telephone Encounter (Signed)
Spoke w/ pt.  He reports significantly increased SOB. Previously c/o SOBOE, but now reports that SOB is constant, seems to be worse at night. Pt does perform daily wts, has lost 15 lbs since last ov 01/31/16. Takes torsemide 20 mg once daily, follows low Na diet and limits his fluids. He was hoping that his sx would improve if he lost wt, but it seems that they have worsened. He is planning on travelling in a few weeks and wants to make sure he is ok. He would like to some advise over the phone, but would be happy to come in if needed. Advised that I will make Dr. [name] aware and call him w/ his recommendation.

## 2016-06-06 ENCOUNTER — Other Ambulatory Visit: Payer: Self-pay | Admitting: Cardiovascular Disease

## 2016-06-06 NOTE — Telephone Encounter (Signed)
Please review for refill. Thanks!  

## 2016-06-06 NOTE — Telephone Encounter (Signed)
Spoke w/ pt.  Advised him of Dr. Donivan Scull recommendation. He does not feel that his SOB is r/t fluid, so he will not increase his torsemide. Call transferred to scheduling to set up ECHO.

## 2016-06-14 ENCOUNTER — Other Ambulatory Visit: Payer: Self-pay

## 2016-06-14 ENCOUNTER — Ambulatory Visit (INDEPENDENT_AMBULATORY_CARE_PROVIDER_SITE_OTHER): Payer: PPO

## 2016-06-14 VITALS — BP 150/90 | HR 57

## 2016-06-14 DIAGNOSIS — R0602 Shortness of breath: Secondary | ICD-10-CM | POA: Diagnosis not present

## 2016-06-14 DIAGNOSIS — I4510 Unspecified right bundle-branch block: Secondary | ICD-10-CM | POA: Diagnosis not present

## 2016-06-15 ENCOUNTER — Telehealth: Payer: Self-pay | Admitting: Cardiovascular Disease

## 2016-06-15 NOTE — Telephone Encounter (Signed)
Patient called because he was told someone would call him today. He was here yesterday having an echocardiogram and during echo the technician noticed a low heart rate. We completed a EKG and blood pressure while he was here to evaluate that. Patients blood pressure yesterday was 150/90 and heart rate on EKG was 57. Dr. Yvone Neu reviewed this information and also discussed with Dr. [name]. Reviewed this information with the patient and let him know that Dr. [name] would see him at his upcoming appointment on 07/11/16. Instructed him to call if he should have any problems prior to that appointment. He verbalized understanding of our conversation and had no further questions at this time.

## 2016-06-21 ENCOUNTER — Ambulatory Visit (INDEPENDENT_AMBULATORY_CARE_PROVIDER_SITE_OTHER): Payer: PPO

## 2016-06-21 DIAGNOSIS — I4891 Unspecified atrial fibrillation: Secondary | ICD-10-CM

## 2016-06-21 DIAGNOSIS — Z7901 Long term (current) use of anticoagulants: Secondary | ICD-10-CM | POA: Diagnosis not present

## 2016-06-21 LAB — POCT INR: INR: 2.9

## 2016-07-04 DIAGNOSIS — C44222 Squamous cell carcinoma of skin of right ear and external auricular canal: Secondary | ICD-10-CM | POA: Diagnosis not present

## 2016-07-11 ENCOUNTER — Ambulatory Visit (INDEPENDENT_AMBULATORY_CARE_PROVIDER_SITE_OTHER): Payer: PPO | Admitting: Cardiovascular Disease

## 2016-07-11 ENCOUNTER — Encounter: Payer: Self-pay | Admitting: Cardiovascular Disease

## 2016-07-11 VITALS — BP 170/70 | HR 61 | Ht 66.0 in | Wt 186.2 lb

## 2016-07-11 DIAGNOSIS — E782 Mixed hyperlipidemia: Secondary | ICD-10-CM

## 2016-07-11 DIAGNOSIS — I2510 Atherosclerotic heart disease of native coronary artery without angina pectoris: Secondary | ICD-10-CM

## 2016-07-11 DIAGNOSIS — I4891 Unspecified atrial fibrillation: Secondary | ICD-10-CM | POA: Diagnosis not present

## 2016-07-11 DIAGNOSIS — I5032 Chronic diastolic (congestive) heart failure: Secondary | ICD-10-CM | POA: Diagnosis not present

## 2016-07-11 DIAGNOSIS — I10 Essential (primary) hypertension: Secondary | ICD-10-CM | POA: Diagnosis not present

## 2016-07-11 MED ORDER — ISOSORBIDE MONONITRATE ER 30 MG PO TB24: 30.0000 mg | ORAL_TABLET | Freq: Every day | ORAL | 11 refills | Status: DC

## 2016-07-11 NOTE — Progress Notes (Signed)
Cardiology Office Note  Date:  07/11/2016   ID:  [name], [name] [birth year], MRN [id number]  PCP:  Aaron Durie, MD   Chief Complaint  Patient presents with  . other    Afib. Meds reviewed verbally with pt.    HPI:  [name] is a 82 y.o. male w/ PMHx s/f CAD s/p CABG 17 years ago, permanent atrial fibrillation, chronic Coumadin anticoagulation, chronic RBBB, HTN and chronic LE edema. He presents for routine followup of his coronary artery disease He has a single kidney.    taking torsemide 20 mg BID Breathing better, leg swelling better Trace edema, Walking better  Leg pain goine Drinking more than normal past 5 days on abx  Naked weight at home 175, Best is 173   BP elevated above 160 at home  EKG on today's visit shows atrial fibrillation with ventricular rate 61 bpm, right bundle branch block, nonspecific ST and T wave abnormality. No change to his EKG  Other past medical history Prior lab work, Hemoglobin A1c 6.2, total cholesterol in 2014 was 129 seen by Dr. Abigail Gross, renal service  Seen in clinic February 2015 at which time he had significant leg edema. Amlodipine was held Echocardiogram around that time showed normal LV systolic function, moderate pulmonary hypertension with pleural effusion on the left Also had severe hypertension.   PMH:   has a past medical history of Atrial fibrillation ([place]); Coronary artery disease; Edema; Hypertension; RBBB (right bundle branch block); and Squamous cell carcinoma.  PSH:    Past Surgical History:  Procedure Laterality Date  . APPENDECTOMY    . CARDIOVERSION  09/21/08  . CORONARY ARTERY BYPASS GRAFT    . HERNIA REPAIR    . MELANOMA EXCISION     back  . NEPHRECTOMY    . PROSTATE ABLATION     transurethral needle ablation  . SQUAMOUS CELL CARCINOMA EXCISION     left hand & right ear.     Current Outpatient Prescriptions  Medication Sig Dispense Refill  . Ascorbic Acid (VITAMIN C) 500 MG tablet  Take 500 mg by mouth daily.      Marland Kitchen aspirin 81 MG tablet Take 81 mg by mouth daily.      . cloNIDine (CATAPRES) 0.2 MG tablet TAKE 1 TABLET (0.2 MG TOTAL) BY MOUTH 2 (TWO) TIMES DAILY. 180 tablet 3  . glucosamine-chondroitin 500-400 MG tablet Take 1 tablet by mouth daily.      Marland Kitchen glucose blood test strip Test once daily DX E11.9 50 each 12  . loratadine (CLARITIN) 10 MG tablet TAKE 1 TABLET BY MOUTH DAILY AS NEEDED 30 tablet 12  . metoprolol succinate (TOPROL-XL) 100 MG 24 hr tablet TAKE 1 TABLET (100 MG TOTAL) BY MOUTH DAILY. 30 tablet 3  . Multiple Vitamin (MULTIVITAMIN) tablet Take 1 tablet by mouth daily.      . quinapril (ACCUPRIL) 40 MG tablet TAKE 1 TABLET (40 MG TOTAL) BY MOUTH 2 (TWO) TIMES DAILY. 180 tablet 3  . simvastatin (ZOCOR) 10 MG tablet TAKE ONE TABLET BY MOUTH DAILY AT BEDTIME 90 tablet 3  . Tamsulosin HCl (FLOMAX) 0.4 MG CAPS Take 0.4 mg by mouth daily.      Marland Kitchen torsemide (DEMADEX) 20 MG tablet TAKE 1 TABLET BY MOUTH 2  TIMES DAILY AS NEEDED. 60 tablet 2  . traZODone (DESYREL) 50 MG tablet Take 1 tablet (50 mg total) by mouth at bedtime. 30 tablet 12  . warfarin (COUMADIN) 5 MG tablet TAKE AS  DIRECTED BY COUMADIN CLINIC 60 tablet 0   No current facility-administered medications for this visit.      Allergies:   Pollen extract   Social History:  The patient  reports that he has quit smoking. He has never used smokeless tobacco. He reports that he drinks about 2.4 - 4.8 oz of alcohol per week . He reports that he does not use drugs.   Family History:   family history includes Arthritis in his mother; Heart attack in his father.    Review of Systems: Review of Systems  Constitutional: Positive for weight loss.  Respiratory: Positive for shortness of breath.   Cardiovascular: Positive for leg swelling.  Gastrointestinal: Negative.   Musculoskeletal: Negative.   Neurological: Negative.   Psychiatric/Behavioral: Negative.   All other systems reviewed and are  negative.    PHYSICAL EXAM: VS:  BP (!) 170/70 (BP Location: Left Arm, Patient Position: Sitting, Cuff Size: Normal)   Pulse 61   Ht 5\' 6"  (1.676 m)   Wt 186 lb 4 oz (84.5 kg)   BMI 30.06 kg/m  , BMI Body mass index is 30.06 kg/m. GEN: Well nourished, well developed, in no acute distress, obese  HEENT: normal  Neck: no JVD, carotid bruits, or masses Cardiac: RRR; no murmurs, rubs, or gallops, trace LE pitting edema  Respiratory:  clear to auscultation bilaterally, normal work of breathing GI: soft, nontender, nondistended, + BS MS: no deformity or atrophy  Skin: warm and dry, no rash Neuro:  Strength and sensation are intact Psych: euthymic mood, full affect    Recent Labs: 08/04/2015: TSH 2.290 12/20/2015: ALT 21; BUN 21; Creatinine, Ser 1.29; Potassium 4.2; Sodium 142    Lipid Panel Lab Results  Component Value Date   CHOL 116 08/04/2015   HDL 52 08/04/2015   LDLCALC 51 08/04/2015   TRIG 66 08/04/2015      Wt Readings from Last 3 Encounters:  07/11/16 186 lb 4 oz (84.5 kg)  03/21/16 192 lb (87.1 kg)  01/31/16 192 lb (87.1 kg)       ASSESSMENT AND PLAN:  Atrial fibrillation, unspecified type ([place]) - Plan: EKG 12-Lead Chronic, rate controlled, on warfarin  HYPERTENSION, BENIGN We will add imdur 30 mg daily Stay on other meds, Monitor BP at home  Atherosclerosis of native coronary artery of native heart without angina pectoris Currently with no symptoms of angina. No further workup at this time. Continue current medication regimen.  Chronic diastolic CHF (congestive heart failure) (HCC) Down 6 poubnds Goal weight at home 172 pounds  Mixed hyperlipidemia Cholesterol is at goal on the current lipid regimen. No changes to the medications were made.   Total encounter time more than 25 minutes  Greater than 50% was spent in counseling and coordination of care with the patient  Disposition:   F/U  3 months   Orders Placed This Encounter  Procedures   . EKG 12-Lead     Signed, [name], M.D., Ph.D. 07/11/2016  [place], [place]

## 2016-07-11 NOTE — Patient Instructions (Signed)
Medication Instructions:   Stay on torsemide twice a day Goal weight is 172 pounds  Labwork:  No new labs needed  Testing/Procedures:  No further testing at this time   I recommend watching educational videos on topics of interest to you at:       [email or web address]  Enter code: [email or web address]    Follow-Up: It was a pleasure seeing you in the office today. Please call us if you have new issues that need to be addressed before your next appt.  [phone]  Your physician wants you to follow-up in: 4 months.  You will receive a reminder letter in the mail two months in advance. If you don't receive a letter, please call our office to schedule the follow-up appointment.  If you need a refill on your cardiac medications before your next appointment, please call your pharmacy.

## 2016-07-17 ENCOUNTER — Ambulatory Visit (INDEPENDENT_AMBULATORY_CARE_PROVIDER_SITE_OTHER): Payer: PPO | Admitting: Family Medicine

## 2016-07-17 VITALS — BP 142/78 | HR 58 | Temp 97.8°F | Resp 16 | Wt 186.0 lb

## 2016-07-17 DIAGNOSIS — G4709 Other insomnia: Secondary | ICD-10-CM

## 2016-07-17 DIAGNOSIS — I10 Essential (primary) hypertension: Secondary | ICD-10-CM | POA: Diagnosis not present

## 2016-07-17 DIAGNOSIS — E1159 Type 2 diabetes mellitus with other circulatory complications: Secondary | ICD-10-CM

## 2016-07-17 DIAGNOSIS — E782 Mixed hyperlipidemia: Secondary | ICD-10-CM | POA: Diagnosis not present

## 2016-07-17 MED ORDER — TRAZODONE HCL 100 MG PO TABS: 100.0000 mg | ORAL_TABLET | Freq: Every day | ORAL | 3 refills | Status: DC

## 2016-07-17 NOTE — Progress Notes (Signed)
[name]  MRN: [id number] DOB: 12/18/1927  Subjective:  HPI   The patient is an 82 year old male who presents for follow up of his diabetes and insomnia.  The patient was last seen on 03/21/16.  His A1C on that day was 5.9.  He checks his glucose at home and his readings have averaged a ranged from 110's-130's.  No symptoms of hypoglycemia.  He states that  He has on occasion had a reading drop into the 90's.  He reports no symptoms with this drop.   The patient is also here to discuss his insomnia.  The patient was started on Trazodone 50 mg on his last visit.  He said that the medicine helped for about 2 nights but he has not really seen any imorovement with his sleep.  He states that once he falls asleep he will sleep for about 3-5 hours and then wake up.  Once he wakes up he is unable to go back to sleep. The patient is a also due to have his labs done today as it has been about a year since he has had them checked.  Patient Active Problem List   Diagnosis Date Noted  . Actinic keratoses 12/10/2014  . Allergic rhinitis 12/10/2014  . A-fib ([place]) 12/10/2014  . CCF (congestive cardiac failure) ([place]) 12/10/2014  . Acid reflux 12/10/2014  . HLD (hyperlipidemia) 12/10/2014  . BP (high blood pressure) 12/10/2014  . Arthritis, degenerative 12/10/2014  . Diabetes mellitus, type 2 ([place]) 12/10/2014  . Chronic renal insufficiency 03/05/2014  . Status post nephrectomy 03/05/2014  . Chronic diastolic CHF (congestive heart failure) ([place]) 10/13/2013  . Pre-op evaluation 09/17/2013  . Permanent atrial fibrillation ([place]) 09/17/2013  . Long term current use of anticoagulant 10/25/2010  . Shortness of breath 11/01/2009  . CAD, NATIVE VESSEL 10/22/2009  . HYPERTENSION, BENIGN 03/09/2009  . RBBB 12/29/2008  . EDEMA 12/29/2008    Past Medical History:  Diagnosis Date  . Atrial fibrillation (HCC)    coumadin therapy  . Coronary artery disease    native vessel  . Edema   . Hypertension     . RBBB (right bundle branch block)   . Squamous cell carcinoma    left hand & right ear     Social History   Social History  . Marital status: Married    Spouse name: N/A  . Number of children: 3  . Years of education: N/A   Occupational History  . 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 Smoking status: Former Research scientist (life sciences)  . Smokeless tobacco: Never Used  . Alcohol use 2.4 - 4.8 oz/week    4 - 8 Standard drinks or equivalent per week  . Drug use: No  . Sexual activity: Not on file   Other Topics Concern  . Not on file   Social History Narrative   He lives with his wife.          Outpatient Encounter Prescriptions as of 07/17/2016  Medication Sig Note  . Ascorbic Acid (VITAMIN C) 500 MG tablet Take 500 mg by mouth daily.     Marland Kitchen aspirin 81 MG tablet Take 81 mg by mouth daily.     . cloNIDine (CATAPRES) 0.2 MG tablet TAKE 1 TABLET (0.2 MG TOTAL) BY MOUTH 2 (TWO) TIMES DAILY.   Marland Kitchen glucosamine-chondroitin 500-400 MG tablet Take 1 tablet by mouth daily.     Marland Kitchen glucose blood test strip Test once daily  DX E11.9   . isosorbide mononitrate (IMDUR) 30 MG 24 hr tablet Take 1 tablet (30 mg total) by mouth daily.   Marland Kitchen loratadine (CLARITIN) 10 MG tablet TAKE 1 TABLET BY MOUTH DAILY AS NEEDED   . metoprolol succinate (TOPROL-XL) 100 MG 24 hr tablet TAKE 1 TABLET (100 MG TOTAL) BY MOUTH DAILY.   . Multiple Vitamin (MULTIVITAMIN) tablet Take 1 tablet by mouth daily.     . quinapril (ACCUPRIL) 40 MG tablet TAKE 1 TABLET (40 MG TOTAL) BY MOUTH 2 (TWO) TIMES DAILY.   . simvastatin (ZOCOR) 10 MG tablet TAKE ONE TABLET BY MOUTH DAILY AT BEDTIME   . Tamsulosin HCl (FLOMAX) 0.4 MG CAPS Take 0.4 mg by mouth daily.     Marland Kitchen torsemide (DEMADEX) 20 MG tablet TAKE 1 TABLET BY MOUTH 2  TIMES DAILY AS NEEDED.   Marland Kitchen traZODone (DESYREL) 50 MG tablet Take 1 tablet (50 mg total) by mouth at bedtime. 07/11/2016: PRN  . warfarin (COUMADIN) 5 MG tablet TAKE AS DIRECTED BY COUMADIN CLINIC     No facility-administered encounter medications on file as of 07/17/2016.     Allergies  Allergen Reactions  . Pollen Extract     Review of Systems  Constitutional: Negative for fever and malaise/fatigue.  Eyes: Negative.   Respiratory: Negative for cough, shortness of breath and wheezing.   Cardiovascular: Positive for palpitations. Negative for chest pain, orthopnea, claudication, leg swelling and PND.       DOE-the patient had an episode of this about a month ago and was seen and treated by cardiology.  He is currently on increased diuretics and is doing better.  He also had edema up to the knee, more on right than the left.  He states that has all resolved also.  He does have follow up with cardiology in a few months.  Gastrointestinal: Negative.   Neurological: Negative for dizziness, weakness and headaches.  Endo/Heme/Allergies: Negative.   Psychiatric/Behavioral: Negative.     Objective:  BP (!) 142/78 (BP Location: Right Arm, Patient Position: Sitting, Cuff Size: Normal)   Pulse (!) 58   Temp 97.8 F (36.6 C) (Oral)   Resp 16   Wt 186 lb (84.4 kg)   BMI 30.02 kg/m   Physical Exam  Constitutional: He is oriented to person, place, and time and well-developed, well-nourished, and in no distress.  HENT:  Head: Normocephalic and atraumatic.  Right Ear: External ear normal.  Left Ear: External ear normal.  Nose: Nose normal.  Eyes: Conjunctivae are normal. Pupils are equal, round, and reactive to light. No scleral icterus.  Neck: No thyromegaly present.  Cardiovascular: Normal rate, regular rhythm and normal heart sounds.   Pulmonary/Chest: Effort normal and breath sounds normal.  Abdominal: Soft.  Neurological: He is alert and oriented to person, place, and time.  Skin: Skin is warm and dry.  Psychiatric: Mood, memory, affect and judgment normal.    Assessment and Plan :  1. HYPERTENSION, BENIGN  - CBC with Differential/Platelet - Comprehensive metabolic  panel - TSH  2. Type 2 diabetes mellitus with other circulatory complication, unspecified long term insulin use status (HCC)  - Hemoglobin A1c  3. Mixed hyperlipidemia  - Lipid Panel With LDL/HDL Ratio  4. Other insomnia  - traZODone (DESYREL) 100 MG tablet; Take 1 tablet (100 mg total) by mouth at bedtime.  Dispense: 90 tablet; Refill: 3 .5.CAD All risk factors treated.  I have done the exam and reviewed the chart and it is accurate to  the best of my knowledge. Development worker, community has been used and  any errors in dictation or transcription are unintentional. Miguel Aschoff M.D. [facility]

## 2016-07-18 DIAGNOSIS — E1159 Type 2 diabetes mellitus with other circulatory complications: Secondary | ICD-10-CM | POA: Diagnosis not present

## 2016-07-18 DIAGNOSIS — I10 Essential (primary) hypertension: Secondary | ICD-10-CM | POA: Diagnosis not present

## 2016-07-18 DIAGNOSIS — E782 Mixed hyperlipidemia: Secondary | ICD-10-CM | POA: Diagnosis not present

## 2016-07-19 LAB — COMPREHENSIVE METABOLIC PANEL
ALBUMIN: 4 g/dL (ref 3.5–4.7)
ALT: 28 IU/L (ref 0–44)
AST: 28 IU/L (ref 0–40)
Albumin/Globulin Ratio: 1.9 (ref 1.2–2.2)
Alkaline Phosphatase: 83 IU/L (ref 39–117)
BILIRUBIN TOTAL: 0.6 mg/dL (ref 0.0–1.2)
BUN / CREAT RATIO: 21 (ref 10–24)
BUN: 30 mg/dL — AB (ref 8–27)
CO2: 27 mmol/L (ref 18–29)
CREATININE: 1.42 mg/dL — AB (ref 0.76–1.27)
Calcium: 8.8 mg/dL (ref 8.6–10.2)
Chloride: 101 mmol/L (ref 96–106)
GFR calc non Af Amer: 45 mL/min/{1.73_m2} — ABNORMAL LOW (ref >59)
GFR, EST AFRICAN AMERICAN: 52 mL/min/{1.73_m2} — AB (ref >59)
GLUCOSE: 109 mg/dL — AB (ref 65–99)
Globulin, Total: 2.1 g/dL (ref 1.5–4.5)
Potassium: 4.2 mmol/L (ref 3.5–5.2)
Sodium: 146 mmol/L — ABNORMAL HIGH (ref 134–144)
TOTAL PROTEIN: 6.1 g/dL (ref 6.0–8.5)

## 2016-07-19 LAB — CBC WITH DIFFERENTIAL/PLATELET
BASOS ABS: 0 10*3/uL (ref 0.0–0.2)
BASOS: 1 %
EOS (ABSOLUTE): 0.1 10*3/uL (ref 0.0–0.4)
EOS: 3 %
HEMATOCRIT: 40.6 % (ref 37.5–51.0)
HEMOGLOBIN: 14.2 g/dL (ref 13.0–17.7)
IMMATURE GRANS (ABS): 0 10*3/uL (ref 0.0–0.1)
Immature Granulocytes: 0 %
LYMPHS: 16 %
Lymphocytes Absolute: 0.8 10*3/uL (ref 0.7–3.1)
MCH: 30.3 pg (ref 26.6–33.0)
MCHC: 35 g/dL (ref 31.5–35.7)
MCV: 87 fL (ref 79–97)
MONOCYTES: 12 %
Monocytes Absolute: 0.6 10*3/uL (ref 0.1–0.9)
NEUTROS ABS: 3.5 10*3/uL (ref 1.4–7.0)
Neutrophils: 68 %
Platelets: 146 10*3/uL — ABNORMAL LOW (ref 150–379)
RBC: 4.68 x10E6/uL (ref 4.14–5.80)
RDW: 13.6 % (ref 12.3–15.4)
WBC: 5.1 10*3/uL (ref 3.4–10.8)

## 2016-07-19 LAB — LIPID PANEL WITH LDL/HDL RATIO
Cholesterol, Total: 127 mg/dL (ref 100–199)
HDL: 56 mg/dL (ref >39)
LDL CALC: 54 mg/dL (ref 0–99)
LDL/HDL RATIO: 1 ratio (ref 0.0–3.6)
TRIGLYCERIDES: 87 mg/dL (ref 0–149)
VLDL Cholesterol Cal: 17 mg/dL (ref 5–40)

## 2016-07-19 LAB — TSH: TSH: 3.13 u[IU]/mL (ref 0.450–4.500)

## 2016-07-19 LAB — HEMOGLOBIN A1C
Est. average glucose Bld gHb Est-mCnc: 128 mg/dL
Hgb A1c MFr Bld: 6.1 % — ABNORMAL HIGH (ref 4.8–5.6)

## 2016-07-26 ENCOUNTER — Ambulatory Visit (INDEPENDENT_AMBULATORY_CARE_PROVIDER_SITE_OTHER): Payer: PPO

## 2016-07-26 DIAGNOSIS — I4891 Unspecified atrial fibrillation: Secondary | ICD-10-CM

## 2016-07-26 DIAGNOSIS — Z7901 Long term (current) use of anticoagulants: Secondary | ICD-10-CM

## 2016-07-26 LAB — POCT INR: INR: 2.7

## 2016-08-15 DIAGNOSIS — I129 Hypertensive chronic kidney disease with stage 1 through stage 4 chronic kidney disease, or unspecified chronic kidney disease: Secondary | ICD-10-CM | POA: Diagnosis not present

## 2016-08-15 DIAGNOSIS — R601 Generalized edema: Secondary | ICD-10-CM | POA: Diagnosis not present

## 2016-08-15 DIAGNOSIS — N183 Chronic kidney disease, stage 3 (moderate): Secondary | ICD-10-CM | POA: Diagnosis not present

## 2016-08-15 DIAGNOSIS — R809 Proteinuria, unspecified: Secondary | ICD-10-CM | POA: Diagnosis not present

## 2016-08-29 ENCOUNTER — Other Ambulatory Visit: Payer: Self-pay | Admitting: Cardiovascular Disease

## 2016-09-06 ENCOUNTER — Telehealth: Payer: Self-pay | Admitting: Cardiovascular Disease

## 2016-09-06 ENCOUNTER — Ambulatory Visit (INDEPENDENT_AMBULATORY_CARE_PROVIDER_SITE_OTHER): Payer: PPO

## 2016-09-06 DIAGNOSIS — Z7901 Long term (current) use of anticoagulants: Secondary | ICD-10-CM | POA: Diagnosis not present

## 2016-09-06 DIAGNOSIS — I4891 Unspecified atrial fibrillation: Secondary | ICD-10-CM | POA: Diagnosis not present

## 2016-09-06 LAB — POCT INR: INR: 3.2

## 2016-09-06 NOTE — Telephone Encounter (Signed)
kolloru started amlodipine Would be careful, this was heldin 2015 for significant leg swelling, side effect What is his weight at home should be 172 lbs If higher, could have fluid, may need extra torsemide for SOB

## 2016-09-06 NOTE — Telephone Encounter (Signed)
Patient came in today for a coumadin check and had some concerns. He states that he saw Dr. [name] back in December and was having some shortness of breath and elevated blood pressures at that time. He then saw Dr. Abigail Butts in January and that his blood pressures were still elevated and he was then started on amlodipine.   He states that the past week to 10 days with any exertion he is just gasping for breath. Last week he went on a trip and while unloading the car he was really gasping. He states that it feels different from previous shortness of breath. He stated that it is more a "winded" type of breathing. He is taking the torsemide twice a day and he monitors his blood pressures daily and they have been good and states they are in the 130's and 60-70's. He states that he has taking the amlodipine with no problems before so he doesn't think that is the problem.   I asked him if he does any regular exercise, and instructed him to try some regular low impact exercising to build up tolerance as this could be due to deconditioning. He felt this may be the issue and will try to do some walking. He does not have any other complaints at this time. He just wanted to know if he should be concerned or see Dr. [name]. Let him know to call back if symptoms persist or if they should worsen and we can schedule him to come in if needed. He verbalized understanding of our conversation, agreement with the plan, and had no further questions at this time. Will make Dr. [name] aware of our conversation.

## 2016-09-07 ENCOUNTER — Other Ambulatory Visit: Payer: Self-pay | Admitting: Cardiovascular Disease

## 2016-09-07 NOTE — Telephone Encounter (Signed)
Called patient to see how he was doing today and he states that he went walking yesterday and his breathing got better. He really felt that this helped although he was a little sore today. I did ask his current weight and he reported 173 pounds. He feels that the walking was really helpful. Instructed him to call back if symptoms should worsen and continue to monitor his daily weights. He verbalized understanding and had no further questions at this time.

## 2016-09-20 DIAGNOSIS — E113293 Type 2 diabetes mellitus with mild nonproliferative diabetic retinopathy without macular edema, bilateral: Secondary | ICD-10-CM | POA: Diagnosis not present

## 2016-09-20 DIAGNOSIS — H2511 Age-related nuclear cataract, right eye: Secondary | ICD-10-CM | POA: Diagnosis not present

## 2016-09-20 LAB — HM DIABETES EYE EXAM

## 2016-09-25 ENCOUNTER — Other Ambulatory Visit: Payer: Self-pay | Admitting: Cardiovascular Disease

## 2016-09-26 ENCOUNTER — Other Ambulatory Visit: Payer: Self-pay | Admitting: Cardiovascular Disease

## 2016-09-26 ENCOUNTER — Encounter: Payer: Self-pay | Admitting: Emergency Medicine

## 2016-09-27 NOTE — Telephone Encounter (Signed)
Please review for refill. Thanks!  

## 2016-09-29 ENCOUNTER — Telehealth: Payer: Self-pay | Admitting: Family Medicine

## 2016-09-29 NOTE — Telephone Encounter (Signed)
Called Pt to schedule AWV with [facility] - knb °

## 2016-10-03 NOTE — Telephone Encounter (Signed)
Pt called back.  He said he does not want to schedule AWV.  Con [place]

## 2016-10-04 ENCOUNTER — Ambulatory Visit (INDEPENDENT_AMBULATORY_CARE_PROVIDER_SITE_OTHER): Payer: PPO

## 2016-10-04 DIAGNOSIS — Z7901 Long term (current) use of anticoagulants: Secondary | ICD-10-CM | POA: Diagnosis not present

## 2016-10-04 DIAGNOSIS — I4891 Unspecified atrial fibrillation: Secondary | ICD-10-CM

## 2016-10-04 LAB — POCT INR: INR: 3.6

## 2016-10-10 ENCOUNTER — Encounter: Payer: Self-pay | Admitting: Family Medicine

## 2016-10-12 DIAGNOSIS — I10 Essential (primary) hypertension: Secondary | ICD-10-CM | POA: Diagnosis not present

## 2016-10-12 DIAGNOSIS — D631 Anemia in chronic kidney disease: Secondary | ICD-10-CM | POA: Diagnosis not present

## 2016-10-12 DIAGNOSIS — E875 Hyperkalemia: Secondary | ICD-10-CM | POA: Diagnosis not present

## 2016-10-12 DIAGNOSIS — N2581 Secondary hyperparathyroidism of renal origin: Secondary | ICD-10-CM | POA: Diagnosis not present

## 2016-10-12 DIAGNOSIS — I129 Hypertensive chronic kidney disease with stage 1 through stage 4 chronic kidney disease, or unspecified chronic kidney disease: Secondary | ICD-10-CM | POA: Diagnosis not present

## 2016-10-12 DIAGNOSIS — N183 Chronic kidney disease, stage 3 (moderate): Secondary | ICD-10-CM | POA: Diagnosis not present

## 2016-10-12 DIAGNOSIS — I509 Heart failure, unspecified: Secondary | ICD-10-CM | POA: Diagnosis not present

## 2016-10-17 ENCOUNTER — Other Ambulatory Visit: Payer: Self-pay | Admitting: Cardiovascular Disease

## 2016-10-17 DIAGNOSIS — N183 Chronic kidney disease, stage 3 (moderate): Secondary | ICD-10-CM | POA: Diagnosis not present

## 2016-10-17 DIAGNOSIS — I129 Hypertensive chronic kidney disease with stage 1 through stage 4 chronic kidney disease, or unspecified chronic kidney disease: Secondary | ICD-10-CM | POA: Diagnosis not present

## 2016-10-17 DIAGNOSIS — R809 Proteinuria, unspecified: Secondary | ICD-10-CM | POA: Diagnosis not present

## 2016-10-17 DIAGNOSIS — N2581 Secondary hyperparathyroidism of renal origin: Secondary | ICD-10-CM | POA: Diagnosis not present

## 2016-10-17 DIAGNOSIS — E875 Hyperkalemia: Secondary | ICD-10-CM | POA: Diagnosis not present

## 2016-10-17 DIAGNOSIS — E1122 Type 2 diabetes mellitus with diabetic chronic kidney disease: Secondary | ICD-10-CM | POA: Diagnosis not present

## 2016-10-25 ENCOUNTER — Ambulatory Visit (INDEPENDENT_AMBULATORY_CARE_PROVIDER_SITE_OTHER): Payer: PPO

## 2016-10-25 DIAGNOSIS — Z7901 Long term (current) use of anticoagulants: Secondary | ICD-10-CM

## 2016-10-25 DIAGNOSIS — I4891 Unspecified atrial fibrillation: Secondary | ICD-10-CM

## 2016-10-25 LAB — POCT INR: INR: 1.7

## 2016-11-08 NOTE — Progress Notes (Signed)
Cardiology Office Note  Date:  11/09/2016   ID:  Aaron, Gross 12-23-1927, MRN [id number]  PCP:  Wilhemena Durie, MD   Chief Complaint  Patient presents with  . other    58mo f/u. Pt c/o sob, bilateral leg/ankle swelling. Reviewed meds with pt verbally.    HPI:  [name] is a 83 y.o. male w/ PMHx s/f  CAD s/p CABG 17 years ago,  permanent atrial fibrillation,  Coumadin anticoagulation,  single kidney RBBB,  HTN chronic LE edema, chronic diastolic CHF , EF >16%, mod LVH Moderate to severe  pulm HTN , L pleural effusion by echo 06/2016  He presents for routine followup of his coronary artery disease  Weight down 15 pounds or more in past year Torsemide 40 BID SOB better Weight at home 172 Previous weight in the clinic was 175 pounds  Breathing better, leg swelling Resolved Walking better  Most recent creatinine 1.68 up from baseline 1.3 several months earlier  Blood pressure low in the morning, elevated in the afternoon typically 150 or higher Takes isosorbide in the evening  EKG on today's visit shows atrial fibrillation with ventricular rate 56 bpm, right bundle branch block, nonspecific ST and T wave abnormality. No change to his EKG  Other past medical history Prior lab work, Hemoglobin A1c 6.2, total cholesterol in 2014 was 129 seen by Dr. Abigail Butts, renal service  Seen in clinic February 2015 at which time he had significant leg edema. Amlodipine was held Echocardiogram around that time showed normal LV systolic function, moderate pulmonary hypertension with pleural effusion on the left Also had severe hypertension.    PMH:   has a past medical history of Atrial fibrillation ([place]); Coronary artery disease; Edema; Hypertension; RBBB (right bundle branch block); and Squamous cell carcinoma.  PSH:    Past Surgical History:  Procedure Laterality Date  . APPENDECTOMY    . CARDIOVERSION  09/21/08  . CORONARY ARTERY BYPASS GRAFT    . HERNIA  REPAIR    . MELANOMA EXCISION     back  . NEPHRECTOMY    . PROSTATE ABLATION     transurethral needle ablation  . SQUAMOUS CELL CARCINOMA EXCISION     left hand & right ear.     Current Outpatient Prescriptions  Medication Sig Dispense Refill  . Ascorbic Acid (VITAMIN C) 500 MG tablet Take 500 mg by mouth daily.      Marland Kitchen aspirin 81 MG tablet Take 81 mg by mouth daily.      . cloNIDine (CATAPRES) 0.2 MG tablet TAKE 1 TABLET (0.2 MG TOTAL) BY MOUTH 2 (TWO) TIMES DAILY. 180 tablet 3  . glucosamine-chondroitin 500-400 MG tablet Take 1 tablet by mouth daily.      Marland Kitchen glucose blood test strip Test once daily DX E11.9 50 each 12  . isosorbide mononitrate (IMDUR) 30 MG 24 hr tablet Take 1 tablet (30 mg total) by mouth daily. 30 tablet 11  . loratadine (CLARITIN) 10 MG tablet TAKE 1 TABLET BY MOUTH DAILY AS NEEDED 30 tablet 12  . metoprolol succinate (TOPROL-XL) 100 MG 24 hr tablet TAKE 1 TABLET (100 MG TOTAL) BY MOUTH DAILY. 30 tablet 2  . Multiple Vitamin (MULTIVITAMIN) tablet Take 1 tablet by mouth daily.      . quinapril (ACCUPRIL) 40 MG tablet TAKE 1 TABLET (40 MG TOTAL) BY MOUTH 2 (TWO) TIMES DAILY. 180 tablet 0  . simvastatin (ZOCOR) 10 MG tablet TAKE ONE TABLET BY MOUTH DAILY AT BEDTIME  90 tablet 3  . Tamsulosin HCl (FLOMAX) 0.4 MG CAPS Take 0.4 mg by mouth daily.      Marland Kitchen torsemide (DEMADEX) 20 MG tablet Take 40 mg by mouth 2 (two) times daily.    . traZODone (DESYREL) 100 MG tablet Take 1 tablet (100 mg total) by mouth at bedtime. 90 tablet 3  . warfarin (COUMADIN) 5 MG tablet TAKE AS DIRECTED BY COUMADIN CLINIC 60 tablet 0   No current facility-administered medications for this visit.      Allergies:   Pollen extract   Social History:  The patient  reports that he has quit smoking. He has never used smokeless tobacco. He reports that he drinks about 2.4 - 4.8 oz of alcohol per week . He reports that he does not use drugs.   Family History:   family history includes Arthritis in his  mother; Heart attack in his father.    Review of Systems: Review of Systems  Constitutional: Negative.   Respiratory: Negative.   Cardiovascular: Negative.   Gastrointestinal: Negative.   Musculoskeletal: Negative.   Neurological: Negative.   Psychiatric/Behavioral: Negative.   All other systems reviewed and are negative.    PHYSICAL EXAM: VS:  BP (!) 168/68 (BP Location: Left Arm, Patient Position: Sitting, Cuff Size: Normal)   Pulse (!) 56   Ht 5\' 7"  (1.702 m)   Wt 177 lb 8 oz (80.5 kg)   BMI 27.80 kg/m  , BMI Body mass index is 27.8 kg/m. GEN: Well nourished, well developed, in no acute distress  HEENT: normal  Neck: no JVD, carotid bruits, or masses Cardiac: Irregularly irregular, no murmurs, rubs, or gallops,Trace ankle edema Bilaterally Respiratory:  clear to auscultation bilaterally, normal work of breathing GI: soft, nontender, nondistended, + BS MS: no deformity or atrophy  Skin: warm and dry, no rash Neuro:  Strength and sensation are intact Psych: euthymic mood, full affect    Recent Labs: 07/18/2016: ALT 28; BUN 30; Creatinine, Ser 1.42; Platelets 146; Potassium 4.2; Sodium 146; TSH 3.130    Lipid Panel Lab Results  Component Value Date   CHOL 127 07/18/2016   HDL 56 07/18/2016   LDLCALC 54 07/18/2016   TRIG 87 07/18/2016      Wt Readings from Last 3 Encounters:  11/09/16 177 lb 8 oz (80.5 kg)  07/17/16 186 lb (84.4 kg)  07/11/16 186 lb 4 oz (84.5 kg)       ASSESSMENT AND PLAN:  HYPERTENSION, BENIGN - Plan: EKG 12-Lead Elevated blood pressure in the afternoon, low in the morning though denies orthostasis. Recommended he move isosorbide to the morning  Atherosclerosis of native coronary artery of native heart without angina pectoris - Plan: EKG 12-Lead  Permanent atrial fibrillation (HCC) - Plan: EKG 12-Lead Tolerating anticoagulation, rate well controlled  Chronic diastolic CHF (congestive heart failure) (HCC) - Plan: EKG  12-Lead Suspect mild overdiuresis, goal weight likely 175 pounds, currently 172 pounds at home Recommended he hold torsemide on Sunday  Mixed hyperlipidemia - Plan: EKG 12-Lead Cholesterol is at goal on the current lipid regimen. No changes to the medications were made.  Localized edema - Plan: EKG 12-Lead Edema has resolved after 15 pound weight loss over the past year  Shortness of breath - Plan: EKG 12-Lead Prior history of pulmonary hypertension, elevated right heart pressures, now down 15 pounds Shortness of breath dramatically improved, feels back to his baseline  Chronic renal impairment, stage 3 (moderate) - Plan: EKG 12-Lead Creatinine up to 1.68, suspect mild  overdiuresis, hypovolemia Suggested he hold torsemide on Sunday  Type 2 diabetes mellitus with other circulatory complication, unspecified long term insulin use status ([place]) - Plan: EKG 12-Lead We have encouraged continued exercise, careful diet management in an effort to lose weight.   Total encounter time more than 25 minutes  Greater than 50% was spent in counseling and coordination of care with the patient   Disposition:   F/U  6 months   Orders Placed This Encounter  Procedures  . EKG 12-Lead     Signed, Esmond Plants, M.D., Ph.D. 11/09/2016  [place], [place]

## 2016-11-09 ENCOUNTER — Ambulatory Visit (INDEPENDENT_AMBULATORY_CARE_PROVIDER_SITE_OTHER): Payer: PPO | Admitting: Cardiovascular Disease

## 2016-11-09 ENCOUNTER — Encounter: Payer: Self-pay | Admitting: Cardiovascular Disease

## 2016-11-09 VITALS — BP 168/68 | HR 56 | Ht 67.0 in | Wt 177.5 lb

## 2016-11-09 DIAGNOSIS — N183 Chronic kidney disease, stage 3 unspecified: Secondary | ICD-10-CM

## 2016-11-09 DIAGNOSIS — I10 Essential (primary) hypertension: Secondary | ICD-10-CM | POA: Diagnosis not present

## 2016-11-09 DIAGNOSIS — E782 Mixed hyperlipidemia: Secondary | ICD-10-CM | POA: Diagnosis not present

## 2016-11-09 DIAGNOSIS — I5032 Chronic diastolic (congestive) heart failure: Secondary | ICD-10-CM

## 2016-11-09 DIAGNOSIS — I4821 Permanent atrial fibrillation: Secondary | ICD-10-CM

## 2016-11-09 DIAGNOSIS — I2510 Atherosclerotic heart disease of native coronary artery without angina pectoris: Secondary | ICD-10-CM

## 2016-11-09 DIAGNOSIS — R600 Localized edema: Secondary | ICD-10-CM

## 2016-11-09 DIAGNOSIS — E1159 Type 2 diabetes mellitus with other circulatory complications: Secondary | ICD-10-CM

## 2016-11-09 DIAGNOSIS — R0602 Shortness of breath: Secondary | ICD-10-CM

## 2016-11-09 DIAGNOSIS — I482 Chronic atrial fibrillation: Secondary | ICD-10-CM

## 2016-11-09 NOTE — Patient Instructions (Addendum)
Medication Instructions:   Consider skipping the torsemide on Sunday  Consider taking the isosorbide in the morning   Labwork:  No new labs needed  Testing/Procedures:  No further testing at this time   I recommend watching educational videos on topics of interest to you at:       [email or web address]  Enter code: HEARTCARE    Follow-Up: It was a pleasure seeing you in the office today. Please call us if you have new issues that need to be addressed before your next appt.  [phone]  Your physician wants you to follow-up in: 6 months.  You will receive a reminder letter in the mail two months in advance. If you don't receive a letter, please call our office to schedule the follow-up appointment.  If you need a refill on your cardiac medications before your next appointment, please call your pharmacy.

## 2016-11-12 ENCOUNTER — Other Ambulatory Visit: Payer: Self-pay | Admitting: Cardiovascular Disease

## 2016-11-15 ENCOUNTER — Ambulatory Visit (INDEPENDENT_AMBULATORY_CARE_PROVIDER_SITE_OTHER): Payer: PPO

## 2016-11-15 DIAGNOSIS — I4891 Unspecified atrial fibrillation: Secondary | ICD-10-CM | POA: Diagnosis not present

## 2016-11-15 DIAGNOSIS — Z7901 Long term (current) use of anticoagulants: Secondary | ICD-10-CM

## 2016-11-15 LAB — POCT INR: INR: 2.2

## 2016-11-20 ENCOUNTER — Telehealth: Payer: Self-pay | Admitting: Cardiovascular Disease

## 2016-11-20 MED ORDER — TORSEMIDE 20 MG PO TABS: 20.0000 mg | ORAL_TABLET | Freq: Two times a day (BID) | ORAL | 3 refills | Status: DC | PRN

## 2016-11-20 NOTE — Telephone Encounter (Addendum)
Spoke with Mr. Westbrook regarding his Torsemide dose, he is currently taking torsemide 20 mg (2 tablets) twice a day which was changed in March from his nephrologist and Dr. [name] told him at the pt's last office visit in April to skip the Sunday dose.  Please advise if Dr. [name] okay if we send in the Rx for Torsemide under his name.

## 2016-11-20 NOTE — Telephone Encounter (Signed)
Ok to refill 

## 2016-11-20 NOTE — Telephone Encounter (Signed)
°*  STAT* If patient is at the pharmacy, call can be transferred to refill team.   1. Which medications need to be refilled? (please list name of each medication and dose if known)  Torsemide 20 mg po QID   2. Which pharmacy/location (including street and city if local pharmacy) is medication to be sent to?  [facility] [address] [place]   3. Do they need a 30 day or 90 day supply? [place]

## 2016-11-21 ENCOUNTER — Other Ambulatory Visit: Payer: Self-pay | Admitting: Cardiovascular Disease

## 2016-11-21 DIAGNOSIS — D485 Neoplasm of uncertain behavior of skin: Secondary | ICD-10-CM | POA: Diagnosis not present

## 2016-11-21 DIAGNOSIS — Z85820 Personal history of malignant melanoma of skin: Secondary | ICD-10-CM | POA: Diagnosis not present

## 2016-11-21 DIAGNOSIS — D044 Carcinoma in situ of skin of scalp and neck: Secondary | ICD-10-CM | POA: Diagnosis not present

## 2016-11-21 DIAGNOSIS — X32XXXA Exposure to sunlight, initial encounter: Secondary | ICD-10-CM | POA: Diagnosis not present

## 2016-11-21 DIAGNOSIS — Z872 Personal history of diseases of the skin and subcutaneous tissue: Secondary | ICD-10-CM | POA: Diagnosis not present

## 2016-11-21 DIAGNOSIS — L570 Actinic keratosis: Secondary | ICD-10-CM | POA: Diagnosis not present

## 2016-11-21 DIAGNOSIS — Z85828 Personal history of other malignant neoplasm of skin: Secondary | ICD-10-CM | POA: Diagnosis not present

## 2016-11-21 DIAGNOSIS — Z08 Encounter for follow-up examination after completed treatment for malignant neoplasm: Secondary | ICD-10-CM | POA: Diagnosis not present

## 2016-11-22 NOTE — Telephone Encounter (Signed)
Please review for refill. Thanks!  

## 2016-11-26 ENCOUNTER — Other Ambulatory Visit: Payer: Self-pay | Admitting: Cardiovascular Disease

## 2016-12-02 ENCOUNTER — Other Ambulatory Visit: Payer: Self-pay | Admitting: Cardiovascular Disease

## 2016-12-13 ENCOUNTER — Ambulatory Visit (INDEPENDENT_AMBULATORY_CARE_PROVIDER_SITE_OTHER): Payer: PPO | Admitting: *Deleted

## 2016-12-13 DIAGNOSIS — I4891 Unspecified atrial fibrillation: Secondary | ICD-10-CM | POA: Diagnosis not present

## 2016-12-13 DIAGNOSIS — Z7901 Long term (current) use of anticoagulants: Secondary | ICD-10-CM

## 2016-12-13 LAB — POCT INR: INR: 2.8

## 2016-12-18 DIAGNOSIS — D044 Carcinoma in situ of skin of scalp and neck: Secondary | ICD-10-CM | POA: Diagnosis not present

## 2016-12-21 ENCOUNTER — Other Ambulatory Visit: Payer: Self-pay | Admitting: Cardiovascular Disease

## 2017-01-10 ENCOUNTER — Ambulatory Visit (INDEPENDENT_AMBULATORY_CARE_PROVIDER_SITE_OTHER): Payer: PPO | Admitting: *Deleted

## 2017-01-10 DIAGNOSIS — Z7901 Long term (current) use of anticoagulants: Secondary | ICD-10-CM

## 2017-01-10 DIAGNOSIS — I4891 Unspecified atrial fibrillation: Secondary | ICD-10-CM | POA: Diagnosis not present

## 2017-01-10 LAB — POCT INR: INR: 2.6

## 2017-01-15 ENCOUNTER — Encounter: Payer: Self-pay | Admitting: Family Medicine

## 2017-01-15 ENCOUNTER — Ambulatory Visit (INDEPENDENT_AMBULATORY_CARE_PROVIDER_SITE_OTHER): Payer: PPO | Admitting: Family Medicine

## 2017-01-15 VITALS — BP 144/60 | HR 52 | Temp 97.5°F | Resp 16 | Wt 179.0 lb

## 2017-01-15 DIAGNOSIS — I10 Essential (primary) hypertension: Secondary | ICD-10-CM

## 2017-01-15 DIAGNOSIS — E1159 Type 2 diabetes mellitus with other circulatory complications: Secondary | ICD-10-CM | POA: Diagnosis not present

## 2017-01-15 LAB — POCT GLYCOSYLATED HEMOGLOBIN (HGB A1C): Hemoglobin A1C: 5.9

## 2017-01-15 MED ORDER — ISOSORBIDE MONONITRATE ER 60 MG PO TB24: 60.0000 mg | ORAL_TABLET | Freq: Every day | ORAL | 11 refills | Status: DC

## 2017-01-15 NOTE — Progress Notes (Signed)
Subjective:  HPI  Diabetes Mellitus Type II, Follow-up:   Lab Results  Component Value Date   HGBA1C 6.1 (H) 07/18/2016   HGBA1C 5.9 03/21/2016   HGBA1C 6.5 11/08/2015    Last seen for diabetes 6 months ago.  Management since then includes none. He reports good compliance with treatment. He is not having side effects.  Home blood sugar records: 120-140  Episodes of hypoglycemia? no   Current Insulin Regimen: n/a Current exercise: nothing formal but still active.   Pertinent Labs:    Component Value Date/Time   CHOL 127 07/18/2016 1001   TRIG 87 07/18/2016 1001   TRIG 46 02/12/2009 0000   HDL 56 07/18/2016 1001   LDLCALC 54 07/18/2016 1001   CREATININE 1.42 (H) 07/18/2016 1001    Wt Readings from Last 3 Encounters:  01/15/17 179 lb (81.2 kg)  11/09/16 177 lb 8 oz (80.5 kg)  07/17/16 186 lb (84.4 kg)    ------------------------------------------------------------------------   Hypertension, follow-up:  BP Readings from Last 3 Encounters:  01/15/17 (!) 144/60  11/09/16 (!) 168/68  07/17/16 (!) 142/78    He was last seen for hypertension 6 months ago.  BP at that visit was 168/68. Management since that visit includes none. He reports good compliance with treatment. He is not having side effects.  He is not exercising. Outside blood pressures are 140/50-60 in the mornings but in the evenings 150-180/70-80's. He is experiencing dyspnea.  Patient denies chest pain, chest pressure/discomfort, claudication, exertional chest pressure/discomfort, fatigue, irregular heart beat, lower extremity edema, near-syncope, orthopnea, palpitations, paroxysmal nocturnal dyspnea, syncope and tachypnea.   Wt Readings from Last 3 Encounters:  01/15/17 179 lb (81.2 kg)  11/09/16 177 lb 8 oz (80.5 kg)  07/17/16 186 lb (84.4 kg)  ------------------------------------------------------------------------    Prior to Admission medications   Medication Sig Start Date End Date  Taking? Authorizing Provider  Ascorbic Acid (VITAMIN C) 500 MG tablet Take 500 mg by mouth daily.      [provider]  aspirin 81 MG tablet Take 81 mg by mouth daily.      [provider]  cloNIDine (CATAPRES) 0.2 MG tablet TAKE 1 TABLET (0.2 MG TOTAL) BY MOUTH 2 (TWO) TIMES DAILY. 03/22/16   Minna Merritts, MD  glucosamine-chondroitin 500-400 MG tablet Take 1 tablet by mouth daily.      [provider]  glucose blood test strip Test once daily DX E11.9 01/17/16   Jerrol Banana., MD  isosorbide mononitrate (IMDUR) 30 MG 24 hr tablet Take 1 tablet (30 mg total) by mouth daily. 07/11/16 07/11/17  Minna Merritts, MD  loratadine (CLARITIN) 10 MG tablet TAKE 1 TABLET BY MOUTH DAILY AS NEEDED 01/28/16   Jerrol Banana., MD  metoprolol succinate (TOPROL-XL) 100 MG 24 hr tablet TAKE 1 TABLET (100 MG TOTAL) BY MOUTH DAILY. 12/04/16   Minna Merritts, MD  Multiple Vitamin (MULTIVITAMIN) tablet Take 1 tablet by mouth daily.      [provider]  quinapril (ACCUPRIL) 40 MG tablet TAKE 1 TABLET (40 MG TOTAL) BY MOUTH 2 (TWO) TIMES DAILY. 12/21/16   Minna Merritts, MD  simvastatin (ZOCOR) 10 MG tablet TAKE ONE TABLET BY MOUTH DAILY AT BEDTIME 11/27/16   Minna Merritts, MD  Tamsulosin HCl (FLOMAX) 0.4 MG CAPS Take 0.4 mg by mouth daily.      [provider]  torsemide (DEMADEX) 20 MG tablet Take 1 tablet (20 mg total) by mouth  2 (two) times daily as needed. 11/20/16   Minna Merritts, MD  traZODone (DESYREL) 100 MG tablet Take 1 tablet (100 mg total) by mouth at bedtime. 07/17/16   Jerrol Banana., MD  warfarin (COUMADIN) 5 MG tablet TAKE AS DIRECTED BY COUMADIN CLINIC 11/22/16   Minna Merritts, MD    Patient Active Problem List   Diagnosis Date Noted  . Actinic keratoses 12/10/2014  . Allergic rhinitis 12/10/2014  . A-fib ([place]) 12/10/2014  . CCF (congestive cardiac failure) ([place]) 12/10/2014  . Acid reflux 12/10/2014  . HLD  (hyperlipidemia) 12/10/2014  . BP (high blood pressure) 12/10/2014  . Arthritis, degenerative 12/10/2014  . Diabetes mellitus, type 2 ([place]) 12/10/2014  . Chronic renal insufficiency 03/05/2014  . Status post nephrectomy 03/05/2014  . Chronic diastolic CHF (congestive heart failure) ([place]) 10/13/2013  . Pre-op evaluation 09/17/2013  . Permanent atrial fibrillation ([place]) 09/17/2013  . Long term current use of anticoagulant 10/25/2010  . Shortness of breath 11/01/2009  . CAD, NATIVE VESSEL 10/22/2009  . HYPERTENSION, BENIGN 03/09/2009  . RBBB 12/29/2008  . EDEMA 12/29/2008    Past Medical History:  Diagnosis Date  . Atrial fibrillation (HCC)    coumadin therapy  . Coronary artery disease    native vessel  . Edema   . Hypertension   . RBBB (right bundle branch block)   . Squamous cell carcinoma    left hand & right ear     Social History   Social History  . Marital status: Married    Spouse name: N/A  . Number of children: 3  . Years of education: N/A   Occupational History  . 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 Smoking status: Former Research scientist (life sciences)  . Smokeless tobacco: Never Used  . Alcohol use 2.4 - 4.8 oz/week    4 - 8 Standard drinks or equivalent per week  . Drug use: No  . Sexual activity: Not on file   Other Topics Concern  . Not on file   Social History Narrative   He lives with his wife.          Allergies  Allergen Reactions  . Pollen Extract     Review of Systems  Constitutional: Negative.   HENT: Negative.   Eyes: Negative.   Respiratory: Positive for shortness of breath (nothing new for pt, has had it for about a year now. ).   Cardiovascular: Negative.   Gastrointestinal: Negative.   Genitourinary: Negative.   Musculoskeletal: Negative.   Skin: Negative.   Neurological: Negative.   Endo/Heme/Allergies: Negative.   Psychiatric/Behavioral: Negative.     Immunization History  Administered Date(s) Administered   . Influenza, High Dose Seasonal PF 05/27/2015, 05/11/2016  . Pneumococcal Conjugate-13 05/12/2014  . Pneumococcal Polysaccharide-23 06/07/1996  . Tdap 04/03/2011    Objective:  BP (!) 144/60 (BP Location: Left Arm, Patient Position: Sitting, Cuff Size: Normal)   Pulse (!) 52   Temp 97.5 F (36.4 C) (Oral)   Resp 16   Wt 179 lb (81.2 kg)   SpO2 95%   BMI 28.04 kg/m   Physical Exam  Constitutional: He is oriented to person, place, and time and well-developed, well-nourished, and in no distress.  HENT:  Head: Normocephalic and atraumatic.  Right Ear: External ear normal.  Left Ear: External ear normal.  Nose: Nose normal.  Eyes: Conjunctivae and EOM are normal. Pupils are equal, round, and reactive to light.  Neck: Normal range  of motion. Neck supple.  Cardiovascular: Normal rate, regular rhythm, normal heart sounds and intact distal pulses.   Pulmonary/Chest: Effort normal and breath sounds normal.  Abdominal: Soft.  Musculoskeletal: Normal range of motion. He exhibits edema (trace).  Neurological: He is alert and oriented to person, place, and time. He has normal reflexes. Gait normal. GCS score is 15.  Skin: Skin is warm and dry.  Psychiatric: Mood, memory, affect and judgment normal.    Lab Results  Component Value Date   WBC 5.1 07/18/2016   HGB 14.2 07/18/2016   HCT 40.6 07/18/2016   PLT 146 (L) 07/18/2016   GLUCOSE 109 (H) 07/18/2016   CHOL 127 07/18/2016   TRIG 87 07/18/2016   HDL 56 07/18/2016   LDLCALC 54 07/18/2016   TSH 3.130 07/18/2016   INR 2.6 01/10/2017   HGBA1C 6.1 (H) 07/18/2016   MICROALBUR 100 03/21/2016    CMP     Component Value Date/Time   NA 146 (H) 07/18/2016 1001   K 4.2 07/18/2016 1001   CL 101 07/18/2016 1001   CO2 27 07/18/2016 1001   GLUCOSE 109 (H) 07/18/2016 1001   GLUCOSE 118 (H) 09/21/2008 1139   BUN 30 (H) 07/18/2016 1001   CREATININE 1.42 (H) 07/18/2016 1001   CALCIUM 8.8 07/18/2016 1001   PROT 6.1 07/18/2016 1001    ALBUMIN 4.0 07/18/2016 1001   AST 28 07/18/2016 1001   ALT 28 07/18/2016 1001   ALKPHOS 83 07/18/2016 1001   BILITOT 0.6 07/18/2016 1001   GFRNONAA 45 (L) 07/18/2016 1001   GFRAA 52 (L) 07/18/2016 1001    Assessment and Plan :  1. Type 2 diabetes mellitus with other circulatory complications (HCC)  - POCT HgB A1C 5.9 today. Stable.   2. HYPERTENSION, BENIGN Elevated in the evenings. Increase isosorbide to 60 mg ER. Follow up in 2 months.  - isosorbide mononitrate (IMDUR) 60 MG 24 hr tablet; Take 1 tablet (60 mg total) by mouth daily.  Dispense: 30 tablet; Refill: 11 3.HLD 4.CAD   HPI, Exam, and A&P Transcribed under the direction and in the presence of Richard L. Cranford Mon, MD  Electronically Signed: Katina Dung, [place] MD [facility] 01/15/2017 11:10 AM

## 2017-01-16 DIAGNOSIS — Z85528 Personal history of other malignant neoplasm of kidney: Secondary | ICD-10-CM | POA: Diagnosis not present

## 2017-01-16 DIAGNOSIS — N401 Enlarged prostate with lower urinary tract symptoms: Secondary | ICD-10-CM | POA: Diagnosis not present

## 2017-01-16 DIAGNOSIS — R3914 Feeling of incomplete bladder emptying: Secondary | ICD-10-CM | POA: Diagnosis not present

## 2017-01-16 DIAGNOSIS — R351 Nocturia: Secondary | ICD-10-CM | POA: Diagnosis not present

## 2017-01-16 DIAGNOSIS — R350 Frequency of micturition: Secondary | ICD-10-CM | POA: Diagnosis not present

## 2017-01-25 DIAGNOSIS — N2581 Secondary hyperparathyroidism of renal origin: Secondary | ICD-10-CM | POA: Diagnosis not present

## 2017-01-25 DIAGNOSIS — D631 Anemia in chronic kidney disease: Secondary | ICD-10-CM | POA: Diagnosis not present

## 2017-01-25 DIAGNOSIS — N183 Chronic kidney disease, stage 3 (moderate): Secondary | ICD-10-CM | POA: Diagnosis not present

## 2017-01-25 DIAGNOSIS — I10 Essential (primary) hypertension: Secondary | ICD-10-CM | POA: Diagnosis not present

## 2017-01-25 DIAGNOSIS — I509 Heart failure, unspecified: Secondary | ICD-10-CM | POA: Diagnosis not present

## 2017-01-25 DIAGNOSIS — E875 Hyperkalemia: Secondary | ICD-10-CM | POA: Diagnosis not present

## 2017-01-25 DIAGNOSIS — I129 Hypertensive chronic kidney disease with stage 1 through stage 4 chronic kidney disease, or unspecified chronic kidney disease: Secondary | ICD-10-CM | POA: Diagnosis not present

## 2017-01-30 DIAGNOSIS — N183 Chronic kidney disease, stage 3 (moderate): Secondary | ICD-10-CM | POA: Diagnosis not present

## 2017-01-30 DIAGNOSIS — N2581 Secondary hyperparathyroidism of renal origin: Secondary | ICD-10-CM | POA: Diagnosis not present

## 2017-01-30 DIAGNOSIS — R809 Proteinuria, unspecified: Secondary | ICD-10-CM | POA: Diagnosis not present

## 2017-01-30 DIAGNOSIS — I129 Hypertensive chronic kidney disease with stage 1 through stage 4 chronic kidney disease, or unspecified chronic kidney disease: Secondary | ICD-10-CM | POA: Diagnosis not present

## 2017-02-09 ENCOUNTER — Ambulatory Visit: Payer: PPO | Admitting: Cardiovascular Disease

## 2017-02-14 ENCOUNTER — Other Ambulatory Visit: Payer: Self-pay | Admitting: Family Medicine

## 2017-02-14 ENCOUNTER — Ambulatory Visit (INDEPENDENT_AMBULATORY_CARE_PROVIDER_SITE_OTHER): Payer: PPO | Admitting: *Deleted

## 2017-02-14 DIAGNOSIS — I4891 Unspecified atrial fibrillation: Secondary | ICD-10-CM | POA: Diagnosis not present

## 2017-02-14 DIAGNOSIS — Z7901 Long term (current) use of anticoagulants: Secondary | ICD-10-CM | POA: Diagnosis not present

## 2017-02-14 LAB — POCT INR: INR: 2.1

## 2017-02-16 NOTE — Progress Notes (Signed)
Cardiology Office Note  Date:  02/20/2017   ID:  [name], [name] 1927-09-09, MRN [id number]  PCP:  Jerrol Banana., MD   Chief Complaint  Patient presents with  . other     Early 6 month follow up per [name]. Pt c/o sob and high BP; denies cp. Reviewed meds with pt verbally.    HPI:   [name] is a 83 y.o. male w/ PMHx s/f  CAD s/p CABG 17 years ago,  permanent atrial fibrillation,  Coumadin anticoagulation,  single kidney RBBB,  HTN chronic LE edema, chronic diastolic CHF , EF >46%, mod LVH Moderate to severe  pulm HTN , L pleural effusion by echo 06/2016  He presents for routine followup of his coronary artery disease  In follow-up today he reports he is taking Torsemide 20 up to 40 ,  rarely 40 BID for shortness of breath CR 1.5 on recent check by nephrology SOB stable Weight at home 172, stable Previous weight in the clinic was 175 pounds  no regular exercise program  Blood pressure low in the morning, elevated in the afternoon typically 150 or higher Heart rate running in the high 30s to 40s at times  EKG on today's visit shows atrial fibrillation with ventricular rate 40 bpm, right bundle branch block, nonspecific ST and T wave abnormality.   Other past medical history Prior lab work, Hemoglobin A1c 6.2, total cholesterol in 2014 was 129 seen by Aaron. Abigail Gross, renal service  Seen in clinic February 2015 at which time he had significant leg edema. Amlodipine was held Echocardiogram around that time showed normal LV systolic function, moderate pulmonary hypertension with pleural effusion on the left Also had severe hypertension.    PMH:   has a past medical history of Atrial fibrillation ([name]); Coronary artery disease; Edema; Hypertension; RBBB (right bundle branch block); and Squamous cell carcinoma.  PSH:    Past Surgical History:  Procedure Laterality Date  . APPENDECTOMY    . CARDIOVERSION  09/21/08  . CORONARY ARTERY BYPASS  GRAFT    . HERNIA REPAIR    . MELANOMA EXCISION     back  . NEPHRECTOMY    . PROSTATE ABLATION     transurethral needle ablation  . SQUAMOUS CELL CARCINOMA EXCISION     left hand & right ear.     Current Outpatient Prescriptions  Medication Sig Dispense Refill  . Ascorbic Acid (VITAMIN C) 500 MG tablet Take 500 mg by mouth daily.      . cloNIDine (CATAPRES) 0.2 MG tablet TAKE 1 TABLET (0.2 MG TOTAL) BY MOUTH 2 (TWO) TIMES DAILY. 180 tablet 3  . glucosamine-chondroitin 500-400 MG tablet Take 1 tablet by mouth daily.      . isosorbide mononitrate (IMDUR) 60 MG 24 hr tablet Take 1 tablet (60 mg total) by mouth daily. 30 tablet 11  . loratadine (CLARITIN) 10 MG tablet TAKE 1 TABLET BY MOUTH DAILY AS NEEDED 30 tablet 12  . metoprolol succinate (TOPROL-XL) 25 MG 24 hr tablet Take 1 tablet (25 mg total) by mouth daily. Take with or immediately following a meal. 90 tablet 4  . Multiple Vitamin (MULTIVITAMIN) tablet Take 1 tablet by mouth daily.      . ONE TOUCH ULTRA TEST test strip USE TO TEST BLOOD SUGAR ONCE DAILY AS DIRECTED 100 each 11  . quinapril (ACCUPRIL) 40 MG tablet TAKE 1 TABLET (40 MG TOTAL) BY MOUTH 2 (TWO) TIMES DAILY. 180 tablet 0  .  simvastatin (ZOCOR) 10 MG tablet TAKE ONE TABLET BY MOUTH DAILY AT BEDTIME 90 tablet 3  . Tamsulosin HCl (FLOMAX) 0.4 MG CAPS Take 0.4 mg by mouth daily.      Marland Kitchen torsemide (DEMADEX) 20 MG tablet Take 40 mg by mouth 2 (two) times daily. PRN    . traZODone (DESYREL) 100 MG tablet Take 1 tablet (100 mg total) by mouth at bedtime. 90 tablet 3  . warfarin (COUMADIN) 5 MG tablet TAKE AS DIRECTED BY COUMADIN CLINIC 60 tablet 0  . hydrALAZINE (APRESOLINE) 50 MG tablet Take 1 tablet (50 mg total) by mouth 3 (three) times daily as needed. 90 tablet 3   No current facility-administered medications for this visit.      Allergies:   Pollen extract   Social History:  The patient  reports that he quit smoking about 41 years ago. He has never used smokeless  tobacco. He reports that he drinks about 4.2 - 6.6 oz of alcohol per week . He reports that he does not use drugs.   Family History:   family history includes Arthritis in his mother; Heart attack in his father.    Review of Systems: Review of Systems  Constitutional: Negative.   Respiratory: Negative.   Cardiovascular: Negative.   Gastrointestinal: Negative.   Musculoskeletal: Negative.   Neurological: Negative.   Psychiatric/Behavioral: Negative.   All other systems reviewed and are negative.    PHYSICAL EXAM: VS:  BP (!) 154/62 (BP Location: Left Arm, Patient Position: Sitting, Cuff Size: Normal)   Pulse (!) 40   Ht 5\' 7"  (1.702 m)   Wt 176 lb 8 oz (80.1 kg)   BMI 27.64 kg/m  , BMI Body mass index is 27.64 kg/m.  GEN: Well nourished, well developed, in no acute distress  HEENT: normal  Neck: no JVD, carotid bruits, or masses Cardiac: Irregularly irregular, no murmurs, rubs, or gallops,Trace ankle edema Bilaterally Respiratory:  clear to auscultation bilaterally, normal work of breathing GI: soft, nontender, nondistended, + BS MS: no deformity or atrophy  Skin: warm and dry, no rash Neuro:  Strength and sensation are intact Psych: euthymic mood, full affect    Recent Labs: 07/18/2016: ALT 28; BUN 30; Creatinine, Ser 1.42; Hemoglobin 14.2; Platelets 146; Potassium 4.2; Sodium 146; TSH 3.130    Lipid Panel Lab Results  Component Value Date   CHOL 127 07/18/2016   HDL 56 07/18/2016   LDLCALC 54 07/18/2016   TRIG 87 07/18/2016      Wt Readings from Last 3 Encounters:  02/20/17 176 lb 8 oz (80.1 kg)  01/15/17 179 lb (81.2 kg)  11/09/16 177 lb 8 oz (80.5 kg)       ASSESSMENT AND PLAN:  HYPERTENSION, BENIGN - Plan: EKG 12-Lead Recommended he decrease metoprolol down to 25 mg in the morning, previously 100 Significant bradycardia noted with fatigue Recommended he take hydralazine 50 mg at 2 PM for high blood pressures in the afternoon and evening He will  continue to monitor blood pressure and heart rate  Atherosclerosis of native coronary artery of native heart without angina pectoris -  Currently with no symptoms of angina. No further workup at this time. Continue current medication regimen.  Permanent atrial fibrillation ([place]) - Plan: EKG 12-Lead Tolerating anticoagulation,  Decrease metoprolol as above  Chronic diastolic CHF (congestive heart failure) (HCC) -  We'll continue current torsemide regiment 20 up to 40 mg daily Rarely 40 twice a day for significant weight gain or shortness of breath Weight  has been stable  Mixed hyperlipidemia - Plan: EKG 12-Lead Cholesterol is at goal on the current lipid regimen. No changes to the medications were made.  Localized edema - Plan: EKG 12-Lead No significant leg edema on today's visit  Shortness of breath - Plan: EKG 12-Lead Shortness of breath  back to his baseline  Chronic renal impairment, stage 3 (moderate) - Plan: EKG 12-Lead Creatinine up to 1.5, at his baseline  Type 2 diabetes mellitus with other circulatory complication, unspecified long term insulin use status ([place]) - Plan: EKG 12-Lead We have encouraged continued exercise, careful diet management in an effort to lose weight.   Total encounter time more than 25 minutes  Greater than 50% was spent in counseling and coordination of care with the patient   Disposition:   F/U  6 months   Orders Placed This Encounter  Procedures  . EKG 12-Lead     Signed, Esmond Plants, M.D., Ph.D. 02/20/2017  [place], [place]

## 2017-02-19 ENCOUNTER — Telehealth: Payer: Self-pay | Admitting: Family Medicine

## 2017-02-20 ENCOUNTER — Ambulatory Visit (INDEPENDENT_AMBULATORY_CARE_PROVIDER_SITE_OTHER): Payer: PPO | Admitting: Cardiovascular Disease

## 2017-02-20 ENCOUNTER — Encounter: Payer: Self-pay | Admitting: Cardiovascular Disease

## 2017-02-20 VITALS — BP 154/62 | HR 40 | Ht 67.0 in | Wt 176.5 lb

## 2017-02-20 DIAGNOSIS — I25118 Atherosclerotic heart disease of native coronary artery with other forms of angina pectoris: Secondary | ICD-10-CM

## 2017-02-20 DIAGNOSIS — I4821 Permanent atrial fibrillation: Secondary | ICD-10-CM

## 2017-02-20 DIAGNOSIS — I5032 Chronic diastolic (congestive) heart failure: Secondary | ICD-10-CM | POA: Diagnosis not present

## 2017-02-20 DIAGNOSIS — I10 Essential (primary) hypertension: Secondary | ICD-10-CM

## 2017-02-20 DIAGNOSIS — E1159 Type 2 diabetes mellitus with other circulatory complications: Secondary | ICD-10-CM | POA: Diagnosis not present

## 2017-02-20 DIAGNOSIS — I482 Chronic atrial fibrillation: Secondary | ICD-10-CM

## 2017-02-20 MED ORDER — HYDRALAZINE HCL 50 MG PO TABS: 50.0000 mg | ORAL_TABLET | Freq: Three times a day (TID) | ORAL | 3 refills | Status: DC | PRN

## 2017-02-20 MED ORDER — METOPROLOL SUCCINATE ER 25 MG PO TB24: 25.0000 mg | ORAL_TABLET | Freq: Every day | ORAL | 4 refills | Status: DC

## 2017-02-20 NOTE — Patient Instructions (Addendum)
Medication Instructions:   Please decrease the metoprolol down to 25 mg daily  Start hydralazine 50 mg at 2 pm with torsemide  Stop the aspirin   Labwork:  No new labs needed  Testing/Procedures:  No further testing at this time   Follow-Up: It was a pleasure seeing you in the office today. Please call us if you have new issues that need to be addressed before your next appt.  [phone]  Your physician wants you to follow-up in: 3 months.  You will receive a reminder letter in the mail two months in advance. If you don't receive a letter, please call our office to schedule the follow-up appointment.  If you need a refill on your cardiac medications before your next appointment, please call your pharmacy.

## 2017-02-22 ENCOUNTER — Telehealth: Payer: Self-pay | Admitting: Cardiovascular Disease

## 2017-02-22 NOTE — Telephone Encounter (Signed)
Pt calling stating he started his new medication regimen yesterday and with that being said he had dizzy spells along with SOB  He kept an eye on his BP and HR   430 pm 145/69 HR 68   This morning HR 76   Would like to know if these symptoms are okay and also if the HR readings are acceptable   Please advise.

## 2017-02-22 NOTE — Telephone Encounter (Signed)
Spoke w/ pt.  He reports med changes yesterday and is concerned about his readings. He was dizzy yesterday after taking hydralazine, BP 145/69, HR 68. This am 115/69, HR 72. His HR was previously in the 40-50s and he wants to make sure that it is not too high. Advised him that 60-100 are normal readings and that his HR was previously too low. Recommended he continue to monitor and call back w/ any further questions or concerns.  He is appreciative of the call.

## 2017-02-22 NOTE — Telephone Encounter (Signed)
No answer. Left message to call back.   

## 2017-03-03 ENCOUNTER — Other Ambulatory Visit: Payer: Self-pay | Admitting: Cardiovascular Disease

## 2017-03-06 ENCOUNTER — Other Ambulatory Visit: Payer: Self-pay | Admitting: Cardiovascular Disease

## 2017-03-06 NOTE — Telephone Encounter (Signed)
Please review for refill, thanks ! 

## 2017-03-17 ENCOUNTER — Other Ambulatory Visit: Payer: Self-pay | Admitting: Cardiovascular Disease

## 2017-03-20 ENCOUNTER — Ambulatory Visit (INDEPENDENT_AMBULATORY_CARE_PROVIDER_SITE_OTHER): Payer: PPO | Admitting: Family Medicine

## 2017-03-20 VITALS — BP 140/68 | HR 60 | Temp 97.9°F | Resp 16 | Wt 178.0 lb

## 2017-03-20 DIAGNOSIS — I10 Essential (primary) hypertension: Secondary | ICD-10-CM

## 2017-03-20 DIAGNOSIS — E1159 Type 2 diabetes mellitus with other circulatory complications: Secondary | ICD-10-CM

## 2017-03-20 DIAGNOSIS — I25118 Atherosclerotic heart disease of native coronary artery with other forms of angina pectoris: Secondary | ICD-10-CM | POA: Diagnosis not present

## 2017-03-20 LAB — POCT UA - MICROALBUMIN: Microalbumin Ur, POC: 50 mg/L

## 2017-03-20 NOTE — Progress Notes (Signed)
[name]  MRN: [id number] DOB: 04/05/1928  Subjective:  HPI   The patient is an 83 year old male who presents to the office for follow up of his hypertension.  His last visit was on 01/15/17 and at that tine he was instructed to inc crease his Imdur from 30-6 mg.  Since that time he was seen by Dr. Arta Bruce.  He has decreased his Metoprolol from 100 mg to 25 mg, he started him on Hydrolozine 25 mg.  The chart said three time daily but in the patient instructions from Dr Donivan Scull office it said once daily at 2 pm with his Torsemide.  The patient has been taking it just at 2 pm. The patient has been checking his blood pressure at home and has been getting readings that range 110's-140's over 50's-70's. The patinen reports that he did get a little dizzy when he first started the Hydrolozine but that has gotten better.  Patient Active Problem List   Diagnosis Date Noted  . Actinic keratoses 12/10/2014  . Allergic rhinitis 12/10/2014  . A-fib ([place]) 12/10/2014  . CCF (congestive cardiac failure) ([name]) 12/10/2014  . Acid reflux 12/10/2014  . HLD (hyperlipidemia) 12/10/2014  . BP (high blood pressure) 12/10/2014  . Arthritis, degenerative 12/10/2014  . Diabetes mellitus, type 2 ([name]) 12/10/2014  . Chronic renal insufficiency 03/05/2014  . Status post nephrectomy 03/05/2014  . Chronic diastolic CHF (congestive heart failure) ([name]) 10/13/2013  . Pre-op evaluation 09/17/2013  . Permanent atrial fibrillation ([name]) 09/17/2013  . Long term current use of anticoagulant 10/25/2010  . Shortness of breath 11/01/2009  . Atherosclerosis of native coronary artery of native heart with stable angina pectoris ([name]) 10/22/2009  . HYPERTENSION, BENIGN 03/09/2009  . RBBB 12/29/2008  . EDEMA 12/29/2008    Past Medical History:  Diagnosis Date  . Atrial fibrillation (HCC)    coumadin therapy  . Coronary artery disease    native vessel  . Edema   . Hypertension   . RBBB (right bundle branch block)    . Squamous cell carcinoma    left hand & right ear     Social History   Social History  . Marital status: Married    Spouse name: [name]  . Number of children: 3  . Years of education: [name]   Occupational History  . 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 Smoking status: Former Smoker    Quit date: 04/24/1975  . Smokeless tobacco: Never Used  . Alcohol use 4.2 - 6.6 oz/week    4 - 8 Standard drinks or equivalent, 3 Shots of liquor per week     Comment: afew liquor drinks a day  . Drug use: No  . Sexual activity: Not on file   Other Topics Concern  . Not on file   Social History Narrative   He lives with his wife.          Outpatient Encounter Prescriptions as of 03/20/2017  Medication Sig Note  . Ascorbic Acid (VITAMIN C) 500 MG tablet Take 500 mg by mouth daily.     . cloNIDine (CATAPRES) 0.2 MG tablet TAKE 1 TABLET (0.2 MG TOTAL) BY MOUTH 2 (TWO) TIMES DAILY.   Marland Kitchen glucosamine-chondroitin 500-400 MG tablet Take 1 tablet by mouth daily.     . hydrALAZINE (APRESOLINE) 50 MG tablet Take 1 tablet (50 mg total) by mouth 3 (three) times daily as needed. 03/20/2017: Patient has been taking 1 pill daily  at 2 pm per the written instructions from Dr. Rockey Gross  . isosorbide mononitrate (IMDUR) 60 MG 24 hr tablet Take 1 tablet (60 mg total) by mouth daily.   Marland Kitchen loratadine (CLARITIN) 10 MG tablet TAKE 1 TABLET BY MOUTH DAILY AS NEEDED   . metoprolol succinate (TOPROL-XL) 25 MG 24 hr tablet Take 1 tablet (25 mg total) by mouth daily. Take with or immediately following a meal.   . Multiple Vitamin (MULTIVITAMIN) tablet Take 1 tablet by mouth daily.     . ONE TOUCH ULTRA TEST test strip USE TO TEST BLOOD SUGAR ONCE DAILY AS DIRECTED   . quinapril (ACCUPRIL) 40 MG tablet TAKE 1 TABLET (40 MG TOTAL) BY MOUTH 2 (TWO) TIMES DAILY.   . simvastatin (ZOCOR) 10 MG tablet TAKE ONE TABLET BY MOUTH DAILY AT BEDTIME   . Tamsulosin HCl (FLOMAX) 0.4 MG CAPS Take 0.4 mg by mouth  daily.     Marland Kitchen torsemide (DEMADEX) 20 MG tablet Take 40 mg by mouth 2 (two) times daily. PRN   . traZODone (DESYREL) 100 MG tablet Take 1 tablet (100 mg total) by mouth at bedtime.   Marland Kitchen warfarin (COUMADIN) 5 MG tablet TAKE AS DIRECTED BY COUMADIN CLINIC    No facility-administered encounter medications on file as of 03/20/2017.     Allergies  Allergen Reactions  . Pollen Extract     Review of Systems  Constitutional: Negative for fever and malaise/fatigue.  Eyes: Negative.   Respiratory: Negative for cough, shortness of breath and wheezing.   Cardiovascular: Negative for chest pain, palpitations, orthopnea, claudication and leg swelling.  Gastrointestinal: Negative.   Genitourinary: Negative.   Skin: Negative.   Neurological: Negative for dizziness, weakness and headaches.  Endo/Heme/Allergies: Negative.   Psychiatric/Behavioral: Negative.     Objective:  BP 140/68 (BP Location: Right Arm, Patient Position: Sitting, Cuff Size: Normal)   Pulse 60   Temp 97.9 F (36.6 C) (Oral)   Resp 16   Wt 178 lb (80.7 kg)   BMI 27.88 kg/m   Physical Exam  Constitutional: He is oriented to person, place, and time and well-developed, well-nourished, and in no distress.  HENT:  Head: Normocephalic and atraumatic.  Eyes: Pupils are equal, round, and reactive to light. Conjunctivae are normal.  Neck: Normal range of motion.  Cardiovascular: Normal rate, regular rhythm and normal heart sounds.   Pulmonary/Chest: Effort normal and breath sounds normal.  Neurological: He is alert and oriented to person, place, and time. Gait normal. GCS score is 15.  Skin: Skin is warm and dry.  Psychiatric: Mood, memory, affect and judgment normal.   SUBJECTIVE: 83 y.o. male for follow up of diabetes. Diabetic Review of Systems - .  Other symptoms and concerns:.  Current Outpatient Prescriptions  Medication Sig Dispense Refill  . Ascorbic Acid (VITAMIN C) 500 MG tablet Take 500 mg by mouth daily.      .  cloNIDine (CATAPRES) 0.2 MG tablet TAKE 1 TABLET (0.2 MG TOTAL) BY MOUTH 2 (TWO) TIMES DAILY. 180 tablet 3  . glucosamine-chondroitin 500-400 MG tablet Take 1 tablet by mouth daily.      . hydrALAZINE (APRESOLINE) 50 MG tablet Take 1 tablet (50 mg total) by mouth 3 (three) times daily as needed. (Patient taking differently: Take 50 mg by mouth daily. Per Dr Donivan Scull notes he is to take 50 mg at 2 pm with his Torsemide.) 90 tablet 3  . isosorbide mononitrate (IMDUR) 60 MG 24 hr tablet Take 1 tablet (60 mg total)  by mouth daily. 30 tablet 11  . loratadine (CLARITIN) 10 MG tablet TAKE 1 TABLET BY MOUTH DAILY AS NEEDED 30 tablet 12  . metoprolol succinate (TOPROL-XL) 25 MG 24 hr tablet Take 1 tablet (25 mg total) by mouth daily. Take with or immediately following a meal. 90 tablet 4  . Multiple Vitamin (MULTIVITAMIN) tablet Take 1 tablet by mouth daily.      . ONE TOUCH ULTRA TEST test strip USE TO TEST BLOOD SUGAR ONCE DAILY AS DIRECTED 100 each 11  . quinapril (ACCUPRIL) 40 MG tablet TAKE 1 TABLET (40 MG TOTAL) BY MOUTH 2 (TWO) TIMES DAILY. 180 tablet 3  . simvastatin (ZOCOR) 10 MG tablet TAKE ONE TABLET BY MOUTH DAILY AT BEDTIME 90 tablet 3  . Tamsulosin HCl (FLOMAX) 0.4 MG CAPS Take 0.4 mg by mouth daily.      Marland Kitchen torsemide (DEMADEX) 20 MG tablet Take 40 mg by mouth 2 (two) times daily. PRN    . traZODone (DESYREL) 100 MG tablet Take 1 tablet (100 mg total) by mouth at bedtime. 90 tablet 3  . warfarin (COUMADIN) 5 MG tablet TAKE AS DIRECTED BY COUMADIN CLINIC 60 tablet 1   No current facility-administered medications for this visit.     BP 140/68 (BP Location: Right Arm, Patient Position: Sitting, Cuff Size: Normal)   Pulse 60   Temp 97.9 F (36.6 C) (Oral)   Resp 16   Wt 178 lb (80.7 kg)   BMI 27.88 kg/m   Diabetic Foot Exam - Simple   Simple Foot Form Diabetic Foot exam was performed with the following findings:  Yes 03/20/2017 12:09 PM  Visual Inspection No deformities, no ulcerations,  no other skin breakdown bilaterally:  Yes Sensation Testing Intact to touch and monofilament testing bilaterally:  Yes Pulse Check Posterior Tibialis and Dorsalis pulse intact bilaterally:  Yes Comments     Assessment and Plan :  1. Type 2 diabetes mellitus with other circulatory complication, unspecified whether long term insulin use (HCC)  - POCT UA - Microalbumin  2. Essential hypertension  - CBC with Differential/Platelet - Comprehensive metabolic panel - TSH  3. Atherosclerosis of native coronary artery of native heart with stable angina pectoris (HCC)  - Lipid Panel With LDL/HDL Ratio   I have done the exam and reviewed the chart and it is accurate to the best of my knowledge. Development worker, community has been used and  any errors in dictation or transcription are unintentional. Miguel Aschoff M.D. [facility]

## 2017-03-21 DIAGNOSIS — I10 Essential (primary) hypertension: Secondary | ICD-10-CM | POA: Diagnosis not present

## 2017-03-21 DIAGNOSIS — I25118 Atherosclerotic heart disease of native coronary artery with other forms of angina pectoris: Secondary | ICD-10-CM | POA: Diagnosis not present

## 2017-03-22 LAB — LIPID PANEL WITH LDL/HDL RATIO
CHOLESTEROL TOTAL: 114 mg/dL (ref 100–199)
HDL: 73 mg/dL (ref >39)
LDL Calculated: 33 mg/dL (ref 0–99)
LDl/HDL Ratio: 0.5 ratio (ref 0.0–3.6)
Triglycerides: 41 mg/dL (ref 0–149)
VLDL CHOLESTEROL CAL: 8 mg/dL (ref 5–40)

## 2017-03-22 LAB — COMPREHENSIVE METABOLIC PANEL
A/G RATIO: 1.9 (ref 1.2–2.2)
ALBUMIN: 4 g/dL (ref 3.5–4.7)
ALT: 27 IU/L (ref 0–44)
AST: 29 IU/L (ref 0–40)
Alkaline Phosphatase: 78 IU/L (ref 39–117)
BILIRUBIN TOTAL: 0.6 mg/dL (ref 0.0–1.2)
BUN / CREAT RATIO: 17 (ref 10–24)
BUN: 27 mg/dL (ref 8–27)
CO2: 26 mmol/L (ref 20–29)
Calcium: 9 mg/dL (ref 8.6–10.2)
Chloride: 104 mmol/L (ref 96–106)
Creatinine, Ser: 1.56 mg/dL — ABNORMAL HIGH (ref 0.76–1.27)
GFR calc Af Amer: 46 mL/min/{1.73_m2} — ABNORMAL LOW (ref >59)
GFR calc non Af Amer: 40 mL/min/{1.73_m2} — ABNORMAL LOW (ref >59)
GLOBULIN, TOTAL: 2.1 g/dL (ref 1.5–4.5)
Glucose: 107 mg/dL — ABNORMAL HIGH (ref 65–99)
POTASSIUM: 4.2 mmol/L (ref 3.5–5.2)
SODIUM: 145 mmol/L — AB (ref 134–144)
Total Protein: 6.1 g/dL (ref 6.0–8.5)

## 2017-03-22 LAB — CBC WITH DIFFERENTIAL/PLATELET
BASOS: 0 %
Basophils Absolute: 0 10*3/uL (ref 0.0–0.2)
EOS (ABSOLUTE): 0.1 10*3/uL (ref 0.0–0.4)
EOS: 1 %
HEMATOCRIT: 40.3 % (ref 37.5–51.0)
HEMOGLOBIN: 13.3 g/dL (ref 13.0–17.7)
Immature Grans (Abs): 0 10*3/uL (ref 0.0–0.1)
Immature Granulocytes: 0 %
LYMPHS ABS: 0.9 10*3/uL (ref 0.7–3.1)
Lymphs: 16 %
MCH: 29 pg (ref 26.6–33.0)
MCHC: 33 g/dL (ref 31.5–35.7)
MCV: 88 fL (ref 79–97)
MONOS ABS: 0.4 10*3/uL (ref 0.1–0.9)
Monocytes: 7 %
NEUTROS ABS: 4 10*3/uL (ref 1.4–7.0)
Neutrophils: 76 %
Platelets: 161 10*3/uL (ref 150–379)
RBC: 4.58 x10E6/uL (ref 4.14–5.80)
RDW: 14 % (ref 12.3–15.4)
WBC: 5.3 10*3/uL (ref 3.4–10.8)

## 2017-03-22 LAB — TSH: TSH: 2.38 u[IU]/mL (ref 0.450–4.500)

## 2017-03-26 NOTE — Progress Notes (Signed)
Advised  ED 

## 2017-03-28 ENCOUNTER — Ambulatory Visit (INDEPENDENT_AMBULATORY_CARE_PROVIDER_SITE_OTHER): Payer: PPO | Admitting: *Deleted

## 2017-03-28 ENCOUNTER — Other Ambulatory Visit: Payer: Self-pay | Admitting: Family Medicine

## 2017-03-28 DIAGNOSIS — Z7901 Long term (current) use of anticoagulants: Secondary | ICD-10-CM | POA: Diagnosis not present

## 2017-03-28 DIAGNOSIS — I4891 Unspecified atrial fibrillation: Secondary | ICD-10-CM

## 2017-03-28 LAB — POCT INR: INR: 2.2

## 2017-04-16 DIAGNOSIS — L821 Other seborrheic keratosis: Secondary | ICD-10-CM | POA: Diagnosis not present

## 2017-04-16 DIAGNOSIS — L570 Actinic keratosis: Secondary | ICD-10-CM | POA: Diagnosis not present

## 2017-04-16 DIAGNOSIS — Z08 Encounter for follow-up examination after completed treatment for malignant neoplasm: Secondary | ICD-10-CM | POA: Diagnosis not present

## 2017-04-16 DIAGNOSIS — Z85828 Personal history of other malignant neoplasm of skin: Secondary | ICD-10-CM | POA: Diagnosis not present

## 2017-04-16 DIAGNOSIS — D0359 Melanoma in situ of other part of trunk: Secondary | ICD-10-CM | POA: Diagnosis not present

## 2017-04-16 DIAGNOSIS — X32XXXA Exposure to sunlight, initial encounter: Secondary | ICD-10-CM | POA: Diagnosis not present

## 2017-04-16 DIAGNOSIS — D485 Neoplasm of uncertain behavior of skin: Secondary | ICD-10-CM | POA: Diagnosis not present

## 2017-04-16 DIAGNOSIS — Z85820 Personal history of malignant melanoma of skin: Secondary | ICD-10-CM | POA: Diagnosis not present

## 2017-04-23 DIAGNOSIS — L905 Scar conditions and fibrosis of skin: Secondary | ICD-10-CM | POA: Diagnosis not present

## 2017-04-23 DIAGNOSIS — D0359 Melanoma in situ of other part of trunk: Secondary | ICD-10-CM | POA: Diagnosis not present

## 2017-05-07 ENCOUNTER — Ambulatory Visit (INDEPENDENT_AMBULATORY_CARE_PROVIDER_SITE_OTHER): Payer: PPO

## 2017-05-07 DIAGNOSIS — I4891 Unspecified atrial fibrillation: Secondary | ICD-10-CM | POA: Diagnosis not present

## 2017-05-07 DIAGNOSIS — Z7901 Long term (current) use of anticoagulants: Secondary | ICD-10-CM

## 2017-05-07 LAB — POCT INR: INR: 2.2

## 2017-05-07 NOTE — Telephone Encounter (Signed)
Appt scheduled

## 2017-05-11 ENCOUNTER — Other Ambulatory Visit: Payer: Self-pay | Admitting: Cardiovascular Disease
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
## 2017-05-22 ENCOUNTER — Ambulatory Visit (INDEPENDENT_AMBULATORY_CARE_PROVIDER_SITE_OTHER): Payer: PPO | Admitting: Cardiovascular Disease

## 2017-05-22 ENCOUNTER — Encounter: Payer: Self-pay | Admitting: Cardiovascular Disease

## 2017-05-22 ENCOUNTER — Ambulatory Visit: Payer: PPO

## 2017-05-22 VITALS — BP 136/62 | HR 53 | Ht 66.0 in | Wt 176.0 lb

## 2017-05-22 DIAGNOSIS — I25118 Atherosclerotic heart disease of native coronary artery with other forms of angina pectoris: Secondary | ICD-10-CM | POA: Diagnosis not present

## 2017-05-22 DIAGNOSIS — R0602 Shortness of breath: Secondary | ICD-10-CM | POA: Diagnosis not present

## 2017-05-22 DIAGNOSIS — I4821 Permanent atrial fibrillation: Secondary | ICD-10-CM

## 2017-05-22 DIAGNOSIS — E1159 Type 2 diabetes mellitus with other circulatory complications: Secondary | ICD-10-CM

## 2017-05-22 DIAGNOSIS — E782 Mixed hyperlipidemia: Secondary | ICD-10-CM

## 2017-05-22 DIAGNOSIS — I482 Chronic atrial fibrillation: Secondary | ICD-10-CM | POA: Diagnosis not present

## 2017-05-22 DIAGNOSIS — I5032 Chronic diastolic (congestive) heart failure: Secondary | ICD-10-CM | POA: Diagnosis not present

## 2017-05-22 DIAGNOSIS — I10 Essential (primary) hypertension: Secondary | ICD-10-CM | POA: Diagnosis not present

## 2017-05-22 DIAGNOSIS — Z23 Encounter for immunization: Secondary | ICD-10-CM | POA: Diagnosis not present

## 2017-05-22 NOTE — Patient Instructions (Signed)

## 2017-05-28 ENCOUNTER — Ambulatory Visit (INDEPENDENT_AMBULATORY_CARE_PROVIDER_SITE_OTHER): Payer: PPO

## 2017-05-28 VITALS — BP 172/64 | HR 68 | Temp 97.9°F | Ht 66.0 in | Wt 176.2 lb

## 2017-05-28 DIAGNOSIS — Z0000 Encounter for general adult medical examination without abnormal findings: Secondary | ICD-10-CM

## 2017-05-28 NOTE — Patient Instructions (Signed)
[name] , Thank you for taking time to come for your Medicare Wellness Visit. I appreciate your ongoing commitment to your health goals. Please review the following plan we discussed and let me know if I can assist you in the future.   Screening recommendations/referrals: Colonoscopy: no longer required Recommended yearly ophthalmology/optometry visit for glaucoma screening and checkup Recommended yearly dental visit for hygiene and checkup  Vaccinations: Influenza vaccine: completed Pneumococcal vaccine: completed series Tdap vaccine: Up to date Shingles vaccine: declined  Advanced directives: Already on file  Conditions/risks identified: Recommend increasing water intake to 6-8 glasses of water a day.   Next appointment: 07/03/17 @ 10:00 AM  Preventive Care 65 Years and Older, Male Preventive care refers to lifestyle choices and visits with your health care provider that can promote health and wellness. What does preventive care include?  A yearly physical exam. This is also called an annual well check.  Dental exams once or twice a year.  Routine eye exams. Ask your health care provider how often you should have your eyes checked.  Personal lifestyle choices, including:  Daily care of your teeth and gums.  Regular physical activity.  Eating a healthy diet.  Avoiding tobacco and drug use.  Limiting alcohol use.  Practicing safe sex.  Taking low doses of aspirin every day.  Taking vitamin and mineral supplements as recommended by your health care provider. What happens during an annual well check? The services and screenings done by your health care provider during your annual well check will depend on your age, overall health, lifestyle risk factors, and family history of disease. Counseling  Your health care provider may ask you questions about your:  Alcohol use.  Tobacco use.  Drug use.  Emotional well-being.  Home and relationship  well-being.  Sexual activity.  Eating habits.  History of falls.  Memory and ability to understand (cognition).  Work and work Statistician. Screening  You may have the following tests or measurements:  Height, weight, and BMI.  Blood pressure.  Lipid and cholesterol levels. These may be checked every 5 years, or more frequently if you are over 27 years old.  Skin check.  Lung cancer screening. You may have this screening every year starting at age 36 if you have a 30-pack-year history of smoking and currently smoke or have quit within the past 15 years.  Fecal occult blood test (FOBT) of the stool. You may have this test every year starting at age 59.  Flexible sigmoidoscopy or colonoscopy. You may have a sigmoidoscopy every 5 years or a colonoscopy every 10 years starting at age 25.  Prostate cancer screening. Recommendations will vary depending on your family history and other risks.  Hepatitis C blood test.  Hepatitis B blood test.  Sexually transmitted disease (STD) testing.  Diabetes screening. This is done by checking your blood sugar (glucose) after you have not eaten for a while (fasting). You may have this done every 1-3 years.  Abdominal aortic aneurysm (AAA) screening. You may need this if you are a current or former smoker.  Osteoporosis. You may be screened starting at age 20 if you are at high risk. Talk with your health care provider about your test results, treatment options, and if necessary, the need for more tests. Vaccines  Your health care provider may recommend certain vaccines, such as:  Influenza vaccine. This is recommended every year.  Tetanus, diphtheria, and acellular pertussis (Tdap, Td) vaccine. You may need a Td booster every 10  years.  Zoster vaccine. You may need this after age 32.  Pneumococcal 13-valent conjugate (PCV13) vaccine. One dose is recommended after age 27.  Pneumococcal polysaccharide (PPSV23) vaccine. One dose is  recommended after age 67. Talk to your health care provider about which screenings and vaccines you need and how often you need them. This information is not intended to replace advice given to you by your health care provider. Make sure you discuss any questions you have with your health care provider. Document Released: 08/20/2015 Document Revised: 04/12/2016 Document Reviewed: 05/25/2015 Elsevier Interactive Patient Education  2017 [place] Prevention in the Home Falls can cause injuries. They can happen to people of all ages. There are many things you can do to make your home safe and to help prevent falls. What can I do on the outside of my home?  Regularly fix the edges of walkways and driveways and fix any cracks.  Remove anything that might make you trip as you walk through a door, such as a raised step or threshold.  Trim any bushes or trees on the path to your home.  Use bright outdoor lighting.  Clear any walking paths of anything that might make someone trip, such as rocks or tools.  Regularly check to see if handrails are loose or broken. Make sure that both sides of any steps have handrails.  Any raised decks and porches should have guardrails on the edges.  Have any leaves, snow, or ice cleared regularly.  Use sand or salt on walking paths during winter.  Clean up any spills in your garage right away. This includes oil or grease spills. What can I do in the bathroom?  Use night lights.  Install grab bars by the toilet and in the tub and shower. Do not use towel bars as grab bars.  Use non-skid mats or decals in the tub or shower.  If you need to sit down in the shower, use a plastic, non-slip stool.  Keep the floor dry. Clean up any water that spills on the floor as soon as it happens.  Remove soap buildup in the tub or shower regularly.  Attach bath mats securely with double-sided non-slip rug tape.  Do not have throw rugs and other things on  the floor that can make you trip. What can I do in the bedroom?  Use night lights.  Make sure that you have a light by your bed that is easy to reach.  Do not use any sheets or blankets that are too big for your bed. They should not hang down onto the floor.  Have a firm chair that has side arms. You can use this for support while you get dressed.  Do not have throw rugs and other things on the floor that can make you trip. What can I do in the kitchen?  Clean up any spills right away.  Avoid walking on wet floors.  Keep items that you use a lot in easy-to-reach places.  If you need to reach something above you, use a strong step stool that has a grab bar.  Keep electrical cords out of the way.  Do not use floor polish or wax that makes floors slippery. If you must use wax, use non-skid floor wax.  Do not have throw rugs and other things on the floor that can make you trip. What can I do with my stairs?  Do not leave any items on the stairs.  Make sure that  there are handrails on both sides of the stairs and use them. Fix handrails that are broken or loose. Make sure that handrails are as long as the stairways.  Check any carpeting to make sure that it is firmly attached to the stairs. Fix any carpet that is loose or worn.  Avoid having throw rugs at the top or bottom of the stairs. If you do have throw rugs, attach them to the floor with carpet tape.  Make sure that you have a light switch at the top of the stairs and the bottom of the stairs. If you do not have them, ask someone to add them for you. What else can I do to help prevent falls?  Wear shoes that:  Do not have high heels.  Have rubber bottoms.  Are comfortable and fit you well.  Are closed at the toe. Do not wear sandals.  If you use a stepladder:  Make sure that it is fully opened. Do not climb a closed stepladder.  Make sure that both sides of the stepladder are locked into place.  Ask someone to  hold it for you, if possible.  Clearly mark and make sure that you can see:  Any grab bars or handrails.  First and last steps.  Where the edge of each step is.  Use tools that help you move around (mobility aids) if they are needed. These include:  Canes.  Walkers.  Scooters.  Crutches.  Turn on the lights when you go into a dark area. Replace any light bulbs as soon as they burn out.  Set up your furniture so you have a clear path. Avoid moving your furniture around.  If any of your floors are uneven, fix them.  If there are any pets around you, be aware of where they are.  Review your medicines with your doctor. Some medicines can make you feel dizzy. This can increase your chance of falling. Ask your doctor what other things that you can do to help prevent falls. This information is not intended to replace advice given to you by your health care provider. Make sure you discuss any questions you have with your health care provider. Document Released: 05/20/2009 Document Revised: 12/30/2015 Document Reviewed: 08/28/2014 Elsevier Interactive Patient Education  2017 [employer].

## 2017-05-28 NOTE — Progress Notes (Signed)
Subjective:   [name] is a 83 y.o. male who presents for Medicare Annual/Subsequent preventive examination.  Review of Systems:  N/A  Cardiac Risk Factors include: advanced age (>63men, >83 women);diabetes mellitus;dyslipidemia;hypertension;male gender;sedentary lifestyle     Objective:    Vitals: BP (!) 172/64 (BP Location: Left Arm)   Pulse 68   Temp 97.9 F (36.6 C) (Oral)   Ht 5\' 6"  (1.676 m)   Wt 176 lb 3.2 oz (79.9 kg)   BMI 28.44 kg/m   Body mass index is 28.44 kg/m.  Tobacco History  Smoking Status  . Former Smoker  . Quit date: 04/24/1975  Smokeless Tobacco  . Never Used     Counseling given: Not Answered   Past Medical History:  Diagnosis Date  . Atrial fibrillation ([facility])    coumadin therapy  . Coronary artery disease    native vessel  . Edema   . Hypertension   . RBBB (right bundle branch block)   . Squamous cell carcinoma    left hand & right ear    Past Surgical History:  Procedure Laterality Date  . APPENDECTOMY    . CARDIOVERSION  09/21/08  . CORONARY ARTERY BYPASS GRAFT    . HERNIA REPAIR    . MELANOMA EXCISION     back  . NEPHRECTOMY    . PROSTATE ABLATION     transurethral needle ablation  . SQUAMOUS CELL CARCINOMA EXCISION     left hand & right ear.    Family History  Problem Relation Age of Onset  . Heart attack Father   . Arthritis Mother   . Other Neg Hx        Gastrointestinal Malignancy   History  Sexual Activity  . Sexual activity: Not on file    Outpatient Encounter Prescriptions as of 05/28/2017  Medication Sig  . Ascorbic Acid (VITAMIN C) 500 MG tablet Take 500 mg by mouth daily.    . cloNIDine (CATAPRES) 0.2 MG tablet TAKE 1 TABLET (0.2 MG TOTAL) BY MOUTH 2 (TWO) TIMES DAILY.  Marland Kitchen glucosamine-chondroitin 500-400 MG tablet Take 1 tablet by mouth daily.    . hydrALAZINE (APRESOLINE) 50 MG tablet Take 1 tablet (50 mg total) by mouth 3 (three) times daily as needed. (Patient taking differently: Take 50 mg by  mouth daily. Per Dr Donivan Scull notes he is to take 50 mg at 2 pm with his Torsemide.)  . isosorbide mononitrate (IMDUR) 60 MG 24 hr tablet Take 1 tablet (60 mg total) by mouth daily.  Marland Kitchen loratadine (CLARITIN) 10 MG tablet TAKE 1 TABLET BY MOUTH DAILY AS NEEDED  . metoprolol succinate (TOPROL-XL) 25 MG 24 hr tablet Take 1 tablet (25 mg total) by mouth daily. Take with or immediately following a meal.  . Multiple Vitamin (MULTIVITAMIN) tablet Take 1 tablet by mouth daily.    . ONE TOUCH ULTRA TEST test strip USE TO TEST BLOOD SUGAR ONCE DAILY AS DIRECTED  . quinapril (ACCUPRIL) 40 MG tablet TAKE 1 TABLET (40 MG TOTAL) BY MOUTH 2 (TWO) TIMES DAILY.  . simvastatin (ZOCOR) 10 MG tablet TAKE ONE TABLET BY MOUTH DAILY AT BEDTIME  . Tamsulosin HCl (FLOMAX) 0.4 MG CAPS Take 0.4 mg by mouth daily.    Marland Kitchen torsemide (DEMADEX) 20 MG tablet Take 40 mg by mouth 2 (two) times daily. PRN  . traZODone (DESYREL) 100 MG tablet Take 1 tablet (100 mg total) by mouth at bedtime.  Marland Kitchen warfarin (COUMADIN) 5 MG tablet TAKE AS DIRECTED BY  COUMADIN CLINIC   No facility-administered encounter medications on file as of 05/28/2017.     Activities of Daily Living In your present state of health, do you have any difficulty performing the following activities: 05/28/2017 01/15/2017  Hearing? N N  Vision? N N  Difficulty concentrating or making decisions? N N  Walking or climbing stairs? Y Y  Comment due to a-fib, CHF and SOB -  Dressing or bathing? N N  Doing errands, shopping? N N  Preparing Food and eating ? N -  Using the Toilet? N -  In the past six months, have you accidently leaked urine? N -  Do you have problems with loss of bowel control? N -  Managing your Medications? N -  Managing your Finances? N -  Housekeeping or managing your Housekeeping? N -  Some recent data might be hidden    Patient Care Team: Jerrol Banana., MD as PCP - General (Unknown Physician Specialty) Minna Merritts, MD as  Consulting Physician (Cardiology) Thelma Comp, [place] as Consulting Physician (Optometry) Lavonia Dana, MD as Consulting Physician (Internal Medicine) Royston Cowper, MD as Consulting Physician (Urology)   Assessment:     Exercise Activities and Dietary recommendations Current Exercise Habits: Home exercise routine, Type of exercise: walking, Time (Minutes): 30, Frequency (Times/Week): 3 (to 4 days weekly), Weekly Exercise (Minutes/Week): 90, Intensity: Mild, Exercise limited by: respiratory conditions(s)  Goals    . Increase water intake          Recommend increasing water intake to 6-8 glasses of water a day.       Fall Risk Fall Risk  05/28/2017 03/20/2017 07/27/2015  Falls in the past year? No No Yes  Number falls in past yr: - - 1  Injury with Fall? - - Yes   Depression Screen PHQ 2/9 Scores 05/28/2017 03/20/2017 07/27/2015  PHQ - 2 Score 0 0 0  PHQ- 9 Score - 4 -    Cognitive Function: Pt declined screening today.         Immunization History  Administered Date(s) Administered  . Influenza, High Dose Seasonal PF 05/27/2015, 05/11/2016  . Influenza,inj,Quad PF,6+ Mos 05/22/2017  . Pneumococcal Conjugate-13 05/12/2014  . Pneumococcal Polysaccharide-23 06/07/1996  . Tdap 04/03/2011   Screening Tests Health Maintenance  Topic Date Due  . HEMOGLOBIN A1C  07/17/2017  . OPHTHALMOLOGY EXAM  09/20/2017  . FOOT EXAM  03/20/2018  . TETANUS/TDAP  04/02/2021  . INFLUENZA VACCINE  Completed  . PNA vac Low Risk Adult  Completed      Plan:  I have personally reviewed and addressed the Medicare Annual Wellness questionnaire and have noted the following in the patient's chart:  A. Medical and social history B. Use of alcohol, tobacco or illicit drugs  C. Current medications and supplements D. Functional ability and status E.  Nutritional status F.  Physical activity G. Advance directives H. List of other physicians I.  Hospitalizations, surgeries, and ER visits  in previous 12 months J.  [place] such as hearing and vision if needed, cognitive and depression L. Referrals and appointments - none  In addition, I have reviewed and discussed with patient certain preventive protocols, quality metrics, and best practice recommendations. A written personalized care plan for preventive services as well as general preventive health recommendations were provided to patient.  See attached scanned questionnaire for additional information.   Signed,  Fabio Neighbors, LPN Nurse Health Advisor   MD Recommendations: None.  Pts BP was  elevated today. Pt states he checks his BP twice daily at home (advised by cardiologist because CHF). Pt states his BP does not normally run that high. Advised pt to keep watch on this and to call or return if BP readings start running this high at home. Pt stated understanding.

## 2017-06-01 DIAGNOSIS — E875 Hyperkalemia: Secondary | ICD-10-CM | POA: Diagnosis not present

## 2017-06-01 DIAGNOSIS — I509 Heart failure, unspecified: Secondary | ICD-10-CM | POA: Diagnosis not present

## 2017-06-01 DIAGNOSIS — D631 Anemia in chronic kidney disease: Secondary | ICD-10-CM | POA: Diagnosis not present

## 2017-06-01 DIAGNOSIS — I10 Essential (primary) hypertension: Secondary | ICD-10-CM | POA: Diagnosis not present

## 2017-06-01 DIAGNOSIS — N183 Chronic kidney disease, stage 3 (moderate): Secondary | ICD-10-CM | POA: Diagnosis not present

## 2017-06-01 DIAGNOSIS — N2581 Secondary hyperparathyroidism of renal origin: Secondary | ICD-10-CM | POA: Diagnosis not present

## 2017-06-01 DIAGNOSIS — I129 Hypertensive chronic kidney disease with stage 1 through stage 4 chronic kidney disease, or unspecified chronic kidney disease: Secondary | ICD-10-CM | POA: Diagnosis not present

## 2017-06-05 ENCOUNTER — Encounter: Payer: PPO | Admitting: Family Medicine

## 2017-06-05 DIAGNOSIS — N183 Chronic kidney disease, stage 3 (moderate): Secondary | ICD-10-CM | POA: Diagnosis not present

## 2017-06-05 DIAGNOSIS — I129 Hypertensive chronic kidney disease with stage 1 through stage 4 chronic kidney disease, or unspecified chronic kidney disease: Secondary | ICD-10-CM | POA: Diagnosis not present

## 2017-06-05 DIAGNOSIS — N2581 Secondary hyperparathyroidism of renal origin: Secondary | ICD-10-CM | POA: Diagnosis not present

## 2017-06-05 DIAGNOSIS — R609 Edema, unspecified: Secondary | ICD-10-CM | POA: Diagnosis not present

## 2017-06-05 DIAGNOSIS — R809 Proteinuria, unspecified: Secondary | ICD-10-CM | POA: Diagnosis not present

## 2017-06-07 ENCOUNTER — Encounter: Payer: PPO | Admitting: Family Medicine

## 2017-06-18 ENCOUNTER — Ambulatory Visit (INDEPENDENT_AMBULATORY_CARE_PROVIDER_SITE_OTHER): Payer: PPO

## 2017-06-18 DIAGNOSIS — I4891 Unspecified atrial fibrillation: Secondary | ICD-10-CM

## 2017-06-18 DIAGNOSIS — Z7901 Long term (current) use of anticoagulants: Secondary | ICD-10-CM

## 2017-06-18 LAB — POCT INR: INR: 2.7

## 2017-07-01 ENCOUNTER — Other Ambulatory Visit: Payer: Self-pay | Admitting: Family Medicine

## 2017-07-03 ENCOUNTER — Ambulatory Visit (INDEPENDENT_AMBULATORY_CARE_PROVIDER_SITE_OTHER): Payer: PPO | Admitting: Family Medicine

## 2017-07-03 VITALS — BP 164/60 | HR 66 | Temp 98.2°F | Resp 16 | Wt 178.2 lb

## 2017-07-03 DIAGNOSIS — E1159 Type 2 diabetes mellitus with other circulatory complications: Secondary | ICD-10-CM | POA: Diagnosis not present

## 2017-07-03 DIAGNOSIS — I25709 Atherosclerosis of coronary artery bypass graft(s), unspecified, with unspecified angina pectoris: Secondary | ICD-10-CM | POA: Diagnosis not present

## 2017-07-03 DIAGNOSIS — Z0000 Encounter for general adult medical examination without abnormal findings: Secondary | ICD-10-CM | POA: Diagnosis not present

## 2017-07-03 LAB — POCT GLYCOSYLATED HEMOGLOBIN (HGB A1C): Hemoglobin A1C: 5.7

## 2017-07-03 NOTE — Progress Notes (Signed)
Patient: [name], Male    DOB: 1927-11-20, 83 y.o.   MRN: [id number] Visit Date: 07/03/2017  Today's Provider: Wilhemena Durie, MD   Chief Complaint  Patient presents with  . Annual Exam   Subjective:  [name] is a 83 y.o. male who presents today for health maintenance and complete physical. He feels fairly well. He reports exercising walking, 30 minutes at a time for 3 to 4 days a week. He reports he is sleeping well.  Last colonoscopy was 06/06/06 with Dr Henrene Pastor, found diverticulosis, internal hemorrhoids. No polyps seen. Follow up prn.  Patient [facility] Visit on 05/28/17.  Last lab work was done routine on 03/21/17 and was stable.  Fall Risk  05/28/2017 03/20/2017 07/27/2015  Falls in the past year? No No Yes  Number falls in past yr: - - 1  Injury with Fall? - - Yes   PHQ 2/9 Scores 05/28/2017 03/20/2017 07/27/2015  PHQ - 2 Score 0 0 0  PHQ- 9 Score - 4 -   Review of Systems  Constitutional: Positive for activity change.  HENT: Negative.   Eyes: Negative.   Respiratory: Positive for shortness of breath.        Predictable DOE.  Cardiovascular: Positive for leg swelling.  Gastrointestinal: Negative.   Endocrine: Positive for cold intolerance and polyuria.  Musculoskeletal: Negative.   Skin: Negative.   Allergic/Immunologic: Negative.   Neurological: Positive for dizziness.  Hematological: Bruises/bleeds easily.  Psychiatric/Behavioral: Negative.     Social History   Socioeconomic History  . Marital status: Married    Spouse name: Not on file  . Number of children: 3  . Years of education: Not on file  . Highest education level: Not on file  Social Needs  . Financial resource strain: Not on file  . Food insecurity - worry: Not on file  . Food insecurity - inability: Not on file  . Transportation needs - medical: Not on file  . Transportation needs - non-medical: Not on file  Occupational History  . Occupation: Actuary: RETIRED  Tobacco Use  . Smoking status: Former Smoker    Last attempt to quit: 04/24/1975    Years since quitting: 42.2  . Smokeless tobacco: Never Used  Substance and Sexual Activity  . Alcohol use: Yes    Alcohol/week: 6.6 - 13.2 oz    Types: 4 - 8 Standard drinks or equivalent, 7 - 14 Shots of liquor per week    Comment: 1-2 liquor drinks a day  . Drug use: No  . Sexual activity: Not on file  Other Topics Concern  . Not on file  Social History Narrative   He lives with his wife.          Patient Active Problem List   Diagnosis Date Noted  . Actinic keratoses 12/10/2014  . Allergic rhinitis 12/10/2014  . A-fib ([place]) 12/10/2014  . CCF (congestive cardiac failure) ([place]) 12/10/2014  . Acid reflux 12/10/2014  . HLD (hyperlipidemia) 12/10/2014  . BP (high blood pressure) 12/10/2014  . Arthritis, degenerative 12/10/2014  . Diabetes mellitus, type 2 ([place]) 12/10/2014  . Chronic renal insufficiency 03/05/2014  . Status post nephrectomy 03/05/2014  . Chronic diastolic CHF (congestive heart failure) ([place]) 10/13/2013  . Pre-op evaluation 09/17/2013  . Permanent atrial fibrillation ([place]) 09/17/2013  . Long term current use of anticoagulant 10/25/2010  . Shortness of breath 11/01/2009  . Atherosclerosis of native coronary artery of native  heart with stable angina pectoris ([place]) 10/22/2009  . HYPERTENSION, BENIGN 03/09/2009  . RBBB 12/29/2008  . EDEMA 12/29/2008    Past Surgical History:  Procedure Laterality Date  . APPENDECTOMY    . CARDIOVERSION  09/21/08  . CORONARY ARTERY BYPASS GRAFT    . HERNIA REPAIR    . MELANOMA EXCISION     back  . NEPHRECTOMY    . PROSTATE ABLATION     transurethral needle ablation  . SQUAMOUS CELL CARCINOMA EXCISION     left hand & right ear.     His family history includes Arthritis in his mother; Heart attack in his father.     Outpatient Encounter Medications as of 07/03/2017  Medication Sig Note  . Ascorbic Acid  (VITAMIN C) 500 MG tablet Take 500 mg by mouth daily.     . cloNIDine (CATAPRES) 0.2 MG tablet TAKE 1 TABLET (0.2 MG TOTAL) BY MOUTH 2 (TWO) TIMES DAILY.   Marland Kitchen glucosamine-chondroitin 500-400 MG tablet Take 1 tablet by mouth daily.     . hydrALAZINE (APRESOLINE) 50 MG tablet Take 1 tablet (50 mg total) by mouth 3 (three) times daily as needed. (Patient taking differently: Take 50 mg by mouth daily. Per Dr Donivan Scull notes he is to take 50 mg at 2 pm with his Torsemide.) 03/20/2017: Patient has been taking 1 pill daily at 2 pm per the written instructions from Dr. [name]  . isosorbide mononitrate (IMDUR) 60 MG 24 hr tablet Take 1 tablet (60 mg total) by mouth daily.   Marland Kitchen loratadine (CLARITIN) 10 MG tablet TAKE 1 TABLET BY MOUTH DAILY AS NEEDED   . metoprolol succinate (TOPROL-XL) 25 MG 24 hr tablet Take 1 tablet (25 mg total) by mouth daily. Take with or immediately following a meal.   . Multiple Vitamin (MULTIVITAMIN) tablet Take 1 tablet by mouth daily.     . ONE TOUCH ULTRA TEST test strip USE TO TEST BLOOD SUGAR ONCE DAILY AS DIRECTED   . quinapril (ACCUPRIL) 40 MG tablet TAKE 1 TABLET (40 MG TOTAL) BY MOUTH 2 (TWO) TIMES DAILY.   . simvastatin (ZOCOR) 10 MG tablet TAKE ONE TABLET BY MOUTH DAILY AT BEDTIME   . Tamsulosin HCl (FLOMAX) 0.4 MG CAPS Take 0.4 mg by mouth daily.     Marland Kitchen torsemide (DEMADEX) 20 MG tablet Take 40 mg by mouth 2 (two) times daily. PRN   . traZODone (DESYREL) 100 MG tablet Take 1 tablet (100 mg total) by mouth at bedtime.   Marland Kitchen warfarin (COUMADIN) 5 MG tablet TAKE AS DIRECTED BY COUMADIN CLINIC    No facility-administered encounter medications on file as of 07/03/2017.     Patient Care Team: Jerrol Banana., MD as PCP - General (Unknown Physician Specialty) Minna Merritts, MD as Consulting Physician (Cardiology) Thelma Comp, Joaquin as Consulting Physician (Optometry) Lavonia Dana, MD as Consulting Physician (Internal Medicine) Royston Cowper, MD as Consulting  Physician (Urology)      Objective:   Vitals:  Vitals:   07/03/17 1028  BP: (!) 164/60  Pulse: 66  Resp: 16  Temp: 98.2 F (36.8 C)  Weight: 178 lb 3.2 oz (80.8 kg)    Physical Exam  Constitutional: He is oriented to person, place, and time. He appears well-developed and well-nourished.  HENT:  Head: Normocephalic and atraumatic.  Right Ear: External ear normal.  Left Ear: External ear normal.  Nose: Nose normal.  Mouth/Throat: Oropharynx is clear and moist.  Eyes: Conjunctivae are normal. No scleral  icterus.  Neck: No thyromegaly present.  Cardiovascular: Normal rate, regular rhythm, normal heart sounds and intact distal pulses.  Pulmonary/Chest: Effort normal and breath sounds normal.  Abdominal: Soft.  Musculoskeletal: He exhibits edema.  1+ edema.  Lymphadenopathy:    He has no cervical adenopathy.  Neurological: He is alert and oriented to person, place, and time.  Skin: Skin is warm and dry.  Psychiatric: He has a normal mood and affect. His behavior is normal. Judgment and thought content normal.     Assessment & Plan:   1. Annual physical exam   2. Type 2 diabetes mellitus with other circulatory complication, unspecified whether long term insulin use (HCC) 5.7. Better. - POCT HgB A1C 3.CAD Stable.  HPI, Exam and A&P transcribed by Tiffany Kocher, RMA under direction and in the presence of Miguel Aschoff, MD. I have done the exam and reviewed the chart and it is accurate to the best of my knowledge. Development worker, community has been used and  any errors in dictation or transcription are unintentional. Miguel Aschoff M.D. [facility]

## 2017-07-06 ENCOUNTER — Encounter: Payer: Self-pay | Admitting: Family Medicine

## 2017-07-08 ENCOUNTER — Other Ambulatory Visit: Payer: Self-pay | Admitting: Family Medicine

## 2017-07-08 DIAGNOSIS — G4709 Other insomnia: Secondary | ICD-10-CM

## 2017-08-01 ENCOUNTER — Ambulatory Visit (INDEPENDENT_AMBULATORY_CARE_PROVIDER_SITE_OTHER): Payer: PPO

## 2017-08-01 ENCOUNTER — Other Ambulatory Visit: Payer: Self-pay

## 2017-08-01 DIAGNOSIS — I4891 Unspecified atrial fibrillation: Secondary | ICD-10-CM

## 2017-08-01 DIAGNOSIS — Z7901 Long term (current) use of anticoagulants: Secondary | ICD-10-CM

## 2017-08-01 DIAGNOSIS — I4821 Permanent atrial fibrillation: Secondary | ICD-10-CM

## 2017-08-01 LAB — POCT INR: INR: 2.9

## 2017-08-01 NOTE — Patient Instructions (Signed)
Continue on same dosage 1/2 tablet daily except 1 tablet on Mondays.  Recheck in 6 weeks.

## 2017-08-28 DIAGNOSIS — L570 Actinic keratosis: Secondary | ICD-10-CM | POA: Diagnosis not present

## 2017-08-28 DIAGNOSIS — Z85820 Personal history of malignant melanoma of skin: Secondary | ICD-10-CM | POA: Diagnosis not present

## 2017-08-28 DIAGNOSIS — X32XXXA Exposure to sunlight, initial encounter: Secondary | ICD-10-CM | POA: Diagnosis not present

## 2017-08-28 DIAGNOSIS — Z85828 Personal history of other malignant neoplasm of skin: Secondary | ICD-10-CM | POA: Diagnosis not present

## 2017-08-28 DIAGNOSIS — D2261 Melanocytic nevi of right upper limb, including shoulder: Secondary | ICD-10-CM | POA: Diagnosis not present

## 2017-08-28 DIAGNOSIS — D225 Melanocytic nevi of trunk: Secondary | ICD-10-CM | POA: Diagnosis not present

## 2017-09-07 ENCOUNTER — Other Ambulatory Visit: Payer: Self-pay | Admitting: Cardiovascular Disease

## 2017-09-12 ENCOUNTER — Ambulatory Visit (INDEPENDENT_AMBULATORY_CARE_PROVIDER_SITE_OTHER): Payer: PPO

## 2017-09-12 DIAGNOSIS — Z7901 Long term (current) use of anticoagulants: Secondary | ICD-10-CM

## 2017-09-12 DIAGNOSIS — I4821 Permanent atrial fibrillation: Secondary | ICD-10-CM

## 2017-09-12 DIAGNOSIS — I482 Chronic atrial fibrillation: Secondary | ICD-10-CM | POA: Diagnosis not present

## 2017-09-12 LAB — POCT INR: INR: 2

## 2017-09-12 NOTE — Patient Instructions (Signed)
Continue on same dosage 1/2 tablet daily except 1 tablet on Mondays.  Recheck in 6 weeks.

## 2017-09-27 ENCOUNTER — Telehealth: Payer: Self-pay | Admitting: Family Medicine

## 2017-09-27 NOTE — Telephone Encounter (Signed)
Patient states someone from our # called him stating he needed an eye exam in March but not sure why this person is calling him. Do you know if someone from [facility] called or an outside service?

## 2017-09-27 NOTE — Telephone Encounter (Signed)
Patient was advised that it is time for him to have a diabetic eye exam.

## 2017-10-05 ENCOUNTER — Other Ambulatory Visit: Payer: Self-pay | Admitting: Cardiovascular Disease

## 2017-10-07 ENCOUNTER — Other Ambulatory Visit: Payer: Self-pay | Admitting: Cardiovascular Disease

## 2017-10-08 NOTE — Telephone Encounter (Signed)
Refill Request.  

## 2017-10-11 DIAGNOSIS — H2511 Age-related nuclear cataract, right eye: Secondary | ICD-10-CM | POA: Diagnosis not present

## 2017-10-11 DIAGNOSIS — Z9842 Cataract extraction status, left eye: Secondary | ICD-10-CM | POA: Diagnosis not present

## 2017-10-11 DIAGNOSIS — E119 Type 2 diabetes mellitus without complications: Secondary | ICD-10-CM | POA: Diagnosis not present

## 2017-10-11 DIAGNOSIS — H524 Presbyopia: Secondary | ICD-10-CM | POA: Diagnosis not present

## 2017-10-24 ENCOUNTER — Ambulatory Visit (INDEPENDENT_AMBULATORY_CARE_PROVIDER_SITE_OTHER): Payer: PPO

## 2017-10-24 DIAGNOSIS — Z5181 Encounter for therapeutic drug level monitoring: Secondary | ICD-10-CM | POA: Diagnosis not present

## 2017-10-24 DIAGNOSIS — Z7901 Long term (current) use of anticoagulants: Secondary | ICD-10-CM | POA: Diagnosis not present

## 2017-10-24 DIAGNOSIS — I4821 Permanent atrial fibrillation: Secondary | ICD-10-CM

## 2017-10-24 DIAGNOSIS — I482 Chronic atrial fibrillation: Secondary | ICD-10-CM

## 2017-10-24 LAB — POCT INR: INR: 2.3

## 2017-10-24 NOTE — Patient Instructions (Signed)
Continue on same dosage 1/2 tablet daily except 1 tablet on Mondays.   Recheck in 6 weeks.

## 2017-10-27 ENCOUNTER — Other Ambulatory Visit: Payer: Self-pay | Admitting: Family Medicine

## 2017-11-06 ENCOUNTER — Other Ambulatory Visit: Payer: Self-pay | Admitting: Cardiovascular Disease

## 2017-11-13 ENCOUNTER — Encounter: Payer: Self-pay | Admitting: Family Medicine

## 2017-11-13 ENCOUNTER — Ambulatory Visit (INDEPENDENT_AMBULATORY_CARE_PROVIDER_SITE_OTHER): Payer: PPO | Admitting: Family Medicine

## 2017-11-13 VITALS — BP 160/80 | HR 66 | Temp 98.2°F | Resp 16 | Wt 172.4 lb

## 2017-11-13 DIAGNOSIS — L03818 Cellulitis of other sites: Secondary | ICD-10-CM

## 2017-11-13 DIAGNOSIS — E1159 Type 2 diabetes mellitus with other circulatory complications: Secondary | ICD-10-CM | POA: Diagnosis not present

## 2017-11-13 DIAGNOSIS — I10 Essential (primary) hypertension: Secondary | ICD-10-CM | POA: Diagnosis not present

## 2017-11-13 DIAGNOSIS — I25118 Atherosclerotic heart disease of native coronary artery with other forms of angina pectoris: Secondary | ICD-10-CM

## 2017-11-13 DIAGNOSIS — M109 Gout, unspecified: Secondary | ICD-10-CM | POA: Diagnosis not present

## 2017-11-13 DIAGNOSIS — E782 Mixed hyperlipidemia: Secondary | ICD-10-CM

## 2017-11-13 LAB — POCT GLYCOSYLATED HEMOGLOBIN (HGB A1C)
ESTIMATED AVERAGE GLUCOSE: 117
Hemoglobin A1C: 5.7

## 2017-11-13 MED ORDER — AMOXICILLIN 500 MG PO CAPS: 500.0000 mg | ORAL_CAPSULE | Freq: Three times a day (TID) | ORAL | 0 refills | Status: DC

## 2017-11-13 NOTE — Progress Notes (Signed)
Patient: [name] Male    DOB: 1927-12-05   84 y.o.   MRN: [id number] Visit Date: 11/13/2017  Today's Provider: Wilhemena Durie, MD   Chief Complaint  Patient presents with  . Follow-up    DM,HTN,lipids   Subjective:    HPI The patient is a 84 year old male who presents to the office for follow-up Hypertension,Diabetes and Cholesterol.  Patient was seen six month ago hypertension.His blood pressure at that time was 140/68. No changes made.His BP reading ranges are in the 182'X-937'J Diastolic and 69'C-78'L systolic. Symptoms: SOB but states he feels is under control.  Diabetes: Patient last A1C was 5.7.No changes made. Continue current treatment plan. Sugar levels at home 120's-145 in the AM. Patient wears compression socks and feels it helps with the swelling.  Lab Results  Component Value Date   HGBA1C 5.7 11/13/2017     Lipid/Cholesterol, Follow-up:   Last seen for this6 months ago.  Management changes since that visit include none. . Last Lipid Panel:    Component Value Date/Time   CHOL 114 03/21/2017 0913   TRIG 41 03/21/2017 0913   TRIG 46 02/12/2009 0000   HDL 73 03/21/2017 0913   LDLCALC 33 03/21/2017 0913    Risk factors for vascular disease include diabetes mellitus, hypercholesterolemia and hypertension  He reports excellent compliance with treatment. He is not having side effects.  Current symptoms include none and have been stable.   Wt Readings from Last 3 Encounters:  11/13/17 172 lb 6.4 oz (78.2 kg)  07/03/17 178 lb 3.2 oz (80.8 kg)  05/28/17 176 lb 3.2 oz (79.9 kg)    ------------------------------------------------------------------- Edema: Patient complains of edema. The location of the edema is hand(s) left.  The edema has been moderate.  Onset of symptoms was Friday Morning  gradually worsening since that time. The edema is present all day. The swelling has been aggravated by nothing, relieved by nothing, and been associated  with nothing. Associated symptoms: redness, pain,itching. Patient reports he woke up Friday morning with numbness and swelling. The redness he notice Saturday night and is now radiating up his arm. Reports he was not able to close his fingers Sunday morning but reports Monday evening he was able to closed them but now this morning he is having trouble with them.Reports he does have some itching. No injury and has not notice any bite. Treatments tried: none       Allergies  Allergen Reactions  . Pollen Extract      Current Outpatient Medications:  .  Ascorbic Acid (VITAMIN C) 500 MG tablet, Take 500 mg by mouth daily.  , Disp: , Rfl:  .  cloNIDine (CATAPRES) 0.2 MG tablet, TAKE 1 TABLET (0.2 MG TOTAL) BY MOUTH 2 (TWO) TIMES DAILY., Disp: 180 tablet, Rfl: 3 .  glucosamine-chondroitin 500-400 MG tablet, Take 1 tablet by mouth daily.  , Disp: , Rfl:  .  hydrALAZINE (APRESOLINE) 50 MG tablet, TAKE ONE TABLET BY MOUTH THREE TIMES A DAY AS NEEDED, Disp: 90 tablet, Rfl: 2 .  isosorbide mononitrate (IMDUR) 60 MG 24 hr tablet, Take 1 tablet (60 mg total) by mouth daily., Disp: 30 tablet, Rfl: 11 .  loratadine (CLARITIN) 10 MG tablet, TAKE 1 TABLET BY MOUTH DAILY AS NEEDED, Disp: 30 tablet, Rfl: 11 .  metoprolol succinate (TOPROL-XL) 25 MG 24 hr tablet, Take 1 tablet (25 mg total) by mouth daily. Take with or immediately following a meal., Disp: 90 tablet,  Rfl: 4 .  Multiple Vitamin (MULTIVITAMIN) tablet, Take 1 tablet by mouth daily.  , Disp: , Rfl:  .  ONE TOUCH ULTRA TEST test strip, USE TO TEST BLOOD SUGAR ONCE DAILY AS DIRECTED, Disp: 100 each, Rfl: 11 .  quinapril (ACCUPRIL) 40 MG tablet, TAKE 1 TABLET (40 MG TOTAL) BY MOUTH 2 (TWO) TIMES DAILY., Disp: 180 tablet, Rfl: 3 .  simvastatin (ZOCOR) 10 MG tablet, TAKE ONE TABLET BY MOUTH DAILY AT BEDTIME, Disp: 30 tablet, Rfl: 6 .  Tamsulosin HCl (FLOMAX) 0.4 MG CAPS, Take 0.4 mg by mouth daily.  , Disp: , Rfl:  .  torsemide (DEMADEX) 20 MG tablet,  Take 40 mg by mouth 2 (two) times daily. PRN, Disp: , Rfl:  .  traZODone (DESYREL) 100 MG tablet, TAKE ONE TABLET BY MOUTH EVERY NIGHT AT BEDTIME, Disp: 90 tablet, Rfl: 3 .  warfarin (COUMADIN) 5 MG tablet, TAKE AS DIRECTED BY COUMADIN CLINIC, Disp: 60 tablet, Rfl: 0 .  amoxicillin (AMOXIL) 500 MG capsule, Take 1 capsule (500 mg total) by mouth 3 (three) times daily., Disp: 21 capsule, Rfl: 0  Review of Systems  Constitutional: Negative for fever.  HENT: Negative.   Eyes: Negative.  Negative for visual disturbance.  Respiratory: Positive for shortness of breath ("still with SON,but I feel is under control").   Cardiovascular: Negative for chest pain and leg swelling.  Gastrointestinal: Negative.   Endocrine: Negative.   Musculoskeletal: Positive for arthralgias.  Skin: Positive for rash.  Allergic/Immunologic: Negative.   Neurological: Positive for numbness.  Psychiatric/Behavioral: Negative.     Social History   Tobacco Use  . Smoking status: Former Smoker    Last attempt to quit: 04/24/1975    Years since quitting: 42.5  . Smokeless tobacco: Never Used  Substance Use Topics  . Alcohol use: Yes    Alcohol/week: 6.6 - 13.2 oz    Types: 4 - 8 Standard drinks or equivalent, 7 - 14 Shots of liquor per week    Comment: 1-2 liquor drinks a day   Objective:   BP (!) 160/80 (BP Location: Right Arm, Patient Position: Sitting, Cuff Size: Normal)   Pulse 66   Temp 98.2 F (36.8 C) (Oral)   Resp 16   Wt 172 lb 6.4 oz (78.2 kg)   BMI 27.83 kg/m    Physical Exam  Constitutional: He is oriented to person, place, and time. He appears well-developed and well-nourished.  HENT:  Head: Normocephalic and atraumatic.  Eyes: Pupils are equal, round, and reactive to light. No scleral icterus.  Neck: No thyromegaly present.  Cardiovascular: Normal rate, regular rhythm and normal heart sounds.  Pulmonary/Chest: Effort normal and breath sounds normal.  Abdominal: Soft.  Neurological: He is  alert and oriented to person, place, and time.  Skin: Skin is warm and dry. There is erythema.  Diffuse erythema and mild swelling or radial side of wriste with associated red streaking up arm. No epitrochlear or axillary adenopathy.  Psychiatric: He has a normal mood and affect. His behavior is normal. Judgment and thought content normal.        Assessment & Plan:  1. HYPERTENSION, BENIGN  - CBC with Differential/Platelet  2. Type 2 diabetes mellitus with other circulatory complication, unspecified whether long term insulin use (HCC) - POCT glycosylated hemoglobin (Hb A1C)  3. Mixed hyperlipidemia  4. Cellulitis,left hand, of other specified site - Sed Rate (ESR) - Uric acid -Follow-up in 2 days. 5. Acute gout of left hand, unspecified cause -  Uric acid.Looks more like cellulitis today. -follow-up in 2 days 6.CAD      Wilhemena Durie, MD  [facility]

## 2017-11-13 NOTE — Patient Instructions (Signed)
Tylenol 650mg  every six hours as needed. Take Amoxil 500mg  TID for a week. Follow-up Cellulitis, Left Hand in 2 days.

## 2017-11-14 LAB — CBC WITH DIFFERENTIAL/PLATELET
BASOS: 0 %
Basophils Absolute: 0 10*3/uL (ref 0.0–0.2)
EOS (ABSOLUTE): 0 10*3/uL (ref 0.0–0.4)
Eos: 0 %
Hematocrit: 40.5 % (ref 37.5–51.0)
Hemoglobin: 13.6 g/dL (ref 13.0–17.7)
IMMATURE GRANS (ABS): 0 10*3/uL (ref 0.0–0.1)
IMMATURE GRANULOCYTES: 0 %
LYMPHS: 9 %
Lymphocytes Absolute: 0.6 10*3/uL — ABNORMAL LOW (ref 0.7–3.1)
MCH: 29.7 pg (ref 26.6–33.0)
MCHC: 33.6 g/dL (ref 31.5–35.7)
MCV: 88 fL (ref 79–97)
Monocytes Absolute: 1.1 10*3/uL — ABNORMAL HIGH (ref 0.1–0.9)
Monocytes: 15 %
NEUTROS PCT: 76 %
Neutrophils Absolute: 5.3 10*3/uL (ref 1.4–7.0)
PLATELETS: 170 10*3/uL (ref 150–379)
RBC: 4.58 x10E6/uL (ref 4.14–5.80)
RDW: 14.1 % (ref 12.3–15.4)
WBC: 7 10*3/uL (ref 3.4–10.8)

## 2017-11-14 LAB — SEDIMENTATION RATE: Sed Rate: 5 mm/hr (ref 0–30)

## 2017-11-14 LAB — URIC ACID: URIC ACID: 8.8 mg/dL — AB (ref 3.7–8.6)

## 2017-11-15 ENCOUNTER — Ambulatory Visit (INDEPENDENT_AMBULATORY_CARE_PROVIDER_SITE_OTHER): Payer: PPO | Admitting: Family Medicine

## 2017-11-15 VITALS — BP 136/60 | HR 60 | Temp 97.6°F | Resp 18

## 2017-11-15 DIAGNOSIS — R7303 Prediabetes: Secondary | ICD-10-CM

## 2017-11-15 DIAGNOSIS — L03119 Cellulitis of unspecified part of limb: Secondary | ICD-10-CM | POA: Diagnosis not present

## 2017-11-15 DIAGNOSIS — I25118 Atherosclerotic heart disease of native coronary artery with other forms of angina pectoris: Secondary | ICD-10-CM | POA: Diagnosis not present

## 2017-11-15 NOTE — Progress Notes (Signed)
Patient: [name] Male    DOB: May 22, 1928   84 y.o.   MRN: [id number] Visit Date: 11/15/2017  Today's Provider: Wilhemena Durie, MD   Chief Complaint  Patient presents with  . Cellulitis   Subjective:    HPI Pt is here today for a 2 day follow up for cellulitis. It is located on his left hand and wrist area. He was started on Amoxicillin. He reports that his hand feels much better and he got a good nights sleep and he can move his hand now.     Allergies  Allergen Reactions  . Pollen Extract      Current Outpatient Medications:  .  amoxicillin (AMOXIL) 500 MG capsule, Take 1 capsule (500 mg total) by mouth 3 (three) times daily., Disp: 21 capsule, Rfl: 0 .  Ascorbic Acid (VITAMIN C) 500 MG tablet, Take 500 mg by mouth daily.  , Disp: , Rfl:  .  cloNIDine (CATAPRES) 0.2 MG tablet, TAKE 1 TABLET (0.2 MG TOTAL) BY MOUTH 2 (TWO) TIMES DAILY., Disp: 180 tablet, Rfl: 3 .  glucosamine-chondroitin 500-400 MG tablet, Take 1 tablet by mouth daily.  , Disp: , Rfl:  .  hydrALAZINE (APRESOLINE) 50 MG tablet, TAKE ONE TABLET BY MOUTH THREE TIMES A DAY AS NEEDED, Disp: 90 tablet, Rfl: 2 .  isosorbide mononitrate (IMDUR) 60 MG 24 hr tablet, Take 1 tablet (60 mg total) by mouth daily., Disp: 30 tablet, Rfl: 11 .  loratadine (CLARITIN) 10 MG tablet, TAKE 1 TABLET BY MOUTH DAILY AS NEEDED, Disp: 30 tablet, Rfl: 11 .  metoprolol succinate (TOPROL-XL) 25 MG 24 hr tablet, Take 1 tablet (25 mg total) by mouth daily. Take with or immediately following a meal., Disp: 90 tablet, Rfl: 4 .  Multiple Vitamin (MULTIVITAMIN) tablet, Take 1 tablet by mouth daily.  , Disp: , Rfl:  .  ONE TOUCH ULTRA TEST test strip, USE TO TEST BLOOD SUGAR ONCE DAILY AS DIRECTED, Disp: 100 each, Rfl: 11 .  quinapril (ACCUPRIL) 40 MG tablet, TAKE 1 TABLET (40 MG TOTAL) BY MOUTH 2 (TWO) TIMES DAILY., Disp: 180 tablet, Rfl: 3 .  simvastatin (ZOCOR) 10 MG tablet, TAKE ONE TABLET BY MOUTH DAILY AT BEDTIME, Disp: 30  tablet, Rfl: 6 .  Tamsulosin HCl (FLOMAX) 0.4 MG CAPS, Take 0.4 mg by mouth daily.  , Disp: , Rfl:  .  torsemide (DEMADEX) 20 MG tablet, Take 40 mg by mouth 2 (two) times daily. PRN, Disp: , Rfl:  .  warfarin (COUMADIN) 5 MG tablet, TAKE AS DIRECTED BY COUMADIN CLINIC, Disp: 60 tablet, Rfl: 0 .  traZODone (DESYREL) 100 MG tablet, TAKE ONE TABLET BY MOUTH EVERY NIGHT AT BEDTIME, Disp: 90 tablet, Rfl: 3  Review of Systems  Constitutional: Negative.   HENT: Negative.   Eyes: Negative.   Respiratory: Negative.   Cardiovascular: Negative.   Endocrine: Negative.   Genitourinary: Negative.   Musculoskeletal: Negative.   Skin: Positive for color change, rash and wound.  Allergic/Immunologic: Negative.   Neurological: Negative.   Hematological: Negative.   Psychiatric/Behavioral: Negative.     Social History   Tobacco Use  . Smoking status: Former Smoker    Last attempt to quit: 04/24/1975    Years since quitting: 42.5  . Smokeless tobacco: Never Used  Substance Use Topics  . Alcohol use: Yes    Alcohol/week: 6.6 - 13.2 oz    Types: 4 - 8 Standard drinks or equivalent, 7 - 14 Shots  of liquor per week    Comment: 1-2 liquor drinks a day   Objective:   BP 136/60 (BP Location: Left Arm, Patient Position: Sitting, Cuff Size: Normal)   Pulse 60   Temp 97.6 F (36.4 C) (Oral)   Resp 18  Vitals:   11/15/17 1543  BP: 136/60  Pulse: 60  Resp: 18  Temp: 97.6 F (36.4 C)  TempSrc: Oral     Physical Exam  Constitutional: He appears well-developed and well-nourished.  HENT:  Head: Normocephalic.  Eyes: No scleral icterus.  Neck: No thyromegaly present.  Cardiovascular: Normal rate, regular rhythm and normal heart sounds.  Pulmonary/Chest: Effort normal and breath sounds normal.  Abdominal: Soft.  Lymphadenopathy:    He has no cervical adenopathy.  Skin: Skin is warm and dry. There is erythema.  Erythema improved with less tenderness. Red streak up arm has resolved    Psychiatric: He has a normal mood and affect. His behavior is normal. Judgment and thought content normal.        Assessment & Plan:     Cellulitis Improving. RTC nxt week to recheck as it still has some inflammation present. CAD Prediabetes    I have done the exam and reviewed the chart and it is accurate to the best of my knowledge. Development worker, community has been used and  any errors in dictation or transcription are unintentional. Miguel Aschoff M.D. [place], MD  [facility]

## 2017-11-17 NOTE — Progress Notes (Signed)
Cardiology Office Note  Date:  11/20/2017   ID:  Aaron, Gross 1927-10-29, MRN [id number]  PCP:  [name]., MD   Chief Complaint  Patient presents with  . other    6 month f/u c/o cellulitis left hand. Meds reviewed verbally with pt.    HPI:  [name] is a 84 y.o. male w/ PMHx s/f  CAD s/p CABG 2005 years ago,  permanent atrial fibrillation,  Coumadin anticoagulation,  single kidney RBBB,  HTN chronic LE edema, chronic diastolic CHF , EF >52%, mod LVH Moderate to severe  pulm HTN , L pleural effusion by echo 06/2016 Smoked, quit in 1970, 1 ppd  He presents for routine followup of his coronary artery disease  On his last clinic visit had shortness of breath, had been eating out, had fluid retention Was taking extra torsemide Today he reports that he Feels well,  Weight is down 5 pounds from previous clinic visit taking torsemide 20 in the morning  and 40 pm Has not had recent lab work done,  scheduled to see nephrology in 2 weeks time  Dr. Abigail Butts  No regular exercise program Denies chest pain or shortness of breath concerning for angina Heart rate continues to run low, feels he is asymptomatic  Previous lab work reviewed with him HBA1C 5.7 CBC normal SED rate 5 Uric acid: 8.8  CR 1.56 in 03/2017 K was 4.2  Blood pressure sometimes running hig in the PM List of blood pressures from home reviewed with him in detail Taking hydralazine at 2 pm, 50 mg  EKG personally reviewed by myself on today's visit  shows atrial fibrillation with ventricular rate 58 bpm, right bundle branch block, nonspecific ST and T wave abnormality.  There is a 2-second pause noted  Other past medical history Prior lab work, Hemoglobin A1c 6.2, total cholesterol in 2014 was 129 seen by Dr. Abigail Butts, renal service  Seen in clinic February 2015 at which time he had significant leg edema. Amlodipine was held Echocardiogram around that time showed normal LV systolic  function, moderate pulmonary hypertension with pleural effusion on the left Also had severe hypertension.    PMH:   has a past medical history of Atrial fibrillation ([place]), Coronary artery disease, Edema, Hypertension, RBBB (right bundle branch block), and Squamous cell carcinoma.  PSH:    Past Surgical History:  Procedure Laterality Date  . APPENDECTOMY    . CARDIOVERSION  09/21/08  . CORONARY ARTERY BYPASS GRAFT    . HERNIA REPAIR    . MELANOMA EXCISION     back  . NEPHRECTOMY    . PROSTATE ABLATION     transurethral needle ablation  . SQUAMOUS CELL CARCINOMA EXCISION     left hand & right ear.     Current Outpatient Medications  Medication Sig Dispense Refill  . amoxicillin (AMOXIL) 500 MG capsule Take 1 capsule (500 mg total) by mouth 3 (three) times daily. 21 capsule 0  . Ascorbic Acid (VITAMIN C) 500 MG tablet Take 500 mg by mouth daily.      . cloNIDine (CATAPRES) 0.2 MG tablet TAKE 1 TABLET (0.2 MG TOTAL) BY MOUTH 2 (TWO) TIMES DAILY. 180 tablet 3  . glucosamine-chondroitin 500-400 MG tablet Take 1 tablet by mouth daily.      . hydrALAZINE (APRESOLINE) 50 MG tablet TAKE ONE TABLET BY MOUTH THREE TIMES A DAY AS NEEDED 90 tablet 2  . isosorbide mononitrate (IMDUR) 60 MG 24 hr tablet Take 1  tablet (60 mg total) by mouth daily. 30 tablet 11  . loratadine (CLARITIN) 10 MG tablet TAKE 1 TABLET BY MOUTH DAILY AS NEEDED 30 tablet 11  . metoprolol succinate (TOPROL-XL) 25 MG 24 hr tablet Take 1 tablet (25 mg total) by mouth daily. Take with or immediately following a meal. 90 tablet 4  . Multiple Vitamin (MULTIVITAMIN) tablet Take 1 tablet by mouth daily.      . ONE TOUCH ULTRA TEST test strip USE TO TEST BLOOD SUGAR ONCE DAILY AS DIRECTED 100 each 11  . quinapril (ACCUPRIL) 40 MG tablet TAKE 1 TABLET (40 MG TOTAL) BY MOUTH 2 (TWO) TIMES DAILY. 180 tablet 3  . simvastatin (ZOCOR) 10 MG tablet TAKE ONE TABLET BY MOUTH DAILY AT BEDTIME 30 tablet 6  . Tamsulosin HCl (FLOMAX) 0.4 MG  CAPS Take 0.4 mg by mouth daily.      Marland Kitchen torsemide (DEMADEX) 20 MG tablet Take 40 mg by mouth 2 (two) times daily. PRN    . traZODone (DESYREL) 100 MG tablet TAKE ONE TABLET BY MOUTH EVERY NIGHT AT BEDTIME 90 tablet 3  . warfarin (COUMADIN) 5 MG tablet TAKE AS DIRECTED BY COUMADIN CLINIC 60 tablet 0   No current facility-administered medications for this visit.      Allergies:   Pollen extract   Social History:  The patient  reports that he quit smoking about 42 years ago. He has never used smokeless tobacco. He reports that he drinks about 6.6 - 13.2 oz of alcohol per week. He reports that he does not use drugs.   Family History:   family history includes Arthritis in his mother; Heart attack in his father.    Review of Systems: Review of Systems  Constitutional: Negative.   Respiratory: Negative.   Cardiovascular: Negative.   Gastrointestinal: Negative.   Musculoskeletal: Negative.   Neurological: Negative.   Psychiatric/Behavioral: Negative.   All other systems reviewed and are negative.    PHYSICAL EXAM: VS:  BP (!) 148/60 (BP Location: Left Arm, Patient Position: Sitting, Cuff Size: Normal)   Pulse (!) 58   Ht 5\' 6"  (1.676 m)   Wt 173 lb 8 oz (78.7 kg)   BMI 28.00 kg/m  , BMI Body mass index is 28 kg/m. Constitutional:  oriented to person, place, and time. No distress.  HENT:  Head: Normocephalic and atraumatic.  Eyes:  no discharge. No scleral icterus.  Neck: Normal range of motion. Neck supple. No JVD present.  Cardiovascular: Irregularly irregular,  normal heart sounds and intact distal pulses. Exam reveals no gallop and no friction rub. No edema No murmur heard. Pulmonary/Chest: Effort normal and breath sounds normal. No stridor. No respiratory distress.  no wheezes.  no rales.  no tenderness.  Abdominal: Soft.  no distension.  no tenderness.  Musculoskeletal: Normal range of motion.  no  tenderness or deformity.  Neurological:  normal muscle tone. Coordination  normal. No atrophy Skin: Skin is warm and dry. No rash noted. not diaphoretic.  Psychiatric:  normal mood and affect. behavior is normal. Thought content normal.    Recent Labs: 03/21/2017: ALT 27; BUN 27; Creatinine, Ser 1.56; Potassium 4.2; Sodium 145; TSH 2.380 11/13/2017: Hemoglobin 13.6; Platelets 170    Lipid Panel Lab Results  Component Value Date   CHOL 114 03/21/2017   HDL 73 03/21/2017   LDLCALC 33 03/21/2017   TRIG 41 03/21/2017      Wt Readings from Last 3 Encounters:  11/20/17 173 lb 8 oz (78.7 kg)  11/13/17 172 lb 6.4 oz (78.2 kg)  07/03/17 178 lb 3.2 oz (80.8 kg)       ASSESSMENT AND PLAN:  HYPERTENSION, BENIGN - Plan: EKG 12-Lead Sometimes high in the PM, better on repeat BP Gross sometimes Hydralazine at 2 PM If still high in the evening could take extra hydralazine  Atherosclerosis of native coronary artery of native heart without angina pectoris -  Currently with no symptoms of angina. No further workup at this time. Continue current medication regimen.stable  Permanent atrial fibrillation ([place]) - Plan: EKG 12-Lead Tolerating anticoagulation,  2-second pause on EKG in the setting of bradycardia, although asymptomatic we will decrease metoprolol down to 12.5 mg daily  Chronic diastolic CHF (congestive heart failure) ([place]) -  He increase torsemide up to 3 a day on his own but feels breathing is better Scheduled to have lab work with nephrology in 2 weeks time Previously had difficulty when eating out at restaurants Weight is down 5 pounds on aggressive diuretic regimen  Mixed hyperlipidemia - Plan: EKG 12-Lead Cholesterol is at goal on the current lipid regimen. No changes to the medications were made.  Stable  Localized edema - Plan: EKG 12-Lead Trace pitting edema around the ankles No significant change in presentation, stable  Shortness of breath - Plan: EKG 12-Lead Shortness of breath from chronic diastolic CHF has improved Lab work pending  through nephrology  Chronic renal impairment, stage 3 (moderate) - Plan: EKG 12-Lead Creatinine up to 1.5, at his baseline Followed by Dr. Abigail Butts  Type 2 diabetes mellitus with other circulatory complication, unspecified long term insulin use status ([place]) - Plan: EKG 12-Lead We have encouraged continued exercise, careful diet management in an effort to lose weight.  Weight is down 5 pounds   Total encounter time more than 25 minutes  Greater than 50% was spent in counseling and coordination of care with the patient   Disposition:   F/U  12 months   Orders Placed This Encounter  Procedures  . EKG 12-Lead     Signed, Esmond Plants, M.D., Ph.D. 11/20/2017  [place], [place]

## 2017-11-20 ENCOUNTER — Ambulatory Visit (INDEPENDENT_AMBULATORY_CARE_PROVIDER_SITE_OTHER): Payer: PPO | Admitting: Family Medicine

## 2017-11-20 ENCOUNTER — Ambulatory Visit: Payer: Self-pay | Admitting: Family Medicine

## 2017-11-20 ENCOUNTER — Encounter: Payer: Self-pay | Admitting: Cardiovascular Disease

## 2017-11-20 ENCOUNTER — Ambulatory Visit
Admission: RE | Admit: 2017-11-20 | Discharge: 2017-11-20 | Disposition: A | Discharge status: Home / Self Care | Payer: PPO | Source: Ambulatory Visit | Attending: Family Medicine | Admitting: Family Medicine

## 2017-11-20 ENCOUNTER — Ambulatory Visit: Payer: PPO | Admitting: Cardiovascular Disease

## 2017-11-20 VITALS — BP 170/80 | HR 68 | Temp 98.0°F | Resp 16 | Wt 172.0 lb

## 2017-11-20 VITALS — BP 148/60 | HR 58 | Ht 66.0 in | Wt 173.5 lb

## 2017-11-20 DIAGNOSIS — L03818 Cellulitis of other sites: Secondary | ICD-10-CM

## 2017-11-20 DIAGNOSIS — I25118 Atherosclerotic heart disease of native coronary artery with other forms of angina pectoris: Secondary | ICD-10-CM | POA: Diagnosis not present

## 2017-11-20 DIAGNOSIS — I482 Chronic atrial fibrillation: Secondary | ICD-10-CM

## 2017-11-20 DIAGNOSIS — M25432 Effusion, left wrist: Secondary | ICD-10-CM | POA: Diagnosis not present

## 2017-11-20 DIAGNOSIS — E782 Mixed hyperlipidemia: Secondary | ICD-10-CM

## 2017-11-20 DIAGNOSIS — I10 Essential (primary) hypertension: Secondary | ICD-10-CM

## 2017-11-20 DIAGNOSIS — M8580 Other specified disorders of bone density and structure, unspecified site: Secondary | ICD-10-CM | POA: Diagnosis not present

## 2017-11-20 DIAGNOSIS — M7989 Other specified soft tissue disorders: Secondary | ICD-10-CM | POA: Diagnosis not present

## 2017-11-20 DIAGNOSIS — I4821 Permanent atrial fibrillation: Secondary | ICD-10-CM

## 2017-11-20 DIAGNOSIS — E1159 Type 2 diabetes mellitus with other circulatory complications: Secondary | ICD-10-CM

## 2017-11-20 DIAGNOSIS — R0602 Shortness of breath: Secondary | ICD-10-CM

## 2017-11-20 DIAGNOSIS — I5032 Chronic diastolic (congestive) heart failure: Secondary | ICD-10-CM | POA: Diagnosis not present

## 2017-11-20 MED ORDER — AMOXICILLIN 500 MG PO CAPS: 500.0000 mg | ORAL_CAPSULE | Freq: Three times a day (TID) | ORAL | 0 refills | Status: DC

## 2017-11-20 NOTE — Patient Instructions (Addendum)
Medication Instructions:   Please cut the metoprolol in 1/2 daily  Labwork:  No new labs needed  Testing/Procedures:  No further testing at this time   Follow-Up: It was a pleasure seeing you in the office today. Please call us if you have new issues that need to be addressed before your next appt.  [phone]  Your physician wants you to follow-up in: 12 months.  You will receive a reminder letter in the mail two months in advance. If you don't receive a letter, please call our office to schedule the follow-up appointment.  If you need a refill on your cardiac medications before your next appointment, please call your pharmacy.  For educational health videos Log in to : [email or web address] Or : [email or web address], password : [email or web address]

## 2017-11-20 NOTE — Progress Notes (Signed)
[name]  MRN: [id number] DOB: Jan 09, 1928  Subjective:  HPI   The patient is an 84 year old male who presents for follow up of cellulitis.  He was last seen 1 week ago and states that it is a little better than it was.  However he still feels it is very tight and still quite painfull.  He did say that it is not as red as it had been.  Patient Active Problem List   Diagnosis Date Noted  . Actinic keratoses 12/10/2014  . Allergic rhinitis 12/10/2014  . A-fib ([place]) 12/10/2014  . CCF (congestive cardiac failure) ([place]) 12/10/2014  . Acid reflux 12/10/2014  . HLD (hyperlipidemia) 12/10/2014  . BP (high blood pressure) 12/10/2014  . Arthritis, degenerative 12/10/2014  . Diabetes mellitus, type 2 ([place]) 12/10/2014  . Chronic renal insufficiency 03/05/2014  . Status post nephrectomy 03/05/2014  . Chronic diastolic CHF (congestive heart failure) ([place]) 10/13/2013  . Pre-op evaluation 09/17/2013  . Permanent atrial fibrillation ([place]) 09/17/2013  . Long term current use of anticoagulant 10/25/2010  . Shortness of breath 11/01/2009  . Atherosclerosis of native coronary artery of native heart with stable angina pectoris ([place]) 10/22/2009  . HYPERTENSION, BENIGN 03/09/2009  . RBBB 12/29/2008  . EDEMA 12/29/2008    Past Medical History:  Diagnosis Date  . Atrial fibrillation ([facility])    coumadin therapy  . Coronary artery disease    native vessel  . Edema   . Hypertension   . RBBB (right bundle branch block)   . 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 Marital status: Widowed    Spouse name: Not on file  . Number of children: 3  . Years of education: Not on file  . Highest education level: Not on file  Occupational History  . Occupation: Charity fundraiser and Haematologist: RETIRED  Social Needs  . Financial resource strain: Not on file  . Food insecurity:    Worry: Not on file    Inability: Not on file  .  Transportation needs:    Medical: Not on file    Non-medical: Not on file  Tobacco Use  . Smoking status: Former Smoker    Last attempt to quit: 04/24/1975    Years since quitting: 42.6  . Smokeless tobacco: Never Used  Substance and Sexual Activity  . Alcohol use: Yes    Alcohol/week: 6.6 - 13.2 oz    Types: 4 - 8 Standard drinks or equivalent, 7 - 14 Shots of liquor per week    Comment: 1-2 liquor drinks a day  . Drug use: No  . Sexual activity: Not on file  Lifestyle  . Physical activity:    Days per week: Not on file    Minutes per session: Not on file  . Stress: Not on file  Relationships  . Social connections:    Talks on phone: Not on file    Gets together: Not on file    Attends religious service: Not on file    Active member of club or organization: Not on file    Attends meetings of clubs or organizations: Not on file    Relationship status: Not on file  . Intimate partner violence:    Fear of current or ex partner: Not on file    Emotionally abused: Not on file    Physically abused: Not on file  Forced sexual activity: Not on file  Other Topics Concern  . Not on file  Social History Narrative   He lives with his wife.          Outpatient Encounter Medications as of 11/20/2017  Medication Sig  . amoxicillin (AMOXIL) 500 MG capsule Take 1 capsule (500 mg total) by mouth 3 (three) times daily.  . Ascorbic Acid (VITAMIN C) 500 MG tablet Take 500 mg by mouth daily.    . cloNIDine (CATAPRES) 0.2 MG tablet TAKE 1 TABLET (0.2 MG TOTAL) BY MOUTH 2 (TWO) TIMES DAILY.  Marland Kitchen glucosamine-chondroitin 500-400 MG tablet Take 1 tablet by mouth daily.    . hydrALAZINE (APRESOLINE) 50 MG tablet TAKE ONE TABLET BY MOUTH THREE TIMES A DAY AS NEEDED  . isosorbide mononitrate (IMDUR) 60 MG 24 hr tablet Take 1 tablet (60 mg total) by mouth daily.  Marland Kitchen loratadine (CLARITIN) 10 MG tablet TAKE 1 TABLET BY MOUTH DAILY AS NEEDED  . metoprolol succinate (TOPROL-XL) 25 MG 24 hr tablet Take 1  tablet (25 mg total) by mouth daily. Take with or immediately following a meal.  . Multiple Vitamin (MULTIVITAMIN) tablet Take 1 tablet by mouth daily.    . ONE TOUCH ULTRA TEST test strip USE TO TEST BLOOD SUGAR ONCE DAILY AS DIRECTED  . quinapril (ACCUPRIL) 40 MG tablet TAKE 1 TABLET (40 MG TOTAL) BY MOUTH 2 (TWO) TIMES DAILY.  . simvastatin (ZOCOR) 10 MG tablet TAKE ONE TABLET BY MOUTH DAILY AT BEDTIME  . Tamsulosin HCl (FLOMAX) 0.4 MG CAPS Take 0.4 mg by mouth daily.    Marland Kitchen torsemide (DEMADEX) 20 MG tablet Take 40 mg by mouth 2 (two) times daily. PRN  . traZODone (DESYREL) 100 MG tablet TAKE ONE TABLET BY MOUTH EVERY NIGHT AT BEDTIME  . warfarin (COUMADIN) 5 MG tablet TAKE AS DIRECTED BY COUMADIN CLINIC   No facility-administered encounter medications on file as of 11/20/2017.     Allergies  Allergen Reactions  . Pollen Extract     Review of Systems  Constitutional: Negative for chills, fever and malaise/fatigue.  Eyes: Negative.   Respiratory: Negative for cough, shortness of breath and wheezing.   Cardiovascular: Negative for chest pain, palpitations, orthopnea, claudication and leg swelling.  Gastrointestinal: Negative.   Musculoskeletal: Positive for joint pain.  Skin: Negative.   Neurological: Negative.   Endo/Heme/Allergies: Negative.   Psychiatric/Behavioral: Negative.     Objective:  BP (!) 170/80 (BP Location: Right Arm, Patient Position: Sitting, Cuff Size: Normal)   Pulse 68   Temp 98 F (36.7 C) (Oral)   Resp 16   Wt 172 lb (78 kg)   BMI 27.76 kg/m   Physical Exam  Constitutional: He is oriented to person, place, and time and well-developed, well-nourished, and in no distress.  HENT:  Head: Normocephalic and atraumatic.  Eyes: Conjunctivae are normal. No scleral icterus.  Neck: No thyromegaly present.  Cardiovascular: Normal rate, regular rhythm and normal heart sounds.  Pulmonary/Chest: Effort normal and breath sounds normal.  Abdominal: Soft.    Neurological: He is alert and oriented to person, place, and time. Gait normal.  Skin: Skin is warm and dry. There is erythema.  Erythema,swelling probably 80% improved from presentation.   Psychiatric: Mood, memory, affect and judgment normal.    Assessment and Plan :  1. Cellulitis of other specified site  - amoxicillin (AMOXIL) 500 MG capsule; Take 1 capsule (500 mg total) by mouth 3 (three) times daily.  Dispense: 21 capsule; Refill: 0 -  DG Wrist Complete Left; Future 2.CAD  I have done the exam and reviewed the chart and it is accurate to the best of my knowledge. Development worker, community has been used and  any errors in dictation or transcription are unintentional. Miguel Aschoff M.D. [facility]

## 2017-11-21 ENCOUNTER — Telehealth: Payer: Self-pay

## 2017-11-21 NOTE — Telephone Encounter (Signed)
Pt advised.

## 2017-11-21 NOTE — Telephone Encounter (Signed)
-----   Message from Jerrol Banana., MD sent at 11/20/2017  4:17 PM EDT ----- Soft tissue swelling c/w cellulitis

## 2017-11-21 NOTE — Telephone Encounter (Signed)
[facility]  ED   ----- Message from Jerrol Banana., MD sent at 11/20/2017  4:17 PM EDT ----- Soft tissue swelling c/w cellulitis

## 2017-12-04 ENCOUNTER — Ambulatory Visit: Payer: Self-pay | Admitting: Family Medicine

## 2017-12-05 ENCOUNTER — Ambulatory Visit (INDEPENDENT_AMBULATORY_CARE_PROVIDER_SITE_OTHER): Payer: PPO

## 2017-12-05 DIAGNOSIS — D631 Anemia in chronic kidney disease: Secondary | ICD-10-CM | POA: Diagnosis not present

## 2017-12-05 DIAGNOSIS — I482 Chronic atrial fibrillation: Secondary | ICD-10-CM | POA: Diagnosis not present

## 2017-12-05 DIAGNOSIS — Z7901 Long term (current) use of anticoagulants: Secondary | ICD-10-CM | POA: Diagnosis not present

## 2017-12-05 DIAGNOSIS — I4821 Permanent atrial fibrillation: Secondary | ICD-10-CM

## 2017-12-05 DIAGNOSIS — N189 Chronic kidney disease, unspecified: Secondary | ICD-10-CM | POA: Diagnosis not present

## 2017-12-05 DIAGNOSIS — I509 Heart failure, unspecified: Secondary | ICD-10-CM | POA: Diagnosis not present

## 2017-12-05 DIAGNOSIS — N2581 Secondary hyperparathyroidism of renal origin: Secondary | ICD-10-CM | POA: Diagnosis not present

## 2017-12-05 LAB — POCT INR: INR: 2.5

## 2017-12-05 NOTE — Patient Instructions (Signed)
Continue on same dosage 1/2 tablet daily except 1 tablet on Mondays.  Recheck in 6 weeks.

## 2017-12-13 DIAGNOSIS — N2581 Secondary hyperparathyroidism of renal origin: Secondary | ICD-10-CM | POA: Diagnosis not present

## 2017-12-13 DIAGNOSIS — N183 Chronic kidney disease, stage 3 (moderate): Secondary | ICD-10-CM | POA: Diagnosis not present

## 2017-12-13 DIAGNOSIS — R809 Proteinuria, unspecified: Secondary | ICD-10-CM | POA: Diagnosis not present

## 2017-12-13 DIAGNOSIS — I129 Hypertensive chronic kidney disease with stage 1 through stage 4 chronic kidney disease, or unspecified chronic kidney disease: Secondary | ICD-10-CM | POA: Diagnosis not present

## 2017-12-24 ENCOUNTER — Telehealth: Payer: Self-pay | Admitting: Family Medicine

## 2017-12-24 NOTE — Telephone Encounter (Signed)
Pt called to see when he should schedule his next follow up appt with Dr. Rosanna Randy. Please advise. Thanks TNP

## 2017-12-24 NOTE — Telephone Encounter (Signed)
Pt was last seen for chronic problems on 11/23/17. She was seen on 11/20/17 for cellulitis.

## 2017-12-25 NOTE — Telephone Encounter (Signed)
4 months

## 2017-12-25 NOTE — Telephone Encounter (Signed)
LMTCB to schedule appt

## 2017-12-26 NOTE — Telephone Encounter (Signed)
Pt returned call. Pt is scheduled for f/u on 04/29/18. Thanks TNP

## 2018-01-15 DIAGNOSIS — H26492 Other secondary cataract, left eye: Secondary | ICD-10-CM | POA: Diagnosis not present

## 2018-01-15 DIAGNOSIS — H2511 Age-related nuclear cataract, right eye: Secondary | ICD-10-CM | POA: Diagnosis not present

## 2018-01-16 ENCOUNTER — Ambulatory Visit (INDEPENDENT_AMBULATORY_CARE_PROVIDER_SITE_OTHER): Payer: PPO

## 2018-01-16 DIAGNOSIS — Z7901 Long term (current) use of anticoagulants: Secondary | ICD-10-CM | POA: Diagnosis not present

## 2018-01-16 DIAGNOSIS — I482 Chronic atrial fibrillation: Secondary | ICD-10-CM | POA: Diagnosis not present

## 2018-01-16 DIAGNOSIS — Z5181 Encounter for therapeutic drug level monitoring: Secondary | ICD-10-CM | POA: Diagnosis not present

## 2018-01-16 DIAGNOSIS — I4821 Permanent atrial fibrillation: Secondary | ICD-10-CM

## 2018-01-16 LAB — POCT INR: INR: 1.8 — AB (ref 2.0–3.0)

## 2018-01-16 NOTE — Patient Instructions (Signed)
Please take 1 whole tablet tonight, then continue on same dosage 1/2 tablet daily except 1 tablet on Mondays.   Recheck in 6 weeks.

## 2018-01-17 ENCOUNTER — Other Ambulatory Visit: Payer: Self-pay | Admitting: Cardiovascular Disease

## 2018-01-18 NOTE — Telephone Encounter (Signed)
Please review for refill, Thanks !  

## 2018-01-21 DIAGNOSIS — Z85528 Personal history of other malignant neoplasm of kidney: Secondary | ICD-10-CM | POA: Diagnosis not present

## 2018-01-21 DIAGNOSIS — R351 Nocturia: Secondary | ICD-10-CM | POA: Diagnosis not present

## 2018-01-21 DIAGNOSIS — R350 Frequency of micturition: Secondary | ICD-10-CM | POA: Diagnosis not present

## 2018-01-21 DIAGNOSIS — N401 Enlarged prostate with lower urinary tract symptoms: Secondary | ICD-10-CM | POA: Diagnosis not present

## 2018-01-25 ENCOUNTER — Other Ambulatory Visit: Payer: Self-pay | Admitting: Family Medicine

## 2018-01-25 DIAGNOSIS — I10 Essential (primary) hypertension: Secondary | ICD-10-CM

## 2018-01-25 DIAGNOSIS — Z961 Presence of intraocular lens: Secondary | ICD-10-CM | POA: Diagnosis not present

## 2018-01-25 DIAGNOSIS — H26492 Other secondary cataract, left eye: Secondary | ICD-10-CM | POA: Diagnosis not present

## 2018-02-12 ENCOUNTER — Other Ambulatory Visit: Payer: Self-pay | Admitting: Cardiovascular Disease

## 2018-02-25 DIAGNOSIS — Z85820 Personal history of malignant melanoma of skin: Secondary | ICD-10-CM | POA: Diagnosis not present

## 2018-02-25 DIAGNOSIS — D485 Neoplasm of uncertain behavior of skin: Secondary | ICD-10-CM | POA: Diagnosis not present

## 2018-02-25 DIAGNOSIS — D0439 Carcinoma in situ of skin of other parts of face: Secondary | ICD-10-CM | POA: Diagnosis not present

## 2018-02-25 DIAGNOSIS — L821 Other seborrheic keratosis: Secondary | ICD-10-CM | POA: Diagnosis not present

## 2018-02-25 DIAGNOSIS — X32XXXA Exposure to sunlight, initial encounter: Secondary | ICD-10-CM | POA: Diagnosis not present

## 2018-02-25 DIAGNOSIS — Z872 Personal history of diseases of the skin and subcutaneous tissue: Secondary | ICD-10-CM | POA: Diagnosis not present

## 2018-02-25 DIAGNOSIS — D2261 Melanocytic nevi of right upper limb, including shoulder: Secondary | ICD-10-CM | POA: Diagnosis not present

## 2018-02-25 DIAGNOSIS — Z85828 Personal history of other malignant neoplasm of skin: Secondary | ICD-10-CM | POA: Diagnosis not present

## 2018-02-25 DIAGNOSIS — D2272 Melanocytic nevi of left lower limb, including hip: Secondary | ICD-10-CM | POA: Diagnosis not present

## 2018-02-25 DIAGNOSIS — Z08 Encounter for follow-up examination after completed treatment for malignant neoplasm: Secondary | ICD-10-CM | POA: Diagnosis not present

## 2018-02-25 DIAGNOSIS — L570 Actinic keratosis: Secondary | ICD-10-CM | POA: Diagnosis not present

## 2018-02-27 ENCOUNTER — Ambulatory Visit (INDEPENDENT_AMBULATORY_CARE_PROVIDER_SITE_OTHER): Payer: PPO

## 2018-02-27 DIAGNOSIS — I4821 Permanent atrial fibrillation: Secondary | ICD-10-CM

## 2018-02-27 DIAGNOSIS — Z7901 Long term (current) use of anticoagulants: Secondary | ICD-10-CM | POA: Diagnosis not present

## 2018-02-27 DIAGNOSIS — I482 Chronic atrial fibrillation: Secondary | ICD-10-CM | POA: Diagnosis not present

## 2018-02-27 LAB — POCT INR: INR: 1.7 — AB (ref 2.0–3.0)

## 2018-02-27 NOTE — Patient Instructions (Signed)
Please take 1 whole tablet tonight, then START NEW DOSAGE of 1/2 tablet daily except 1 tablet on Mondays and Fridays.  Recheck in 4 weeks.

## 2018-03-01 ENCOUNTER — Encounter: Payer: Self-pay | Admitting: Cardiovascular Disease

## 2018-03-05 NOTE — Telephone Encounter (Signed)
This encounter was created in error - please disregard.

## 2018-03-27 ENCOUNTER — Ambulatory Visit: Payer: PPO

## 2018-03-27 DIAGNOSIS — I4821 Permanent atrial fibrillation: Secondary | ICD-10-CM

## 2018-03-27 DIAGNOSIS — Z7901 Long term (current) use of anticoagulants: Secondary | ICD-10-CM | POA: Diagnosis not present

## 2018-03-27 DIAGNOSIS — I482 Chronic atrial fibrillation: Secondary | ICD-10-CM | POA: Diagnosis not present

## 2018-03-27 LAB — POCT INR: INR: 2.6 (ref 2.0–3.0)

## 2018-03-27 NOTE — Patient Instructions (Signed)
Please continue dosage of 1/2 tablet daily except 1 tablet on Mondays and Fridays.  Recheck in 5 weeks.

## 2018-04-04 ENCOUNTER — Other Ambulatory Visit: Payer: Self-pay | Admitting: Cardiovascular Disease

## 2018-04-05 NOTE — Telephone Encounter (Signed)
Refill Request.  

## 2018-04-09 ENCOUNTER — Other Ambulatory Visit: Payer: Self-pay

## 2018-04-09 NOTE — Telephone Encounter (Signed)
Fax received from [facility]  Requesting refills on Warfarin 5mg 

## 2018-04-13 ENCOUNTER — Other Ambulatory Visit: Payer: Self-pay | Admitting: Family Medicine

## 2018-04-13 ENCOUNTER — Other Ambulatory Visit: Payer: Self-pay | Admitting: Cardiovascular Disease

## 2018-04-13 DIAGNOSIS — G4709 Other insomnia: Secondary | ICD-10-CM

## 2018-04-18 ENCOUNTER — Telehealth: Payer: Self-pay

## 2018-04-18 NOTE — Telephone Encounter (Signed)
LMTCB and schedule AWV. Last AWV was 05/28/17. -MM

## 2018-04-29 ENCOUNTER — Ambulatory Visit (INDEPENDENT_AMBULATORY_CARE_PROVIDER_SITE_OTHER): Payer: PPO | Admitting: Family Medicine

## 2018-04-29 ENCOUNTER — Encounter: Payer: Self-pay | Admitting: Family Medicine

## 2018-04-29 VITALS — BP 116/54 | HR 56 | Temp 97.8°F | Wt 174.0 lb

## 2018-04-29 DIAGNOSIS — E1159 Type 2 diabetes mellitus with other circulatory complications: Secondary | ICD-10-CM

## 2018-04-29 DIAGNOSIS — R609 Edema, unspecified: Secondary | ICD-10-CM | POA: Diagnosis not present

## 2018-04-29 DIAGNOSIS — E782 Mixed hyperlipidemia: Secondary | ICD-10-CM | POA: Diagnosis not present

## 2018-04-29 DIAGNOSIS — I10 Essential (primary) hypertension: Secondary | ICD-10-CM

## 2018-04-29 DIAGNOSIS — D0439 Carcinoma in situ of skin of other parts of face: Secondary | ICD-10-CM | POA: Diagnosis not present

## 2018-04-29 DIAGNOSIS — D692 Other nonthrombocytopenic purpura: Secondary | ICD-10-CM | POA: Diagnosis not present

## 2018-04-29 DIAGNOSIS — Z23 Encounter for immunization: Secondary | ICD-10-CM

## 2018-04-29 LAB — POCT GLYCOSYLATED HEMOGLOBIN (HGB A1C): HEMOGLOBIN A1C: 5.4 % (ref 4.0–5.6)

## 2018-04-29 NOTE — Progress Notes (Signed)
Patient: [name] Male    DOB: 05/19/1928   84 y.o.   MRN: [id number] Visit Date: 04/29/2018  Today's Provider: Wilhemena Durie, MD   Chief Complaint  Patient presents with  . Hyperlipidemia  . Hypertension  . Diabetes   Subjective:    Hyperlipidemia  This is a chronic problem. The problem is controlled. Associated symptoms include shortness of breath (Chronic issue). Pertinent negatives include no chest pain. Current antihyperlipidemic treatment includes statins. There are no compliance problems.   Hypertension  This is a chronic problem. The problem is unchanged. The problem is controlled. Associated symptoms include peripheral edema and shortness of breath (Chronic issue). Pertinent negatives include no anxiety, blurred vision, chest pain, headaches, malaise/fatigue, neck pain, orthopnea, palpitations, PND or sweats. There are no compliance problems.   Diabetes  He presents for his follow-up diabetic visit. He has type 2 diabetes mellitus. His disease course has been stable. There are no hypoglycemic associated symptoms. Pertinent negatives for hypoglycemia include no dizziness, headaches or sweats. ( ) Associated symptoms include fatigue. Pertinent negatives for diabetes include no blurred vision, no chest pain, no foot paresthesias, no foot ulcerations, no polydipsia, no polyphagia, no polyuria, no visual change, no weakness and no weight loss. He is compliant with treatment all of the time. Home blood sugar record trend: Fasting Blood sugars are 120-130's.  In recent months he has been more tired and in recent weeks has had more LE edema. Lab Results  Component Value Date   HGBA1C 5.7 11/13/2017   BP Readings from Last 3 Encounters:  04/29/18 (!) 116/54  11/20/17 (!) 170/80  11/20/17 (!) 148/60   Lab Results  Component Value Date   CHOL 114 03/21/2017   CHOL 127 07/18/2016   CHOL 116 08/04/2015   Lab Results  Component Value Date   HDL 73 03/21/2017   HDL 56 07/18/2016   HDL 52 08/04/2015   Lab Results  Component Value Date   LDLCALC 33 03/21/2017   LDLCALC 54 07/18/2016   LDLCALC 51 08/04/2015   Lab Results  Component Value Date   TRIG 41 03/21/2017   TRIG 87 07/18/2016   TRIG 66 08/04/2015   No results found for: CHOLHDL No results found for: LDLDIRECT Wt Readings from Last 3 Encounters:  04/29/18 174 lb (78.9 kg)  11/20/17 172 lb (78 kg)  11/20/17 173 lb 8 oz (78.7 kg)       Allergies  Allergen Reactions  . Pollen Extract       Review of Systems  Constitutional: Positive for fatigue. Negative for activity change, appetite change, chills, diaphoresis, fever, malaise/fatigue, unexpected weight change and weight loss.  Eyes: Negative for blurred vision.  Respiratory: Positive for shortness of breath (Chronic issue). Negative for apnea, cough, choking, chest tightness, wheezing and stridor.   Cardiovascular: Positive for leg swelling. Negative for chest pain, palpitations, orthopnea and PND.  Gastrointestinal: Negative.   Endocrine: Negative for polydipsia, polyphagia and polyuria.  Musculoskeletal: Negative.  Negative for neck pain.  Allergic/Immunologic: Negative.   Neurological: Negative for dizziness, weakness, light-headedness, numbness and headaches.  Psychiatric/Behavioral: Negative.     Social History   Tobacco Use  . Smoking status: Former Smoker    Last attempt to quit: 04/24/1975    Years since quitting: 43.0  . Smokeless tobacco: Never Used  Substance Use Topics  . Alcohol use: Yes    Alcohol/week: 11.0 - 22.0 standard drinks    Types: 4 - 8  Standard drinks or equivalent, 7 - 14 Shots of liquor per week    Comment: 1-2 liquor drinks a day   Objective:   BP (!) 116/54 (BP Location: Left Arm, Patient Position: Sitting, Cuff Size: Normal)   Pulse (!) 56   Temp 97.8 F (36.6 C) (Oral)   Wt 174 lb (78.9 kg)   SpO2 93%   BMI 28.08 kg/m  Vitals:   04/29/18 1108  BP: (!) 116/54  Pulse: (!) 56   Temp: 97.8 F (36.6 C)  TempSrc: Oral  SpO2: 93%  Weight: 174 lb (78.9 kg)     Physical Exam  Constitutional: He appears well-developed and well-nourished.  HENT:  Head: Normocephalic and atraumatic.  Right Ear: External ear normal.  Left Ear: External ear normal.  Nose: Nose normal.  Eyes: Conjunctivae are normal. No scleral icterus.  Neck: No JVD present. No thyromegaly present.  Cardiovascular: Normal rate, regular rhythm and normal heart sounds.  Pulmonary/Chest: Effort normal and breath sounds normal.  Abdominal: Soft.  Musculoskeletal: He exhibits edema.  2+ LE edema.  Lymphadenopathy:    He has no cervical adenopathy.  Skin: Skin is warm and dry.  Senile purpura of arms.  Psychiatric: He has a normal mood and affect. His behavior is normal. Judgment and thought content normal.        Assessment & Plan:     1. Essential hypertension Stable Continue current medications.   - CBC with Differential/Platelet - TSH - Comprehensive metabolic panel - Pro b natriuretic peptide (BNP)  2. Type 2 diabetes mellitus with other circulatory complication, unspecified whether long term insulin use (HCC) A1C Stable at 5.4.  Continue current medications and recheck in 3-4 months.    - POCT glycosylated hemoglobin (Hb A1C) - CBC with Differential/Platelet - Comprehensive metabolic panel  3. Mixed hyperlipidemia Will check labs.   - CBC with Differential/Platelet - Lipid panel  4. Need for influenza vaccination Flu Vaccine given today.   - Flu vaccine HIGH DOSE PF (Fluzone High dose)  5. Edema, unspecified type Worsening, will check BNP.   - Pro b natriuretic peptide (BNP) 6.Possible CHF 7.Senile Purpura      Wilhemena Durie, MD  [facility]

## 2018-04-30 DIAGNOSIS — R609 Edema, unspecified: Secondary | ICD-10-CM | POA: Diagnosis not present

## 2018-04-30 DIAGNOSIS — I10 Essential (primary) hypertension: Secondary | ICD-10-CM | POA: Diagnosis not present

## 2018-04-30 DIAGNOSIS — E782 Mixed hyperlipidemia: Secondary | ICD-10-CM | POA: Diagnosis not present

## 2018-04-30 DIAGNOSIS — E1159 Type 2 diabetes mellitus with other circulatory complications: Secondary | ICD-10-CM | POA: Diagnosis not present

## 2018-05-01 ENCOUNTER — Ambulatory Visit: Payer: PPO

## 2018-05-01 DIAGNOSIS — I482 Chronic atrial fibrillation: Secondary | ICD-10-CM

## 2018-05-01 DIAGNOSIS — I4821 Permanent atrial fibrillation: Secondary | ICD-10-CM

## 2018-05-01 DIAGNOSIS — Z7901 Long term (current) use of anticoagulants: Secondary | ICD-10-CM | POA: Diagnosis not present

## 2018-05-01 LAB — CBC WITH DIFFERENTIAL/PLATELET
BASOS ABS: 0 10*3/uL (ref 0.0–0.2)
Basos: 1 %
EOS (ABSOLUTE): 0 10*3/uL (ref 0.0–0.4)
Eos: 0 %
Hematocrit: 37.5 % (ref 37.5–51.0)
Hemoglobin: 12.4 g/dL — ABNORMAL LOW (ref 13.0–17.7)
IMMATURE GRANS (ABS): 0 10*3/uL (ref 0.0–0.1)
Immature Granulocytes: 0 %
LYMPHS: 10 %
Lymphocytes Absolute: 0.6 10*3/uL — ABNORMAL LOW (ref 0.7–3.1)
MCH: 30 pg (ref 26.6–33.0)
MCHC: 33.1 g/dL (ref 31.5–35.7)
MCV: 91 fL (ref 79–97)
MONOCYTES: 10 %
MONOS ABS: 0.6 10*3/uL (ref 0.1–0.9)
NEUTROS ABS: 4.8 10*3/uL (ref 1.4–7.0)
Neutrophils: 79 %
PLATELETS: 144 10*3/uL — AB (ref 150–450)
RBC: 4.13 x10E6/uL — ABNORMAL LOW (ref 4.14–5.80)
RDW: 12.5 % (ref 12.3–15.4)
WBC: 6.1 10*3/uL (ref 3.4–10.8)

## 2018-05-01 LAB — COMPREHENSIVE METABOLIC PANEL
ALBUMIN: 3.7 g/dL (ref 3.5–4.7)
ALT: 15 IU/L (ref 0–44)
AST: 64 IU/L — ABNORMAL HIGH (ref 0–40)
Albumin/Globulin Ratio: 1.8 (ref 1.2–2.2)
Alkaline Phosphatase: 122 IU/L — ABNORMAL HIGH (ref 39–117)
BILIRUBIN TOTAL: 0.8 mg/dL (ref 0.0–1.2)
BUN / CREAT RATIO: 18 (ref 10–24)
BUN: 29 mg/dL — AB (ref 8–27)
CALCIUM: 9.1 mg/dL (ref 8.6–10.2)
CO2: 23 mmol/L (ref 20–29)
Chloride: 107 mmol/L — ABNORMAL HIGH (ref 96–106)
Creatinine, Ser: 1.58 mg/dL — ABNORMAL HIGH (ref 0.76–1.27)
GFR calc non Af Amer: 39 mL/min/{1.73_m2} — ABNORMAL LOW (ref >59)
GFR, EST AFRICAN AMERICAN: 45 mL/min/{1.73_m2} — AB (ref >59)
GLUCOSE: 97 mg/dL (ref 65–99)
Globulin, Total: 2.1 g/dL (ref 1.5–4.5)
Potassium: 3.8 mmol/L (ref 3.5–5.2)
Sodium: 146 mmol/L — ABNORMAL HIGH (ref 134–144)
Total Protein: 5.8 g/dL — ABNORMAL LOW (ref 6.0–8.5)

## 2018-05-01 LAB — LIPID PANEL
CHOL/HDL RATIO: 1.6 ratio (ref 0.0–5.0)
Cholesterol, Total: 93 mg/dL — ABNORMAL LOW (ref 100–199)
HDL: 59 mg/dL (ref >39)
LDL Calculated: 22 mg/dL (ref 0–99)
Triglycerides: 59 mg/dL (ref 0–149)
VLDL Cholesterol Cal: 12 mg/dL (ref 5–40)

## 2018-05-01 LAB — POCT INR: INR: 3.1 — AB (ref 2.0–3.0)

## 2018-05-01 LAB — TSH: TSH: 3.65 u[IU]/mL (ref 0.450–4.500)

## 2018-05-01 LAB — PRO B NATRIURETIC PEPTIDE: NT-Pro BNP: 1843 pg/mL — ABNORMAL HIGH (ref 0–486)

## 2018-05-01 NOTE — Patient Instructions (Signed)
Please skip coumadin today, then continue dosage of 1/2 tablet daily except 1 tablet on Mondays and Fridays.  Recheck in 5 weeks.

## 2018-05-03 NOTE — Telephone Encounter (Signed)
LW scheduled AWV for 05/27/18 @ 1040 AM. -MM

## 2018-05-06 ENCOUNTER — Telehealth: Payer: Self-pay

## 2018-05-06 NOTE — Telephone Encounter (Signed)
-----   Message from Jerrol Banana., MD sent at 05/06/2018 10:59 AM EDT ----- Labs ok for now--please j=have pt f/u in 1-2 weeks with me--thanks

## 2018-05-06 NOTE — Telephone Encounter (Signed)
LMTCB

## 2018-05-06 NOTE — Telephone Encounter (Signed)
Pt returned call ° °teri °

## 2018-05-08 NOTE — Telephone Encounter (Signed)
LMTCB 05/08/2018  Thanks,   -Mickel Baas

## 2018-05-08 NOTE — Telephone Encounter (Signed)
Pt advised.  He has an appointment for 05/15/2018  Thanks,   -Mickel Baas

## 2018-05-15 ENCOUNTER — Ambulatory Visit (INDEPENDENT_AMBULATORY_CARE_PROVIDER_SITE_OTHER): Payer: PPO | Admitting: Family Medicine

## 2018-05-15 ENCOUNTER — Ambulatory Visit
Admission: RE | Admit: 2018-05-15 | Discharge: 2018-05-15 | Disposition: A | Discharge status: Home / Self Care | Payer: PPO | Source: Ambulatory Visit | Attending: Family Medicine | Admitting: Family Medicine

## 2018-05-15 VITALS — BP 140/70 | HR 66 | Temp 98.1°F | Resp 16 | Wt 184.0 lb

## 2018-05-15 DIAGNOSIS — R1900 Intra-abdominal and pelvic swelling, mass and lump, unspecified site: Secondary | ICD-10-CM

## 2018-05-15 DIAGNOSIS — Z905 Acquired absence of kidney: Secondary | ICD-10-CM

## 2018-05-15 DIAGNOSIS — R600 Localized edema: Secondary | ICD-10-CM | POA: Diagnosis not present

## 2018-05-15 DIAGNOSIS — N183 Chronic kidney disease, stage 3 unspecified: Secondary | ICD-10-CM

## 2018-05-15 DIAGNOSIS — R918 Other nonspecific abnormal finding of lung field: Secondary | ICD-10-CM | POA: Insufficient documentation

## 2018-05-15 DIAGNOSIS — R0602 Shortness of breath: Secondary | ICD-10-CM | POA: Insufficient documentation

## 2018-05-15 DIAGNOSIS — J90 Pleural effusion, not elsewhere classified: Secondary | ICD-10-CM | POA: Insufficient documentation

## 2018-05-15 DIAGNOSIS — I509 Heart failure, unspecified: Secondary | ICD-10-CM | POA: Insufficient documentation

## 2018-05-15 DIAGNOSIS — I4891 Unspecified atrial fibrillation: Secondary | ICD-10-CM | POA: Diagnosis not present

## 2018-05-15 DIAGNOSIS — R188 Other ascites: Secondary | ICD-10-CM

## 2018-05-15 DIAGNOSIS — I25118 Atherosclerotic heart disease of native coronary artery with other forms of angina pectoris: Secondary | ICD-10-CM | POA: Diagnosis not present

## 2018-05-15 NOTE — Progress Notes (Signed)
[name]  MRN: [id number] DOB: [birth year]  Subjective:  HPI   The patient is an 84 year old male who presents today at the request of the provider to review labs done on 04/30/18.  The patient has complaint today of edema and legs weak and feeling heavy. He states he is a little more short of breath.  He has gained 10 pounds since seeing [place] on 04/29/18.  He is taking his diuretic but states he is not urinating as much.  Patient states he has appointment with Dr Elvin So. He feels an increase in abdominal girth lately. He states that he has gone back to taking Tylenol for pain.  Patient Active Problem List   Diagnosis Date Noted  . Actinic keratoses 12/10/2014  . Allergic rhinitis 12/10/2014  . A-fib ([place]) 12/10/2014  . CCF (congestive cardiac failure) ([place]) 12/10/2014  . Acid reflux 12/10/2014  . HLD (hyperlipidemia) 12/10/2014  . BP (high blood pressure) 12/10/2014  . Arthritis, degenerative 12/10/2014  . Diabetes mellitus, type 2 ([place]) 12/10/2014  . Chronic renal insufficiency 03/05/2014  . Status post nephrectomy 03/05/2014  . Chronic diastolic CHF (congestive heart failure) ([place]) 10/13/2013  . Pre-op evaluation 09/17/2013  . Permanent atrial fibrillation 09/17/2013  . Long term current use of anticoagulant 10/25/2010  . Shortness of breath 11/01/2009  . Atherosclerosis of native coronary artery of native heart with stable angina pectoris ([place]) 10/22/2009  . HYPERTENSION, BENIGN 03/09/2009  . RBBB 12/29/2008  . EDEMA 12/29/2008    Past Medical History:  Diagnosis Date  . Atrial fibrillation (HCC)    coumadin therapy  . Coronary artery disease    native vessel  . Edema   . Hypertension   . RBBB (right bundle branch block)   . 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 Marital status: Widowed    Spouse name: Not on file  . Number of children: 3  . Years of education: Not on file  . Highest education  level: Not on file  Occupational History  . Occupation: Charity fundraiser and Haematologist: RETIRED  Social Needs  . Financial resource strain: Not on file  . Food insecurity:    Worry: Not on file    Inability: Not on file  . Transportation needs:    Medical: Not on file    Non-medical: Not on file  Tobacco Use  . Smoking status: Former Smoker    Last attempt to quit: 04/24/1975    Years since quitting: 43.0  . Smokeless tobacco: Never Used  Substance and Sexual Activity  . Alcohol use: Yes    Alcohol/week: 11.0 - 22.0 standard drinks    Types: 4 - 8 Standard drinks or equivalent, 7 - 14 Shots of liquor per week    Comment: 1-2 liquor drinks a day  . Drug use: No  . Sexual activity: Not on file  Lifestyle  . Physical activity:    Days per week: Not on file    Minutes per session: Not on file  . Stress: Not on file  Relationships  . Social connections:    Talks on phone: Not on file    Gets together: Not on file    Attends religious service: Not on file    Active member of club or organization: Not on file    Attends meetings of clubs or organizations: Not on file  Relationship status: Not on file  . Intimate partner violence:    Fear of current or ex partner: Not on file    Emotionally abused: Not on file    Physically abused: Not on file    Forced sexual activity: Not on file  Other Topics Concern  . Not on file  Social History Narrative   He lives with his wife.            Allergies  Allergen Reactions  . Pollen Extract     Review of Systems  Constitutional: Negative for fever and malaise/fatigue.  HENT: Negative.   Eyes: Negative.   Respiratory: Positive for cough, sputum production (clear) and shortness of breath. Negative for wheezing.   Cardiovascular: Positive for leg swelling. Negative for chest pain, palpitations, orthopnea and claudication.  Gastrointestinal:       Bloating.  Genitourinary: Negative.   Skin: Negative.     Endo/Heme/Allergies: Negative.   Psychiatric/Behavioral: Negative.     Objective:  BP 140/70 (BP Location: Left Arm, Patient Position: Sitting, Cuff Size: Normal)   Pulse 66   Temp 98.1 F (36.7 C) (Oral)   Resp 16   Wt 184 lb (83.5 kg)   SpO2 93%   BMI 29.70 kg/m   Physical Exam  Constitutional: He is well-developed, well-nourished, and in no distress.  HENT:  Head: Normocephalic and atraumatic.  Right Ear: External ear normal.  Left Ear: External ear normal.  Nose: Nose normal.  Eyes: Conjunctivae are normal.  Neck: No JVD present. No thyromegaly present.  Cardiovascular: Normal rate, regular rhythm and normal heart sounds.  Pulmonary/Chest: Effort normal and breath sounds normal.  Abdominal: Soft. He exhibits distension.  Maybe mildly distended compared to norm.  Musculoskeletal: He exhibits edema.  2+ EDEMA.  Neurological: He is alert. Gait normal. GCS score is 15.  Skin: Skin is warm and dry.  Psychiatric: Mood, memory, affect and judgment normal.    Assessment and Plan :  1. Shortness of breath/Poss mild CHF For 1 week add zarolyn 5 mg q am to see how he feels  Repeat renal next week.  - EKG 12-Lead - Renal function panel - B Nat Peptide - DG Chest 2 View; Future  2. Abdominal swelling  - US Abdomen Complete; Future  3. Other ascites  - US Abdomen Complete; Future  4. Lower extremity edema  - Renal function panel - B Nat Peptide - DG Chest 2 View; Future 5.CKD 6.CAD  I have done the exam and reviewed the chart and it is accurate to the best of my knowledge. Development worker, community has been used and  any errors in dictation or transcription are unintentional. Miguel Aschoff M.D. [facility]

## 2018-05-16 ENCOUNTER — Other Ambulatory Visit: Payer: Self-pay

## 2018-05-16 ENCOUNTER — Telehealth: Payer: Self-pay

## 2018-05-16 DIAGNOSIS — I509 Heart failure, unspecified: Secondary | ICD-10-CM

## 2018-05-16 DIAGNOSIS — R609 Edema, unspecified: Secondary | ICD-10-CM

## 2018-05-16 LAB — RENAL FUNCTION PANEL
Albumin: 3.4 g/dL — ABNORMAL LOW (ref 3.5–4.7)
BUN / CREAT RATIO: 17 (ref 10–24)
BUN: 27 mg/dL (ref 8–27)
CO2: 24 mmol/L (ref 20–29)
Calcium: 9 mg/dL (ref 8.6–10.2)
Chloride: 101 mmol/L (ref 96–106)
Creatinine, Ser: 1.58 mg/dL — ABNORMAL HIGH (ref 0.76–1.27)
GFR, EST AFRICAN AMERICAN: 45 mL/min/{1.73_m2} — AB (ref >59)
GFR, EST NON AFRICAN AMERICAN: 39 mL/min/{1.73_m2} — AB (ref >59)
GLUCOSE: 103 mg/dL — AB (ref 65–99)
Phosphorus: 2.9 mg/dL (ref 2.5–4.5)
Potassium: 3.8 mmol/L (ref 3.5–5.2)
Sodium: 142 mmol/L (ref 134–144)

## 2018-05-16 LAB — BRAIN NATRIURETIC PEPTIDE: BNP: 580.9 pg/mL — ABNORMAL HIGH (ref 0.0–100.0)

## 2018-05-16 MED ORDER — METOLAZONE 5 MG PO TABS: 5.0000 mg | ORAL_TABLET | Freq: Every day | ORAL | 3 refills | Status: DC

## 2018-05-16 NOTE — Telephone Encounter (Signed)
-----   Message from Jerrol Banana., MD sent at 05/16/2018  8:18 AM EDT ----- Mild CHF being treated.

## 2018-05-16 NOTE — Telephone Encounter (Signed)
yes

## 2018-05-16 NOTE — Telephone Encounter (Signed)
Pt advised.  He is wanting to know if he should take the torsemide with the new RX.    Thanks,   -Mickel Baas

## 2018-05-17 NOTE — Telephone Encounter (Signed)
Advised  ED 

## 2018-05-20 ENCOUNTER — Ambulatory Visit
Admission: RE | Admit: 2018-05-20 | Discharge: 2018-05-20 | Disposition: A | Discharge status: Home / Self Care | Payer: PPO | Source: Ambulatory Visit | Attending: Family Medicine | Admitting: Family Medicine

## 2018-05-20 ENCOUNTER — Ambulatory Visit: Payer: PPO

## 2018-05-20 DIAGNOSIS — R188 Other ascites: Secondary | ICD-10-CM | POA: Diagnosis not present

## 2018-05-20 DIAGNOSIS — R160 Hepatomegaly, not elsewhere classified: Secondary | ICD-10-CM | POA: Diagnosis not present

## 2018-05-20 DIAGNOSIS — Z905 Acquired absence of kidney: Secondary | ICD-10-CM | POA: Insufficient documentation

## 2018-05-20 DIAGNOSIS — R1900 Intra-abdominal and pelvic swelling, mass and lump, unspecified site: Secondary | ICD-10-CM

## 2018-05-20 DIAGNOSIS — K862 Cyst of pancreas: Secondary | ICD-10-CM | POA: Insufficient documentation

## 2018-05-20 DIAGNOSIS — N281 Cyst of kidney, acquired: Secondary | ICD-10-CM | POA: Diagnosis not present

## 2018-05-22 ENCOUNTER — Encounter: Payer: Self-pay | Admitting: Family Medicine

## 2018-05-22 ENCOUNTER — Ambulatory Visit
Admission: RE | Admit: 2018-05-22 | Discharge: 2018-05-22 | Disposition: A | Discharge status: Home / Self Care | Payer: PPO | Source: Ambulatory Visit | Attending: Family Medicine | Admitting: Family Medicine

## 2018-05-22 ENCOUNTER — Ambulatory Visit (INDEPENDENT_AMBULATORY_CARE_PROVIDER_SITE_OTHER): Payer: PPO | Admitting: Family Medicine

## 2018-05-22 ENCOUNTER — Telehealth: Payer: Self-pay | Admitting: Family Medicine

## 2018-05-22 VITALS — BP 120/54 | HR 60 | Temp 98.2°F | Resp 16 | Ht 66.0 in | Wt 173.0 lb

## 2018-05-22 DIAGNOSIS — I509 Heart failure, unspecified: Secondary | ICD-10-CM | POA: Insufficient documentation

## 2018-05-22 DIAGNOSIS — I878 Other specified disorders of veins: Secondary | ICD-10-CM | POA: Diagnosis not present

## 2018-05-22 DIAGNOSIS — J90 Pleural effusion, not elsewhere classified: Secondary | ICD-10-CM | POA: Insufficient documentation

## 2018-05-22 DIAGNOSIS — R1907 Generalized intra-abdominal and pelvic swelling, mass and lump: Secondary | ICD-10-CM

## 2018-05-22 DIAGNOSIS — K862 Cyst of pancreas: Secondary | ICD-10-CM | POA: Diagnosis not present

## 2018-05-22 DIAGNOSIS — R160 Hepatomegaly, not elsewhere classified: Secondary | ICD-10-CM | POA: Diagnosis not present

## 2018-05-22 NOTE — Patient Instructions (Signed)
Discontinue Torsemide afternoon dose.  Take only in the morning

## 2018-05-22 NOTE — Telephone Encounter (Signed)
I did not call this patient.  Have now tried to call to let him know but got no answer. ED

## 2018-05-22 NOTE — Telephone Encounter (Signed)
Pt returned Elena's call.  Please call pt back asap.  Thanks, [employer]

## 2018-05-22 NOTE — Telephone Encounter (Signed)
Opened in error

## 2018-05-22 NOTE — Progress Notes (Signed)
Patient: [name] Male    DOB: [birth year]   84 y.o.   MRN: [id number] Visit Date: 05/22/2018  Today's Provider: Wilhemena Durie, MD   Chief Complaint  Patient presents with  . Follow-up   Subjective:    HPI  Patient was seen in the office 1 week ago c/o edema, shortness of breath, and abdominal pain. Chest X-ray on 05/15/18 was noted to have mild CHF. Patient reports that he is taking torsemide as prescribed. He reports that he had his abdominal US done this week (05/20/18).   He reports that the shortness of breath is a little better, but he does give out of breath when walking at times. He has lost 11lbs since he was seen in the office last week.   Patient also mentions that he has a scar on his right leg that is draining.     Allergies  Allergen Reactions  . Pollen Extract       Review of Systems  Constitutional: Positive for fatigue. Negative for activity change.  HENT: Negative.   Eyes: Negative.   Respiratory: Positive for shortness of breath. Negative for cough.   Cardiovascular: Negative for chest pain, palpitations and leg swelling.  Gastrointestinal: Negative.   Endocrine: Negative.   Musculoskeletal: Negative for arthralgias.  Skin: Positive for wound.  Allergic/Immunologic: Negative.   Neurological: Negative.  Negative for dizziness, light-headedness and headaches.  Psychiatric/Behavioral: Negative.     Social History   Tobacco Use  . Smoking status: Former Smoker    Last attempt to quit: 04/24/1975    Years since quitting: 43.1  . Smokeless tobacco: Never Used  Substance Use Topics  . Alcohol use: Yes    Alcohol/week: 11.0 - 22.0 standard drinks    Types: 4 - 8 Standard drinks or equivalent, 7 - 14 Shots of liquor per week    Comment: 1-2 liquor drinks a day   Objective:   BP (!) 120/54 (BP Location: Left Arm, Patient Position: Sitting, Cuff Size: Normal)   Pulse 60   Temp 98.2 F (36.8 C)   Resp 16   Ht 5\' 6"  (1.676 m)   Wt  173 lb (78.5 kg)   SpO2 96%   BMI 27.92 kg/m  Vitals:   05/22/18 1024  BP: (!) 120/54  Pulse: 60  Resp: 16  Temp: 98.2 F (36.8 C)  SpO2: 96%  Weight: 173 lb (78.5 kg)  Height: 5\' 6"  (1.676 m)     Physical Exam  Constitutional: He is oriented to person, place, and time. He appears well-developed and well-nourished.  HENT:  Head: Normocephalic and atraumatic.  Right Ear: External ear normal.  Left Ear: External ear normal.  Nose: Nose normal.  Eyes: Conjunctivae are normal. No scleral icterus.  Neck: No thyromegaly present.  Cardiovascular: Normal rate, regular rhythm and normal heart sounds.  Pulmonary/Chest: Effort normal and breath sounds normal.  Abdominal: Soft.  Musculoskeletal: He exhibits no edema.  Neurological: He is alert and oriented to person, place, and time.  Skin: Skin is warm and dry.  Psychiatric: He has a normal mood and affect. His behavior is normal. Judgment and thought content normal.        Assessment & Plan:     1. Generalized intra-abdominal and pelvic swelling, mass and lump More than 50% of 30 minute visit spent in counseling and coordination of care. - CT ABDOMEN W CONTRAST; Future  2. Congestive heart failure, unspecified HF chronicity, unspecified heart failure  type ([place]) Stop afternoon torosemide--start am Zaroxylyn. - Comprehensive metabolic panel - B Nat Peptide - DG Chest 2 View; Future  3. Liver mass Likely metastatic cancer. - Ambulatory referral to Oncology - [facility]; Future  4. Pancreatic cyst  - Ambulatory referral to Oncology - [place]; Future      I have done the exam and reviewed the above chart and it is accurate to the best of my knowledge. Development worker, community has been used in this note in any air is in the dictation or transcription are unintentional.  Wilhemena Durie, MD  [place]

## 2018-05-23 LAB — COMPREHENSIVE METABOLIC PANEL
ALBUMIN: 3.7 g/dL (ref 3.5–4.7)
ALT: 19 IU/L (ref 0–44)
AST: 76 IU/L — ABNORMAL HIGH (ref 0–40)
Albumin/Globulin Ratio: 1.5 (ref 1.2–2.2)
Alkaline Phosphatase: 156 IU/L — ABNORMAL HIGH (ref 39–117)
BILIRUBIN TOTAL: 1 mg/dL (ref 0.0–1.2)
BUN / CREAT RATIO: 21 (ref 10–24)
BUN: 40 mg/dL — AB (ref 8–27)
CHLORIDE: 96 mmol/L (ref 96–106)
CO2: 25 mmol/L (ref 20–29)
CREATININE: 1.95 mg/dL — AB (ref 0.76–1.27)
Calcium: 9.1 mg/dL (ref 8.6–10.2)
GFR calc non Af Amer: 30 mL/min/{1.73_m2} — ABNORMAL LOW (ref >59)
GFR, EST AFRICAN AMERICAN: 35 mL/min/{1.73_m2} — AB (ref >59)
GLUCOSE: 103 mg/dL — AB (ref 65–99)
Globulin, Total: 2.4 g/dL (ref 1.5–4.5)
Potassium: 3.3 mmol/L — ABNORMAL LOW (ref 3.5–5.2)
Sodium: 140 mmol/L (ref 134–144)
TOTAL PROTEIN: 6.1 g/dL (ref 6.0–8.5)

## 2018-05-23 LAB — BRAIN NATRIURETIC PEPTIDE: BNP: 513.5 pg/mL — AB (ref 0.0–100.0)

## 2018-05-24 ENCOUNTER — Inpatient Hospital Stay: Payer: PPO | Attending: Oncology | Admitting: Oncology

## 2018-05-24 ENCOUNTER — Other Ambulatory Visit: Payer: Self-pay

## 2018-05-24 ENCOUNTER — Encounter: Payer: Self-pay | Admitting: Oncology

## 2018-05-24 ENCOUNTER — Inpatient Hospital Stay: Payer: PPO

## 2018-05-24 VITALS — BP 135/64 | HR 58 | Temp 97.7°F | Resp 18 | Ht 65.0 in | Wt 171.4 lb

## 2018-05-24 DIAGNOSIS — E876 Hypokalemia: Secondary | ICD-10-CM

## 2018-05-24 DIAGNOSIS — R160 Hepatomegaly, not elsewhere classified: Secondary | ICD-10-CM | POA: Diagnosis not present

## 2018-05-24 DIAGNOSIS — R948 Abnormal results of function studies of other organs and systems: Secondary | ICD-10-CM | POA: Diagnosis not present

## 2018-05-24 DIAGNOSIS — I5020 Unspecified systolic (congestive) heart failure: Secondary | ICD-10-CM | POA: Diagnosis not present

## 2018-05-24 DIAGNOSIS — N183 Chronic kidney disease, stage 3 (moderate): Secondary | ICD-10-CM | POA: Insufficient documentation

## 2018-05-24 DIAGNOSIS — Z7901 Long term (current) use of anticoagulants: Secondary | ICD-10-CM | POA: Diagnosis not present

## 2018-05-24 DIAGNOSIS — I10 Essential (primary) hypertension: Secondary | ICD-10-CM | POA: Diagnosis not present

## 2018-05-24 DIAGNOSIS — K5900 Constipation, unspecified: Secondary | ICD-10-CM | POA: Insufficient documentation

## 2018-05-24 LAB — CBC WITH DIFFERENTIAL/PLATELET
ABS IMMATURE GRANULOCYTES: 0.02 10*3/uL (ref 0.00–0.07)
BASOS PCT: 1 %
Basophils Absolute: 0 10*3/uL (ref 0.0–0.1)
Eosinophils Absolute: 0 10*3/uL (ref 0.0–0.5)
Eosinophils Relative: 0 %
HCT: 38.1 % — ABNORMAL LOW (ref 39.0–52.0)
HEMOGLOBIN: 12.5 g/dL — AB (ref 13.0–17.0)
IMMATURE GRANULOCYTES: 0 %
LYMPHS PCT: 9 %
Lymphs Abs: 0.6 10*3/uL — ABNORMAL LOW (ref 0.7–4.0)
MCH: 29.3 pg (ref 26.0–34.0)
MCHC: 32.8 g/dL (ref 30.0–36.0)
MCV: 89.4 fL (ref 80.0–100.0)
MONO ABS: 0.8 10*3/uL (ref 0.1–1.0)
Monocytes Relative: 12 %
NEUTROS ABS: 5 10*3/uL (ref 1.7–7.7)
NEUTROS PCT: 78 %
PLATELETS: 163 10*3/uL (ref 150–400)
RBC: 4.26 MIL/uL (ref 4.22–5.81)
RDW: 13.4 % (ref 11.5–15.5)
WBC: 6.4 10*3/uL (ref 4.0–10.5)
nRBC: 0 % (ref 0.0–0.2)

## 2018-05-24 LAB — COMPREHENSIVE METABOLIC PANEL
ALBUMIN: 3.6 g/dL (ref 3.5–5.0)
ALT: 21 U/L (ref 0–44)
ANION GAP: 11 (ref 5–15)
AST: 80 U/L — AB (ref 15–41)
Alkaline Phosphatase: 145 U/L — ABNORMAL HIGH (ref 38–126)
BILIRUBIN TOTAL: 1.3 mg/dL — AB (ref 0.3–1.2)
BUN: 48 mg/dL — AB (ref 8–23)
CHLORIDE: 99 mmol/L (ref 98–111)
CO2: 29 mmol/L (ref 22–32)
Calcium: 9.1 mg/dL (ref 8.9–10.3)
Creatinine, Ser: 1.88 mg/dL — ABNORMAL HIGH (ref 0.61–1.24)
GFR calc Af Amer: 35 mL/min — ABNORMAL LOW (ref >60)
GFR, EST NON AFRICAN AMERICAN: 31 mL/min — AB (ref >60)
GLUCOSE: 122 mg/dL — AB (ref 70–99)
POTASSIUM: 3.3 mmol/L — AB (ref 3.5–5.1)
Sodium: 139 mmol/L (ref 135–145)
TOTAL PROTEIN: 6.3 g/dL — AB (ref 6.5–8.1)

## 2018-05-24 MED ORDER — POTASSIUM CHLORIDE CRYS ER 10 MEQ PO TBCR: 20.0000 meq | EXTENDED_RELEASE_TABLET | Freq: Every day | ORAL | 5 refills | Status: AC

## 2018-05-24 NOTE — Progress Notes (Signed)
Met with Mr. Insley following consult with Dr. Tasia Catchings. Introduced nurse navigator services and provided contact information for any future needs. Dr. Tasia Catchings is revaluating renal function today. I will call him regarding his lab results once they are available. Oncology Nurse Navigator Documentation  Navigator Location: [facility]Med Onc (05/24/18 1100)   )Navigator Encounter Type: Initial MedOnc (05/24/18 1100)                     Patient Visit Type: MedOnc;Initial (05/24/18 1100)                              Time Spent with Patient: 30 (05/24/18 1100)

## 2018-05-24 NOTE — Progress Notes (Signed)
kcl

## 2018-05-24 NOTE — Progress Notes (Signed)
Pt here for initial visit. Complains of constipaiton; no having had a bowel movement in 5 days. Pt has a cut to right leg that continues weeping.

## 2018-05-25 LAB — HEPATITIS PANEL, ACUTE
Hep A IgM: NEGATIVE
Hep B C IgM: NEGATIVE
Hepatitis B Surface Ag: NEGATIVE

## 2018-05-25 LAB — CANCER ANTIGEN 19-9: CA 19-9: 179 U/mL — ABNORMAL HIGH (ref 0–35)

## 2018-05-25 LAB — CEA: CEA: 2.4 ng/mL (ref 0.0–4.7)

## 2018-05-25 LAB — PSA: PROSTATIC SPECIFIC ANTIGEN: 1.79 ng/mL (ref 0.00–4.00)

## 2018-05-25 LAB — AFP TUMOR MARKER: AFP, SERUM, TUMOR MARKER: 2.3 ng/mL (ref 0.0–8.3)

## 2018-05-25 NOTE — Progress Notes (Signed)
Hematology/Oncology Consult  note [facility] [phone] [phone]   Patient Care Team: Jerrol Banana., MD as PCP - General (Unknown Physician Specialty) Minna Merritts, MD as Consulting Physician (Cardiology) Thelma Comp, [place] as Consulting Physician (Optometry) Lavonia Dana, MD as Consulting Physician (Internal Medicine) Royston Cowper, MD as Consulting Physician (Urology) Clent Jacks, RN as Registered Nurse  REFERRING PROVIDER: Jerrol Banana., MD CHIEF COMPLAINTS/REASON FOR VISIT:  Evaluation of liver mass.   HISTORY OF PRESENTING ILLNESS:  [name] is a  84 y.o.  male with PMH listed below who was referred to me for evaluation of liver mass.  Patient was recently evaluated by primary care physician Dr. Rosanna Randy for abdominal swelling. Ultrasound abdomen was done which showed markedly heterogeneous appearance of the liver with numerous solid hepatic concerning for diffuse metastatic involvement.  There is also gland versus tumor thrombus within the portal vein which is distended.  Recommend further evaluation. Small amount of ascites adjacent to the liver.' 9 mm cyst noted in the region of pancreatic body.  Patient reports feeling fatigue for the past couple of months.  History of skin squamous cell carcinoma. History of extensive heart disease including RBBB, hypertension, CAD, CHF atrial fibrillation, he takes Coumadin for chronic and poor ambulation and torsemide.  Reports feeling difficult to reduce his bilateral lower extremity edema recently.  He also reports having severe constipation recently.  Not using any laxatives stool softeners.  Appetite is poor. Denies any nausea vomiting, abdominal pain. He lives by himself.  He has 2 daughters 1 son.  One daughter lives in [place], one daughter lives in [place].  Son lives in [place]. He drinks 1-2 scotch every day.  Denies any history of  hepatitis.   Review of Systems  Constitutional: Positive for malaise/fatigue and weight loss. Negative for chills and fever.  HENT: Negative for nosebleeds and sore throat.   Eyes: Negative for double vision, photophobia and redness.  Respiratory: Negative for cough, shortness of breath and wheezing.   Cardiovascular: Negative for chest pain, palpitations and orthopnea.  Gastrointestinal: Positive for constipation. Negative for abdominal pain, blood in stool, nausea and vomiting.  Genitourinary: Negative for dysuria.  Musculoskeletal: Negative for back pain, myalgias and neck pain.  Skin: Negative for itching and rash.  Neurological: Negative for dizziness, tingling and tremors.  Endo/Heme/Allergies: Negative for environmental allergies. Does not bruise/bleed easily.  Psychiatric/Behavioral: Negative for depression.    MEDICAL HISTORY:  Past Medical History:  Diagnosis Date  . Atrial fibrillation (HCC)    coumadin therapy  . CHF (congestive heart failure) ([place])   . Coronary artery disease    native vessel  . Edema   . Hypertension   . RBBB (right bundle branch block)   . Squamous cell carcinoma    left hand & right ear     SURGICAL HISTORY: Past Surgical History:  Procedure Laterality Date  . APPENDECTOMY    . CARDIOVERSION  09/21/08  . CORONARY ARTERY BYPASS GRAFT    . HERNIA REPAIR    . MELANOMA EXCISION     back  . NEPHRECTOMY    . PROSTATE ABLATION     transurethral needle ablation  . SQUAMOUS CELL CARCINOMA EXCISION     left hand & right ear.     SOCIAL HISTORY: Social History   Socioeconomic History  . Marital status: Widowed    Spouse name: Not on file  . Number of children: 3  .  Years of education: Not on file  . Highest education level: Not on file  Occupational History  . Occupation: Charity fundraiser and Haematologist: RETIRED  Social Needs  . Financial resource strain: Not on file  . Food insecurity:    Worry: Not on file    Inability: Not  on file  . Transportation needs:    Medical: Not on file    Non-medical: Not on file  Tobacco Use  . Smoking status: Former Smoker    Packs/day: 1.00    Years: 25.00    Pack years: 25.00    Last attempt to quit: 04/24/1975    Years since quitting: 43.1  . Smokeless tobacco: Never Used  Substance and Sexual Activity  . Alcohol use: Yes    Comment: 2-3 ounces of scotch day  . Drug use: No  . Sexual activity: Not on file  Lifestyle  . Physical activity:    Days per week: Not on file    Minutes per session: Not on file  . Stress: Not on file  Relationships  . Social connections:    Talks on phone: Not on file    Gets together: Not on file    Attends religious service: Not on file    Active member of club or organization: Not on file    Attends meetings of clubs or organizations: Not on file    Relationship status: Not on file  . Intimate partner violence:    Fear of current or ex partner: Not on file    Emotionally abused: Not on file    Physically abused: Not on file    Forced sexual activity: Not on file  Other Topics Concern  . Not on file  Social History Narrative   He lives with his wife.          FAMILY HISTORY: Family History  Problem Relation Age of Onset  . Heart attack Father   . Arthritis Mother   . Cancer Sister        unknown  . Other Neg Hx        Gastrointestinal Malignancy    ALLERGIES:  is allergic to pollen extract.  MEDICATIONS:  Current Outpatient Medications  Medication Sig Dispense Refill  . acetaminophen (TYLENOL) 650 MG CR tablet Take 650 mg by mouth every 6 (six) hours as needed for pain.    . Ascorbic Acid (VITAMIN C) 500 MG tablet Take 500 mg by mouth daily.      . cloNIDine (CATAPRES) 0.2 MG tablet TAKE 1 TABLET (0.2 MG TOTAL) BY MOUTH 2 (TWO) TIMES DAILY. 180 tablet 3  . glucosamine-chondroitin 500-400 MG tablet Take 1 tablet by mouth daily.      . hydrALAZINE (APRESOLINE) 50 MG tablet TAKE ONE TABLET BY MOUTH THREE TIMES A DAY AS  NEEDED 90 tablet 2  . isosorbide mononitrate (IMDUR) 60 MG 24 hr tablet TAKE ONE TABLET BY MOUTH DAILY. THIS IS A CHANGE IN DOSE. PLEASE DISCONTINUE THE 30MG  TABLETS 30 tablet 10  . loratadine (CLARITIN) 10 MG tablet TAKE 1 TABLET BY MOUTH DAILY AS NEEDED 30 tablet 11  . metolazone (ZAROXOLYN) 5 MG tablet Take 1 tablet (5 mg total) by mouth daily. 30 tablet 3  . metoprolol succinate (TOPROL-XL) 25 MG 24 hr tablet Take 0.5 tablets (12.5 mg total) by mouth daily. 45 tablet 7  . Multiple Vitamin (MULTIVITAMIN) tablet Take 1 tablet by mouth daily.      . ONE TOUCH ULTRA TEST  test strip USE TO TEST BLOOD SUGAR ONCE DAILY AS DIRECTED 100 each 11  . potassium chloride (K-DUR,KLOR-CON) 10 MEQ tablet Take 2 tablets (20 mEq total) by mouth daily. 60 tablet 5  . quinapril (ACCUPRIL) 40 MG tablet TAKE 1 TABLET (40 MG TOTAL) BY MOUTH 2 (TWO) TIMES DAILY. 180 tablet 3  . simvastatin (ZOCOR) 10 MG tablet TAKE ONE TABLET BY MOUTH EVERY NIGHT AT BEDTIME 90 tablet 3  . Tamsulosin HCl (FLOMAX) 0.4 MG CAPS Take 0.4 mg by mouth daily.      Marland Kitchen torsemide (DEMADEX) 20 MG tablet Take 40 mg by mouth daily. PRN     . traZODone (DESYREL) 100 MG tablet TAKE ONE TABLET BY MOUTH EVERY NIGHT AT BEDTIME 72 tablet 2  . warfarin (COUMADIN) 5 MG tablet TAKE AS DIRECTED BY COUMADIN CLINIC 60 tablet 0   No current facility-administered medications for this visit.      PHYSICAL EXAMINATION: ECOG PERFORMANCE STATUS: 1 - Symptomatic but completely ambulatory Vitals:   05/24/18 1041  BP: 135/64  Pulse: (!) 58  Resp: 18  Temp: 97.7 F (36.5 C)   Filed Weights   05/24/18 1041  Weight: 171 lb 6.4 oz (77.7 kg)    Physical Exam  Constitutional: He is oriented to person, place, and time. No distress.  HENT:  Head: Normocephalic and atraumatic.  Mouth/Throat: Oropharynx is clear and moist.  Eyes: Pupils are equal, round, and reactive to light. EOM are normal. No scleral icterus.  Neck: Normal range of motion. Neck supple.    Cardiovascular: Normal rate, regular rhythm and normal heart sounds.  Pulmonary/Chest: Effort normal. No respiratory distress. He has no wheezes.  Abdominal: Soft. Bowel sounds are normal. He exhibits distension. He exhibits no mass. There is no tenderness.  Musculoskeletal: Normal range of motion. He exhibits no edema or deformity.  Neurological: He is alert and oriented to person, place, and time. No cranial nerve deficit. Coordination normal.  Skin: Skin is warm and dry. No rash noted. No erythema.  Psychiatric: He has a normal mood and affect. His behavior is normal. Thought content normal.     LABORATORY DATA:  I have reviewed the data as listed Lab Results  Component Value Date   WBC 6.4 05/24/2018   HGB 12.5 (L) 05/24/2018   HCT 38.1 (L) 05/24/2018   MCV 89.4 05/24/2018   PLT 163 05/24/2018   Recent Labs    04/30/18 0931 05/15/18 1215 05/22/18 1154 05/24/18 1140  NA 146* 142 140 139  K 3.8 3.8 3.3* 3.3*  CL 107* 101 96 99  CO2 23 24 25 29   GLUCOSE 97 103* 103* 122*  BUN 29* 27 40* 48*  CREATININE 1.58* 1.58* 1.95* 1.88*  CALCIUM 9.1 9.0 9.1 9.1  GFRNONAA 39* 39* 30* 31*  GFRAA 45* 45* 35* 35*  PROT 5.8*  --  6.1 6.3*  ALBUMIN 3.7 3.4* 3.7 3.6  AST 64*  --  76* 80*  ALT 15  --  19 21  ALKPHOS 122*  --  156* 145*  BILITOT 0.8  --  1.0 1.3*   Iron/TIBC/Ferritin/ %Sat No results found for: IRON, TIBC, FERRITIN, IRONPCTSAT      ASSESSMENT & PLAN:  1. Liver mass    Ultrasound of abdomen was independently reviewed by me and discussed with patient.  Concerning multiple liver mass, suspecting underlying malignancy Check CBC, CMP, CA-19-9, CEA, AFP, PSA, hepatitis panel. He has CT abdomen scheduled next Friday. Labs reviewed which showed mild AST elevation, bilirubin  elevation 1.3.  Creatinine level 1.8 with a estimated GFR 31.  Labs obtained today reviewed. CA 19.9 elevated 179, normal CEA, AFP, indicating biliary or pancreatic origin.  He has CT abdomen w  contrast scheduled next week. Stage 3 CKD with Crcl around 30.  Will change to PET scan.    Orders Placed This Encounter  Procedures  . NM PET Image Initial (PI) Skull Base To Thigh    Standing Status:   Future    Standing Expiration Date:   05/26/2019    Order Specific Question:   If indicated for the ordered procedure, I authorize the administration of a radiopharmaceutical per Radiology protocol    Answer:   Yes    Order Specific Question:   Preferred imaging location?    Answer:    [facility]    Order Specific Question:   Radiology Contrast Protocol - do NOT remove file path    Answer:   [email or web address]    Order Specific Question:   ** REASON FOR EXAM (FREE TEXT)    Answer:   multiple liver mass  . Cancer antigen 19-9    Standing Status:   Future    Number of Occurrences:   1    Standing Expiration Date:   05/25/2019  . CEA    Standing Status:   Future    Number of Occurrences:   1    Standing Expiration Date:   05/25/2019  . PSA    Standing Status:   Future    Number of Occurrences:   1    Standing Expiration Date:   05/25/2019  . CBC with Differential/Platelet    Standing Status:   Future    Number of Occurrences:   1    Standing Expiration Date:   05/25/2019  . AFP tumor marker    Standing Status:   Future    Number of Occurrences:   1    Standing Expiration Date:   05/25/2019  . Hepatitis panel, acute    Standing Status:   Future    Number of Occurrences:   1    Standing Expiration Date:   05/25/2019  . Comprehensive metabolic panel    Standing Status:   Future    Number of Occurrences:   1    Standing Expiration Date:   05/25/2019    All questions were answered. The patient knows to call the clinic with any problems questions or concerns.  Return of visit: to be determined.  Thank you for this kind referral and the opportunity to participate in the care of this patient. A copy of today's note is routed to referring provider    Total face to face encounter time for this patient visit was 45 min. >50% of the time was  spent in counseling and coordination of care.    Earlie Server, MD, PhD Hematology Oncology [facility] Pager- [phone] 05/25/2018

## 2018-05-27 ENCOUNTER — Telehealth: Payer: Self-pay

## 2018-05-27 ENCOUNTER — Ambulatory Visit: Payer: PPO | Admitting: Family Medicine

## 2018-05-27 ENCOUNTER — Ambulatory Visit: Payer: PPO

## 2018-05-27 NOTE — Telephone Encounter (Signed)
Called and notified [name] that due to his renal function we were going to have a PET scan performed instead of CT. Educated on PET scan and went over instructions for scan as well as date/time/place. Read back performed and all questions answered. Encouraged to call with any further needs. Oncology Nurse Navigator Documentation  Navigator Location: [facility] Onc (05/27/18 1600)   )Navigator Encounter Type: Telephone (05/27/18 1600) Telephone: Lahoma Crocker Call;Appt Confirmation/Clarification (05/27/18 1600)                                                  Time Spent with Patient: 15 (05/27/18 1600)

## 2018-05-28 ENCOUNTER — Telehealth: Payer: Self-pay

## 2018-05-28 NOTE — Telephone Encounter (Signed)
Pt declined scheduling the AWV this year. Pt states he has a lot going on right now with his health and does not feel it is appropriate this year.  -MM

## 2018-05-30 ENCOUNTER — Encounter
Admission: RE | Admit: 2018-05-30 | Discharge: 2018-05-30 | Disposition: A | Discharge status: Home / Self Care | Payer: PPO | Source: Ambulatory Visit | Attending: Oncology | Admitting: Oncology

## 2018-05-30 DIAGNOSIS — R160 Hepatomegaly, not elsewhere classified: Secondary | ICD-10-CM | POA: Diagnosis not present

## 2018-05-30 DIAGNOSIS — C787 Secondary malignant neoplasm of liver and intrahepatic bile duct: Secondary | ICD-10-CM | POA: Diagnosis not present

## 2018-05-30 DIAGNOSIS — C801 Malignant (primary) neoplasm, unspecified: Secondary | ICD-10-CM | POA: Diagnosis not present

## 2018-05-30 LAB — GLUCOSE, CAPILLARY: Glucose-Capillary: 82 mg/dL (ref 70–99)

## 2018-05-30 MED ORDER — FLUDEOXYGLUCOSE F - 18 (FDG) INJECTION
8.9000 | Freq: Once | INTRAVENOUS | Status: AC | PRN
  Administered 2018-05-30: 8.55 via INTRAVENOUS

## 2018-05-31 ENCOUNTER — Ambulatory Visit: Payer: PPO

## 2018-05-31 ENCOUNTER — Other Ambulatory Visit: Payer: Self-pay | Admitting: Oncology

## 2018-05-31 ENCOUNTER — Telehealth: Payer: Self-pay

## 2018-05-31 DIAGNOSIS — R160 Hepatomegaly, not elsewhere classified: Secondary | ICD-10-CM

## 2018-05-31 NOTE — Progress Notes (Signed)
US

## 2018-05-31 NOTE — Telephone Encounter (Signed)
Acceptable risk to stop warfarin 5 days prior to biopsy Would restart warfarin following procedure No prior stroke history, should not require bridging  For shortness of breath May benefit from thoracentesis on left especially given findings on PET scan (Large effusion) Perhaps could be sent for cytology  Given worsening renal function on torsemide metolazone Could cut back on some diuretics Shortness of breath may improve with above  Lower extremity edema or weeping leg wound may be secondary to extension of tumor into portal vessels  thx TGollan

## 2018-05-31 NOTE — Telephone Encounter (Signed)
Voicemail left with [name] to return call. Would like to notify him of his PET scan results and arrange U/S guided liver biopsy. We will reach out to Dr. [name] for permission and recommendations for holding his coumadin prior to his biopsy.  Oncology Nurse Navigator Documentation  Navigator Location: [facility] (05/31/18 1500)   )Navigator Encounter Type: Telephone (05/31/18 1500) Telephone: Outgoing Call (05/31/18 1500)                                                  Time Spent with Patient: 15 (05/31/18 1500)

## 2018-05-31 NOTE — Telephone Encounter (Signed)
Thank you Dr. [name]. I have checked with IR and they cannot perform a biopsy and thoracentesis on the same day. He is not required to be off anticoagulation for thoracentesis, so we could get thoracentesis early next week followed by biopsy if needed. Dr. Tasia Catchings what are your thoughts?

## 2018-05-31 NOTE — Telephone Encounter (Signed)
Mr. Bertsch and his daughter, Tye [place], returned call. Went over results of PET scan with them and the recommendation for U/S guided biopsy. Educated further on liver biopsy. He would like to proceed with biopsy. Invasive checklist sent to special scheduling. He reports he always has shortness of breath due to his CHF.  Educated further on tumor thrombus, pleural effusions and ascites.  IMPRESSION: 1. Extensive hypermetabolic metastasis in the RIGHT hepatic lobe. 2. Tumor extension into the main and LEFT portal veins with hypermetabolic tumor thrombus. 3. Bilateral small hypermetabolic pulmonary nodules consistent with pulmonary metastasis. One nodule in each lung. 4. Bilateral moderate pleural effusions.  Small volume ascites.  Oncology Nurse Navigator Documentation  Navigator Location: [facility] (05/31/18 1554)   )Navigator Encounter Type: Telephone (05/31/18 1554) Telephone: Incoming Call (05/31/18 1554)                                                  Time Spent with Patient: 15 (05/31/18 1554)

## 2018-06-02 ENCOUNTER — Encounter: Payer: Self-pay | Admitting: Oncology

## 2018-06-02 DIAGNOSIS — Z7189 Other specified counseling: Secondary | ICD-10-CM | POA: Insufficient documentation

## 2018-06-03 ENCOUNTER — Telehealth: Payer: Self-pay

## 2018-06-03 ENCOUNTER — Other Ambulatory Visit: Payer: Self-pay

## 2018-06-03 DIAGNOSIS — R0602 Shortness of breath: Secondary | ICD-10-CM

## 2018-06-03 DIAGNOSIS — J90 Pleural effusion, not elsewhere classified: Secondary | ICD-10-CM

## 2018-06-03 DIAGNOSIS — R160 Hepatomegaly, not elsewhere classified: Secondary | ICD-10-CM

## 2018-06-03 NOTE — Telephone Encounter (Signed)
Spoke with Dr. Tasia Catchings. We have ordered thoracentesis of left sided pleural effusion. Will send fluid for cytology. Educated further on thoracentesis. We are also planning to arrange for liver biopsy later in the week. If thoracentesis is diagnostic we can cancel liver biopsy. [name] is agreeable to plan. He will see Dr. Rosanna Randy on 10/30 and has follow up with Dr. Rolly Salter 11/6 (nephrology). He has had multiple labs done this month and he will contact Dr. Acquanetta Sit office to see if he can cancel upcoming labs to avoid duplication. He also has a coumadin clinic appointment this week. If his coumadin is held for biopsy he will call and see if he should reschedule as his levels will not be accurate. Oncology Nurse Navigator Documentation  Navigator Location: [facility] (06/03/18 1000)   )Navigator Encounter Type: Telephone (06/03/18 1000) Telephone: Outgoing Call;Patient Update;Education (06/03/18 1000)                                                  Time Spent with Patient: 30 (06/03/18 1000)

## 2018-06-03 NOTE — Telephone Encounter (Signed)
error 

## 2018-06-03 NOTE — Discharge Instructions (Signed)
Thoracentesis, Care After °Refer to this sheet in the next few weeks. These instructions provide you with information about caring for yourself after your procedure. Your health care provider may also give you more specific instructions. Your treatment has been planned according to current medical practices, but problems sometimes occur. Call your health care provider if you have any problems or questions after your procedure. °What can I expect after the procedure? °After your procedure, it is common to have pain at the puncture site. °Follow these instructions at home: °· Take medicines only as directed by your health care provider. °· You may return to your normal diet and normal activities as directed by your health care provider. °· Drink enough fluid to keep your urine clear or pale yellow. °· Do not take baths, swim, or use a hot tub until your health care provider approves. °· Follow your health care provider's instructions about: °? Puncture site care. °? Bandage (dressing) changes and removal. °· Check your puncture site every day for signs of infection. Watch for: °? Redness, swelling, or pain. °? Fluid, blood, or pus. °· Keep all follow-up visits as directed by your health care provider. This is important. °Contact a health care provider if: °· You have redness, swelling, or pain at your puncture site. °· You have fluid, blood, or pus coming from your puncture site. °· You have a fever. °· You have chills. °· You have nausea or vomiting. °· You have trouble breathing. °· You develop a worsening cough. °Get help right away if: °· You have extreme shortness of breath. °· You develop chest pain. °· You faint or feel light-headed. °This information is not intended to replace advice given to you by your health care provider. Make sure you discuss any questions you have with your health care provider. °Document Released: 08/14/2014 Document Revised: 03/25/2016 Document Reviewed: 05/05/2014 °Elsevier  Interactive Patient Education © 2018 [employer] Inc. ° °

## 2018-06-03 NOTE — Telephone Encounter (Signed)
Called and notified [name] with appointment details for thoracentesis. Arrival time of 1400 for 1430 appointment [facility]. He takes his coumadin in the evening so he has not taken a dose today. He is waiting to hear from [place] regarding liver biopsy prior to taking dose today. Oncology Nurse Navigator Documentation  Navigator Location: [facility] (06/03/18 1200)   )Navigator Encounter Type: Telephone (06/03/18 1200) Telephone: Lahoma Crocker Call;Appt Confirmation/Clarification (06/03/18 1200)                                                  Time Spent with Patient: 15 (06/03/18 1200)

## 2018-06-04 ENCOUNTER — Ambulatory Visit
Admission: RE | Admit: 2018-06-04 | Discharge: 2018-06-04 | Disposition: A | Discharge status: Home / Self Care | Payer: PPO | Source: Ambulatory Visit | Attending: Oncology | Admitting: Oncology

## 2018-06-04 ENCOUNTER — Ambulatory Visit
Admission: RE | Admit: 2018-06-04 | Discharge: 2018-06-04 | Disposition: A | Discharge status: Home / Self Care | Payer: PPO | Source: Ambulatory Visit | Attending: Interventional Radiology | Admitting: Interventional Radiology

## 2018-06-04 ENCOUNTER — Telehealth: Payer: Self-pay | Admitting: Cardiovascular Disease

## 2018-06-04 DIAGNOSIS — Z9889 Other specified postprocedural states: Secondary | ICD-10-CM | POA: Diagnosis not present

## 2018-06-04 DIAGNOSIS — J90 Pleural effusion, not elsewhere classified: Secondary | ICD-10-CM | POA: Diagnosis not present

## 2018-06-04 DIAGNOSIS — R160 Hepatomegaly, not elsewhere classified: Secondary | ICD-10-CM | POA: Insufficient documentation

## 2018-06-04 DIAGNOSIS — R0602 Shortness of breath: Secondary | ICD-10-CM | POA: Diagnosis not present

## 2018-06-04 LAB — AMYLASE, PLEURAL OR PERITONEAL FLUID: Amylase, Fluid: 36 U/L

## 2018-06-04 LAB — BODY FLUID CELL COUNT WITH DIFFERENTIAL
Eos, Fluid: 0 %
Lymphs, Fluid: 81 %
Monocyte-Macrophage-Serous Fluid: 18 %
Neutrophil Count, Fluid: 1 %
Other Cells, Fluid: 0 %
WBC FLUID: 25 uL

## 2018-06-04 LAB — LACTATE DEHYDROGENASE, PLEURAL OR PERITONEAL FLUID: LD FL: 62 U/L — AB (ref 3–23)

## 2018-06-04 LAB — ALBUMIN, PLEURAL OR PERITONEAL FLUID: Albumin, Fluid: 1.5 g/dL

## 2018-06-04 LAB — PROTEIN, PLEURAL OR PERITONEAL FLUID

## 2018-06-04 NOTE — Telephone Encounter (Signed)
Returned call to the pt and he stated he is having a liver biopsy on Friday, 06/07/18, as he has been diagnosed with cancer. He stated he took his last dose of Coumadin yesterday per Dr. Rosanna Randy. Pt asked when he could resume his Coumadin and instructed pt the MD should instruct him to do so that evening after procedure at his regular dose unless the MD states otherwise. Advised pt to call back if there is a delay in restarting the dose or if he has any issues to call back and he verbalized understanding.

## 2018-06-04 NOTE — Telephone Encounter (Signed)
Pt c/o medication issue:  1. Name of Medication: coumadin  2. How are you currently taking this medication (dosage and times per day)? Holding starting today for biopsy   3. Are you having a reaction (difficulty breathing--STAT)?  No   4. What is your medication issue? Patient not sure if he needs to keep appt 10/30 in [place] coumadin clinic.    Please call to advise

## 2018-06-05 ENCOUNTER — Ambulatory Visit (INDEPENDENT_AMBULATORY_CARE_PROVIDER_SITE_OTHER): Payer: PPO | Admitting: Family Medicine

## 2018-06-05 ENCOUNTER — Telehealth: Payer: Self-pay

## 2018-06-05 ENCOUNTER — Ambulatory Visit: Payer: Self-pay

## 2018-06-05 ENCOUNTER — Encounter: Payer: Self-pay | Admitting: Family Medicine

## 2018-06-05 VITALS — BP 104/50 | HR 58 | Temp 98.1°F | Resp 18 | Ht 65.0 in | Wt 165.0 lb

## 2018-06-05 DIAGNOSIS — I25118 Atherosclerotic heart disease of native coronary artery with other forms of angina pectoris: Secondary | ICD-10-CM | POA: Diagnosis not present

## 2018-06-05 DIAGNOSIS — Z7189 Other specified counseling: Secondary | ICD-10-CM

## 2018-06-05 DIAGNOSIS — N183 Chronic kidney disease, stage 3 unspecified: Secondary | ICD-10-CM

## 2018-06-05 DIAGNOSIS — E782 Mixed hyperlipidemia: Secondary | ICD-10-CM

## 2018-06-05 DIAGNOSIS — C229 Malignant neoplasm of liver, not specified as primary or secondary: Secondary | ICD-10-CM | POA: Diagnosis not present

## 2018-06-05 DIAGNOSIS — I5032 Chronic diastolic (congestive) heart failure: Secondary | ICD-10-CM

## 2018-06-05 DIAGNOSIS — R609 Edema, unspecified: Secondary | ICD-10-CM

## 2018-06-05 LAB — PH, BODY FLUID: pH, Body Fluid: 7.8

## 2018-06-05 LAB — PROTEIN, BODY FLUID (OTHER): TOTAL PROTEIN, BODY FLUID OTHER: 2.3 g/dL

## 2018-06-05 NOTE — Telephone Encounter (Signed)
Called and spoke with Mr. Gallentine this am. He reports he did not tolerate the thoracentesis. States he felt like he could not breathe after and he had chest discomfort throughout the night. Reports his breathing if fine this am and now he is worried he will not tolerate the liver biopsy very well. His liver biopsy is scheduled for this Friday, 06/07/18, with the arrival time of 0800. He has remained off coumadin. His daughter will bring him to this appointment. He is seeing Dr. Rosanna Randy today. Instructed that the RN from special procedures will call him with further instructions for liver biopsy.  Oncology Nurse Navigator Documentation  Navigator Location: [facility] (06/05/18 1100)   )Navigator Encounter Type: Telephone (06/05/18 1100) Telephone: [place] Call (06/05/18 1100)                                                  Time Spent with Patient: 15 (06/05/18 1100)

## 2018-06-05 NOTE — Progress Notes (Signed)
[name]  MRN: [id number] DOB: Aug 17, 1927  Subjective:  HPI   Patient is an 84 year old male who presents for follow up of his congestive heart failure.  He was last seen on 05/22/18 and was advised to discontinue Torsemide afternoon dose, and to continue to take only in the morning. He was also advised to start potassium 2 tablets daily, and he reports that he is tolerating it well.   He reports that he still has shortness of breath that has improved slightly. He describes it as difficulty taking a deep breath.   He now has known metastatic cancer.Has seen Oncology. Patient Active Problem List   Diagnosis Date Noted  . Goals of care, counseling/discussion 06/02/2018  . Actinic keratoses 12/10/2014  . Allergic rhinitis 12/10/2014  . A-fib ([place]) 12/10/2014  . CCF (congestive cardiac failure) ([place]) 12/10/2014  . Acid reflux 12/10/2014  . HLD (hyperlipidemia) 12/10/2014  . BP (high blood pressure) 12/10/2014  . Arthritis, degenerative 12/10/2014  . Diabetes mellitus, type 2 ([place]) 12/10/2014  . Chronic renal insufficiency 03/05/2014  . Status post nephrectomy 03/05/2014  . Chronic diastolic CHF (congestive heart failure) ([place]) 10/13/2013  . Pre-op evaluation 09/17/2013  . Permanent atrial fibrillation 09/17/2013  . Long term current use of anticoagulant 10/25/2010  . Shortness of breath 11/01/2009  . Atherosclerosis of native coronary artery of native heart with stable angina pectoris ([place]) 10/22/2009  . HYPERTENSION, BENIGN 03/09/2009  . RBBB 12/29/2008  . EDEMA 12/29/2008    Past Medical History:  Diagnosis Date  . Atrial fibrillation (HCC)    coumadin therapy  . CHF (congestive heart failure) ([place])   . Coronary artery disease    native vessel  . Edema   . Hypertension   . RBBB (right bundle branch block)   . 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 Marital status: Widowed    Spouse name: Not on file    . Number of children: 3  . Years of education: Not on file  . Highest education level: Not on file  Occupational History  . Occupation: Charity fundraiser and Haematologist: RETIRED  Social Needs  . Financial resource strain: Not on file  . Food insecurity:    Worry: Not on file    Inability: Not on file  . Transportation needs:    Medical: Not on file    Non-medical: Not on file  Tobacco Use  . Smoking status: Former Smoker    Packs/day: 1.00    Years: 25.00    Pack years: 25.00    Last attempt to quit: 04/24/1975    Years since quitting: 43.1  . Smokeless tobacco: Never Used  Substance and Sexual Activity  . Alcohol use: Yes    Comment: 2-3 ounces of scotch day  . Drug use: No  . Sexual activity: Not on file  Lifestyle  . Physical activity:    Days per week: Not on file    Minutes per session: Not on file  . Stress: Not on file  Relationships  . Social connections:    Talks on phone: Not on file    Gets together: Not on file    Attends religious service: Not on file    Active member of club or organization: Not on file    Attends meetings of clubs or organizations: Not on file    Relationship status: Not on  file  . Intimate partner violence:    Fear of current or ex partner: Not on file    Emotionally abused: Not on file    Physically abused: Not on file    Forced sexual activity: Not on file  Other Topics Concern  . Not on file  Social History Narrative   He lives with his wife.            Allergies  Allergen Reactions  . Pollen Extract     Review of Systems  Constitutional: Positive for malaise/fatigue.  HENT: Negative.   Eyes: Negative.   Respiratory: Positive for shortness of breath.   Cardiovascular: Negative.   Gastrointestinal:       Bloating.  Genitourinary: Negative.   Skin: Negative.   Endo/Heme/Allergies: Negative.   Psychiatric/Behavioral: Negative.     Objective:  BP (!) 104/50 (BP Location: Left Arm, Patient Position:  Sitting, Cuff Size: Normal)   Pulse (!) 58   Temp 98.1 F (36.7 C)   Resp 18   Ht _0  (1.651 m)   Wt 165 lb (74.8 kg)   SpO2 96%   BMI 27.46 kg/m   Physical Exam  Constitutional: He is oriented to person, place, and time and well-developed, well-nourished, and in no distress.  HENT:  Head: Normocephalic and atraumatic.  Right Ear: External ear normal.  Left Ear: External ear normal.  Nose: Nose normal.  Eyes: Conjunctivae are normal. No scleral icterus.  Neck: No JVD present. No thyromegaly present.  Cardiovascular: Normal rate, regular rhythm and normal heart sounds.  Pulmonary/Chest: Effort normal and breath sounds normal.  Abdominal: Soft.  Musculoskeletal: He exhibits edema.  Neurological: He is alert and oriented to person, place, and time. Gait normal. GCS score is 15.  Skin: Skin is warm and dry.  Psychiatric: Mood, memory, affect and judgment normal.    Assessment and Plan :    1. Malignant neoplasm of liver, unspecified liver malignancy type ([place])  - traMADol (ULTRAM) 50 MG tablet; Take 1 tablet (50 mg total) by mouth every 6 (six) hours as needed. (Patient not taking: Reported on 06/07/2018)  Dispense: 100 tablet; Refill: 0 - Ambulatory referral to Chronic Care Management Services  2. Chronic diastolic CHF (congestive heart failure) (HCC) Cut Torsemide from BID to q am--continue [place]oxy[place].  3. Atherosclerosis of native coronary artery of native heart with stable angina pectoris ([place])   4. Chronic renal impairment, stage 3 (moderate) (HCC)   5. Edema, unspecified type Improved. Both CHF and Liver met probably contributing.   HPI, Exam and A&P Transcribed under the direction and in the presence of Miguel Aschoff, Brooke Bonito., MD. Electronically Signed: Althea Charon, RMA I have done the exam and reviewed the chart and it is accurate to the best of my knowledge. Development worker, community has been used and  any errors in dictation or transcription are  unintentional. Miguel Aschoff M.D. [facility]

## 2018-06-05 NOTE — Patient Instructions (Signed)
1. Call the CCM (Chronic Care Management Team) with any additional questions regarding offered services. It was a pleasure meeting you today!!   Mr. [name] was given information about Chronic Care Management services today including:  1. CCM service includes personalized support from designated clinical staff supervised by his physician, including individualized plan of care and coordination with other care providers 2. 24/7 contact phone numbers for assistance for urgent and routine care needs. 3. Service will only be billed when office clinical staff spend 20 minutes or more in a month to coordinate care. 4. Only one practitioner may furnish and bill the service in a calendar month. 5. The patient may stop CCM services at any time (effective at the end of the month) by phone call to the office staff. 6. The patient will be responsible for cost sharing (co-pay) of up to 20% of the service fee (after annual deductible is met).  Patient agreed to services and verbal consent obtained.  CCM (Chronic Care Management) Team   Trish Fountain RN, BSN Nurse Care Coordinator  [phone]  Ruben Reason PharmD  Clinical Pharmacist  [phone]

## 2018-06-05 NOTE — Chronic Care Management (AMB) (Signed)
[name] is a 84 y.o. year old male who sees Jerrol Banana., MD for primary care. Dr. Rosanna Randy asked the CCM team to consult the patient for assistance with chronic disease management/care coordinations needs and support related to a new diagnosis of liver cancer. Referral was placed 06/05/18. I met with [name] face to face and discussed the CCM services.  [name] is currently established with the Oncology team at [facility] and speaks of a newly established relationship with the RN Nurse Navigator Mathis Fare. [name] is receptive to CCM services however he would like to wait until a plan of care is established by oncology (this will be done after his biopsy on Friday 06/07/18)  [name] was given information about Chronic Care Management services today including:  1. CCM service includes personalized support from designated clinical staff supervised by his physician, including individualized plan of care and coordination with other care providers 2. 24/7 contact phone numbers for assistance for urgent and routine care needs. 3. Service will only be billed when office clinical staff spend 20 minutes or more in a month to coordinate care. 4. Only one practitioner may furnish and bill the service in a calendar month. 5. The patient may stop CCM services at any time (effective at the end of the month) by phone call to the office staff. 6. The patient will be responsible for cost sharing (co-pay) of up to 20% of the service fee (after annual deductible is met).  Patient agreed to services and verbal consent obtained.   Plan: CCM team will reach out to [name] in 2 weeks to assist with CCM needs at that time.   Malichi Palardy E. Rollene Rotunda, RN, BSN Nurse Care Coordinator [facility] [phone]

## 2018-06-06 ENCOUNTER — Telehealth: Payer: Self-pay | Admitting: Family Medicine

## 2018-06-06 ENCOUNTER — Other Ambulatory Visit: Payer: Self-pay | Admitting: Radiology

## 2018-06-06 MED ORDER — TRAMADOL HCL 50 MG PO TABS: 50.0000 mg | ORAL_TABLET | Freq: Four times a day (QID) | ORAL | 0 refills | Status: DC | PRN

## 2018-06-06 NOTE — Telephone Encounter (Signed)
Dr Rosanna Randy to sign off on the order.  Patient advised  ED

## 2018-06-06 NOTE — Telephone Encounter (Signed)
Pt checking on the pain medication he had discussed with Dr. Rosanna Randy on last office visit. He stated the tylenol is not helping with the pain that was originally recommended from Dr. Rosanna Randy. Pt wants a call back on the status of the medication request.  Thanks, [place]

## 2018-06-07 ENCOUNTER — Ambulatory Visit: Admission: RE | Admit: 2018-06-07 | Payer: PPO | Source: Ambulatory Visit

## 2018-06-07 ENCOUNTER — Telehealth: Payer: Self-pay

## 2018-06-07 ENCOUNTER — Ambulatory Visit
Admission: RE | Admit: 2018-06-07 | Discharge: 2018-06-07 | Disposition: A | Discharge status: Home / Self Care | Payer: PPO | Source: Ambulatory Visit | Attending: Oncology | Admitting: Oncology

## 2018-06-07 ENCOUNTER — Other Ambulatory Visit: Payer: Self-pay

## 2018-06-07 DIAGNOSIS — R160 Hepatomegaly, not elsewhere classified: Secondary | ICD-10-CM

## 2018-06-07 DIAGNOSIS — I110 Hypertensive heart disease with heart failure: Secondary | ICD-10-CM | POA: Diagnosis not present

## 2018-06-07 DIAGNOSIS — Z905 Acquired absence of kidney: Secondary | ICD-10-CM | POA: Insufficient documentation

## 2018-06-07 DIAGNOSIS — R0989 Other specified symptoms and signs involving the circulatory and respiratory systems: Secondary | ICD-10-CM | POA: Insufficient documentation

## 2018-06-07 DIAGNOSIS — Z8249 Family history of ischemic heart disease and other diseases of the circulatory system: Secondary | ICD-10-CM | POA: Diagnosis not present

## 2018-06-07 DIAGNOSIS — C787 Secondary malignant neoplasm of liver and intrahepatic bile duct: Secondary | ICD-10-CM | POA: Insufficient documentation

## 2018-06-07 DIAGNOSIS — Z7901 Long term (current) use of anticoagulants: Secondary | ICD-10-CM | POA: Insufficient documentation

## 2018-06-07 DIAGNOSIS — I509 Heart failure, unspecified: Secondary | ICD-10-CM | POA: Insufficient documentation

## 2018-06-07 DIAGNOSIS — Z87891 Personal history of nicotine dependence: Secondary | ICD-10-CM | POA: Insufficient documentation

## 2018-06-07 DIAGNOSIS — Z951 Presence of aortocoronary bypass graft: Secondary | ICD-10-CM | POA: Insufficient documentation

## 2018-06-07 DIAGNOSIS — Z79899 Other long term (current) drug therapy: Secondary | ICD-10-CM | POA: Insufficient documentation

## 2018-06-07 DIAGNOSIS — K7689 Other specified diseases of liver: Secondary | ICD-10-CM | POA: Diagnosis not present

## 2018-06-07 DIAGNOSIS — Z9889 Other specified postprocedural states: Secondary | ICD-10-CM | POA: Insufficient documentation

## 2018-06-07 DIAGNOSIS — J90 Pleural effusion, not elsewhere classified: Secondary | ICD-10-CM | POA: Diagnosis not present

## 2018-06-07 DIAGNOSIS — Z85820 Personal history of malignant melanoma of skin: Secondary | ICD-10-CM | POA: Insufficient documentation

## 2018-06-07 DIAGNOSIS — I4891 Unspecified atrial fibrillation: Secondary | ICD-10-CM | POA: Diagnosis not present

## 2018-06-07 DIAGNOSIS — R7303 Prediabetes: Secondary | ICD-10-CM | POA: Diagnosis not present

## 2018-06-07 DIAGNOSIS — C228 Malignant neoplasm of liver, primary, unspecified as to type: Secondary | ICD-10-CM | POA: Diagnosis not present

## 2018-06-07 HISTORY — DX: Prediabetes: R73.03

## 2018-06-07 LAB — CBC
HCT: 34.3 % — ABNORMAL LOW (ref 39.0–52.0)
Hemoglobin: 11.2 g/dL — ABNORMAL LOW (ref 13.0–17.0)
MCH: 29.9 pg (ref 26.0–34.0)
MCHC: 32.7 g/dL (ref 30.0–36.0)
MCV: 91.5 fL (ref 80.0–100.0)
PLATELETS: 164 10*3/uL (ref 150–400)
RBC: 3.75 MIL/uL — AB (ref 4.22–5.81)
RDW: 13.6 % (ref 11.5–15.5)
WBC: 7.1 10*3/uL (ref 4.0–10.5)
nRBC: 0 % (ref 0.0–0.2)

## 2018-06-07 LAB — CYTOLOGY - NON PAP

## 2018-06-07 LAB — GLUCOSE, CAPILLARY: Glucose-Capillary: 91 mg/dL (ref 70–99)

## 2018-06-07 LAB — PROTIME-INR
INR: 1.46
PROTHROMBIN TIME: 17.6 s — AB (ref 11.4–15.2)

## 2018-06-07 LAB — APTT: aPTT: 41 seconds — ABNORMAL HIGH (ref 24–36)

## 2018-06-07 MED ORDER — SODIUM CHLORIDE 0.9 % IV SOLN
INTRAVENOUS | Status: DC
  Administered 2018-06-07: 10:00:00 via INTRAVENOUS

## 2018-06-07 MED ORDER — FENTANYL CITRATE (PF) 100 MCG/2ML IJ SOLN
INTRAMUSCULAR | Status: AC | PRN
  Administered 2018-06-07: 25 ug via INTRAVENOUS

## 2018-06-07 MED ORDER — MIDAZOLAM HCL 5 MG/5ML IJ SOLN
INTRAMUSCULAR | Status: AC | PRN
  Administered 2018-06-07: 0.5 mg via INTRAVENOUS

## 2018-06-07 MED ORDER — MIDAZOLAM HCL 5 MG/5ML IJ SOLN
INTRAMUSCULAR | Status: AC
  Filled 2018-06-07: qty 5

## 2018-06-07 MED ORDER — FENTANYL CITRATE (PF) 100 MCG/2ML IJ SOLN
INTRAMUSCULAR | Status: AC
  Filled 2018-06-07: qty 4

## 2018-06-07 NOTE — Telephone Encounter (Signed)
Called and spoke with Mr. Loflin. He is, at this time, leaving the hospital from his liver biopsy. He feels pretty good right now. I will call him back on Monday with his appointment to see Dr. Tasia Catchings for his biopsy results. Notified that his pleural fluid was negative.  DIAGNOSIS:  A. PLEURAL FLUID, LEFT; ULTRASOUND-GUIDED THORACENTESIS:  - NEGATIVE FOR MALIGNANCY.  - VERY LOW CELLULARITY SPECIMEN WITH LYMPHOCYTES, MACROPHAGES, AND A  RARE NEUTROPHIL   Oncology Nurse Navigator Documentation  Navigator Location: [facility] (06/07/18 1400)   )Navigator Encounter Type: Telephone (06/07/18 1400) Telephone: Lahoma Crocker Call;Appt Confirmation/Clarification (06/07/18 1400)                                                  Time Spent with Patient: 15 (06/07/18 1400)

## 2018-06-07 NOTE — H&P (Signed)
Chief Complaint:    Liver mets   Referring Physician(s): Yu,Zhou    Patient Status: [facility] - Out-pt  History of Present Illness: [name] is a 85 y.o. male with liver mets by imaging.  Remote h/o SCCa of the skin.  Here today for US liver bx  No new complaints   Past Medical History:  Diagnosis Date  . Atrial fibrillation (HCC)    coumadin therapy  . CHF (congestive heart failure) ([place])   . Coronary artery disease    native vessel  . Edema   . Hypertension   . Pre-diabetes   . RBBB (right bundle branch block)   . 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 APPENDECTOMY    . CARDIOVERSION  09/21/08  . CORONARY ARTERY BYPASS GRAFT    . HERNIA REPAIR    . MELANOMA EXCISION     back  . NEPHRECTOMY    . PROSTATE ABLATION     transurethral needle ablation  . SQUAMOUS CELL CARCINOMA EXCISION     left hand & right ear.     Allergies: Pollen extract  Medications: Prior to Admission medications   Medication Sig Start Date End Date Taking? Authorizing Provider  acetaminophen (TYLENOL) 650 MG CR tablet Take 650 mg by mouth every 6 (six) hours as needed for pain.   Yes [provider]  Ascorbic Acid (VITAMIN C) 500 MG tablet Take 500 mg by mouth daily.     Yes [provider]  cloNIDine (CATAPRES) 0.2 MG tablet TAKE 1 TABLET (0.2 MG TOTAL) BY MOUTH 2 (TWO) TIMES DAILY. 11/06/17  Yes Gollan, Kathlene November, MD  glucosamine-chondroitin 500-400 MG tablet Take 1 tablet by mouth daily.     Yes [provider]  hydrALAZINE (APRESOLINE) 50 MG tablet TAKE ONE TABLET BY MOUTH THREE TIMES A DAY AS NEEDED 10/05/17  Yes Gollan, Kathlene November, MD  isosorbide mononitrate (IMDUR) 60 MG 24 hr tablet TAKE ONE TABLET BY MOUTH DAILY. THIS IS A CHNGE IN DOSE. PLEASE DISCONTINUE THE 30MG  TABLETS 01/28/18  Yes Jerrol Banana., MD  loratadine (CLARITIN) 10 MG tablet TAKE 1 TABLET BY MOUTH DAILY AS  NEEDED 10/28/17  Yes Jerrol Banana., MD  metolazone (ZAROXOLYN) 5 MG tablet Take 1 tablet (5 mg total) by mouth daily. 05/16/18  Yes Jerrol Banana., MD  metoprolol succinate (TOPROL-XL) 25 MG 24 hr tablet Take 0.5 tablets (12.5 mg total) by mouth daily. 04/15/18  Yes Minna Merritts, MD  Multiple Vitamin (MULTIVITAMIN) tablet Take 1 tablet by mouth daily.     Yes [provider]  potassium chloride (K-DUR,KLOR-CON) 10 MEQ tablet Take 2 tablets (20 mEq total) by mouth daily. 05/24/18  Yes Jerrol Banana., MD  quinapril (ACCUPRIL) 40 MG tablet TAKE 1 TABLET (40 MG TOTAL) BY MOUTH 2 (TWO) TIMES DAILY. 02/12/18  Yes Gollan, Kathlene November, MD  simvastatin (ZOCOR) 10 MG tablet TAKE ONE TABLET BY MOUTH EVERY NIGHT AT BEDTIME 02/12/18  Yes Gollan, Kathlene November, MD  Tamsulosin HCl (FLOMAX) 0.4 MG CAPS Take 0.4 mg by mouth daily.     Yes [provider]  torsemide (DEMADEX) 20 MG tablet Take 40 mg by mouth daily. PRN    Yes [provider]  traZODone (DESYREL) 100 MG tablet TAKE ONE TABLET BY MOUTH EVERY NIGHT AT BEDTIME 04/13/18  Yes Rosanna Randy,  Retia Passe., MD  ONE TOUCH ULTRA TEST test strip USE TO TEST BLOOD SUGAR ONCE DAILY AS DIRECTED Patient not taking: Reported on 06/07/2018 02/14/17   Jerrol Banana., MD  traMADol (ULTRAM) 50 MG tablet Take 1 tablet (50 mg total) by mouth every 6 (six) hours as needed. Patient not taking: Reported on 06/07/2018 06/06/18   Jerrol Banana., MD  warfarin (COUMADIN) 5 MG tablet TAKE AS DIRECTED BY COUMADIN CLINIC 04/10/18   Minna Merritts, MD     Family History  Problem Relation Age of Onset  . Heart attack Father   . Arthritis Mother   . Cancer Sister        unknown  . Other Neg Hx        Gastrointestinal Malignancy    Social History   Socioeconomic History  . Marital status: Widowed    Spouse name: Not on file  . Number of children: 3  . Years of education: Not on file  . Highest education level: Not on  file  Occupational History  . Occupation: Charity fundraiser and Haematologist: RETIRED  Social Needs  . Financial resource strain: Not on file  . Food insecurity:    Worry: Not on file    Inability: Not on file  . Transportation needs:    Medical: Not on file    Non-medical: Not on file  Tobacco Use  . Smoking status: Former Smoker    Packs/day: 1.00    Years: 25.00    Pack years: 25.00    Last attempt to quit: 04/24/1975    Years since quitting: 43.1  . Smokeless tobacco: Never Used  Substance and Sexual Activity  . Alcohol use: Yes    Comment: 2-3 ounces of scotch day  . Drug use: No  . Sexual activity: Not on file  Lifestyle  . Physical activity:    Days per week: Not on file    Minutes per session: Not on file  . Stress: Not on file  Relationships  . Social connections:    Talks on phone: Not on file    Gets together: Not on file    Attends religious service: Not on file    Active member of club or organization: Not on file    Attends meetings of clubs or organizations: Not on file    Relationship status: Not on file  Other Topics Concern  . Not on file  Social History Narrative   He lives with his wife.            Review of Systems: A 12 point ROS discussed and pertinent positives are indicated in the HPI above.  All other systems are negative.  Review of Systems  Vital Signs: BP (!) 124/54   Pulse (!) 46   Temp 97.6 F (36.4 C) (Oral)   Resp (!) 21   Ht 5\' 5"  (1.651 m)   Wt 72.6 kg   SpO2 100%   BMI 26.63 kg/m   Physical Exam  Constitutional: He appears well-developed and well-nourished. No distress.  Eyes: Conjunctivae are normal. No scleral icterus.  Cardiovascular: Normal rate and regular rhythm.  Pulmonary/Chest: Effort normal and breath sounds normal.  Abdominal: Soft. Bowel sounds are normal.  Skin: He is not diaphoretic.    Imaging: Dg Chest 2 View  Result Date: 05/23/2018 CLINICAL DATA:  84 y/o  M; follow-up of congestive  heart failure. EXAM: CHEST - 2 VIEW COMPARISON:  05/15/2018 chest  radiograph. FINDINGS: Stable cardiomegaly given projection and technique. Aortic atherosclerosis with calcification. Post median sternotomy and CABG with wires in alignment. Decreased small right and small to moderate left pleural effusions as well as bibasilar opacities. Persistent pulmonary vascular congestion. No acute osseous abnormality is evident. IMPRESSION: Decreased small right and small to moderate left pleural effusions as well as associated basilar atelectasis. Persistent pulmonary vascular congestion. Electronically Signed   By: Kristine Garbe M.D.   On: 05/23/2018 04:51   Dg Chest 2 View  Result Date: 05/15/2018 CLINICAL DATA:  Dyspnea, lower extremity edema EXAM: CHEST - 2 VIEW COMPARISON:  12/21/2014 chest radiograph. FINDINGS: Intact sternotomy wires. CABG clips overlie the mediastinum. Stable cardiomediastinal silhouette with mild cardiomegaly. No pneumothorax. Small right and small to moderate left pleural effusions. Mild pulmonary edema. Bibasilar lung opacities, favor atelectasis. IMPRESSION: 1. Mild congestive heart failure. 2. Small right and small to moderate left pleural effusions with associated bibasilar lung opacities, favor atelectasis. Electronically Signed   By: Ilona Sorrel M.D.   On: 05/15/2018 20:57   US Abdomen Complete  Result Date: 05/20/2018 CLINICAL DATA:  Abdominal swelling, distention, pain EXAM: ABDOMEN ULTRASOUND COMPLETE COMPARISON:  04/21/2009 CT FINDINGS: Gallbladder: No gallstones or wall thickening visualized. No sonographic Murphy sign noted by sonographer. Common bile duct: Diameter: Normal caliber, 6 mm Liver: Markedly heterogeneous appearance throughout the liver. There appear to be multiple focal solid lesions throughout the liver, the largest measuring up to 4.1 cm. Appearance is concerning for diffuse hepatic metastases. Portal vein is occluded and could reflect bland or tumor  thrombus. IVC: No abnormality visualized. Pancreas: Small pancreatic body cyst measures up to 9 mm. No ductal dilatation. Spleen: Size and appearance within normal limits. Right Kidney: Length: Prior nephrectomy. Left Kidney: Length: 12.6 cm. 1 cm cyst in the upper pole. Echogenicity within normal limits. No mass or hydronephrosis visualized. Abdominal aorta: No aneurysm visualized. Other findings: Small amount of ascites adjacent to the liver. IMPRESSION: Markedly heterogeneous appearance of the liver with suggestion of numerous solid hepatic masses concerning for diffuse metastatic involvement. Bland versus tumor thrombus within the portal vein which is distended. Recommend further evaluation with CT with IV contrast. Small amount of ascites adjacent to the liver. 9 mm cyst noted in the region of the pancreatic body. Recommend attention on follow-up CT imaging. Prior right nephrectomy. These results will be called to the ordering clinician or representative by the Radiologist Assistant, and communication documented in the PACS or zVision Dashboard. Electronically Signed   By: Rolm Baptise M.D.   On: 05/20/2018 10:54   Nm Pet Image Initial (pi) Skull Base To Thigh  Result Date: 05/30/2018 CLINICAL DATA:  Initial treatment strategy for multiple liver masses. EXAM: NUCLEAR MEDICINE PET SKULL BASE TO THIGH TECHNIQUE: 8.6 mCi F-18 FDG was injected intravenously. Full-ring PET imaging was performed from the skull base to thigh after the radiotracer. CT data was obtained and used for attenuation correction and anatomic localization. Fasting blood glucose: 80 mg/dl COMPARISON:  Ultrasound 05/22/2018 FINDINGS: Mediastinal blood pool activity: SUV max 2.29 NECK: No hypermetabolic lymph nodes in the neck. Incidental CT findings: none CHEST: LEFT upper lobe pulmonary nodule measuring 14 mm (image 73/3) has associated metabolic activity SUV max equal 3.0 Small RIGHT lower lobe pulmonary nodule measures 10 mm (image 93/3)  with SUV max equal 1.5. There are  bilateral moderate layering pleural effusions No hypermetabolic mediastinal lymph nodes. Incidental CT findings: none ABDOMEN/PELVIS: Multiple low-density lesions in the RIGHT hepatic lobe reach near confluence  and are intensely hypermetabolic with SUV max equal 9.0. There is hypermetabolic thrombus within the LEFT portal vein (image 141 fused data set) with SUV max equal 8.9. Probable hypermetabolic lymph node adjacent to the RIGHT crus of the diaphragm No abnormal pancreas activity.  Hypermetabolic pelvic nodes. Incidental CT findings: Prostate enlarged. Extensive diverticulosis of the colon without acute inflammation. Atherosclerotic calcification of the aorta. Small volume ascites in the peritoneal space. SKELETON: No focal hypermetabolic activity to suggest skeletal metastasis. Incidental CT findings: none IMPRESSION: 1. Extensive hypermetabolic metastasis in the RIGHT hepatic lobe. 2. Tumor extension into the main and LEFT portal veins with hypermetabolic tumor thrombus. 3. Bilateral small hypermetabolic pulmonary nodules consistent with pulmonary metastasis. One nodule in each lung. 4. Bilateral moderate pleural effusions.  Small volume ascites. Electronically Signed   By: Suzy Bouchard M.D.   On: 05/30/2018 16:21   Dg Chest Port 1 View  Result Date: 06/04/2018 CLINICAL DATA:  Post left thoracentesis EXAM: PORTABLE CHEST 1 VIEW COMPARISON:  05/22/2018 FINDINGS: There is no pneumothorax after left thoracentesis. Left pleural effusion has improved. Heart remains enlarged. Small right pleural effusion is stable. Stable mild vascular congestion. IMPRESSION: No pneumothorax post left thoracentesis. Electronically Signed   By: Marybelle Killings M.D.   On: 06/04/2018 14:34   US Thoracentesis Asp Pleural Space W/img Guide  Result Date: 06/04/2018 INDICATION: Left pleural effusion EXAM: ULTRASOUND GUIDED LEFT THORACENTESIS MEDICATIONS: None. COMPLICATIONS: None immediate.  PROCEDURE: An ultrasound guided thoracentesis was thoroughly discussed with the patient and questions answered. The benefits, risks, alternatives and complications were also discussed. The patient understands and wishes to proceed with the procedure. Written consent was obtained. Ultrasound was performed to localize and mark an adequate pocket of fluid in the left chest. The area was then prepped and draped in the normal sterile fashion. 1% Lidocaine was used for local anesthesia. Under ultrasound guidance a 6 Fr Safe-T-Centesis catheter was introduced. Thoracentesis was performed. The catheter was removed and a dressing applied. FINDINGS: A total of approximately 900 cc of clear yellow fluid was removed. IMPRESSION: Successful ultrasound guided left thoracentesis yielding 900 cc of pleural fluid. Electronically Signed   By: Marybelle Killings M.D.   On: 06/04/2018 14:35    Labs:  CBC: Recent Labs    11/13/17 1224 04/30/18 0931 05/24/18 1140 06/07/18 0918  WBC 7.0 6.1 6.4 7.1  HGB 13.6 12.4* 12.5* 11.2*  HCT 40.5 37.5 38.1* 34.3*  PLT 170 144* 163 164    COAGS: Recent Labs    02/27/18 0953 03/27/18 0951 05/01/18 1002 06/07/18 0918  INR 1.7* 2.6 3.1* 1.46  APTT  --   --   --  41*    BMP: Recent Labs    04/30/18 0931 05/15/18 1215 05/22/18 1154 05/24/18 1140  NA 146* 142 140 139  K 3.8 3.8 3.3* 3.3*  CL 107* 101 96 99  CO2 23 24 25 29   GLUCOSE 97 103* 103* 122*  BUN 29* 27 40* 48*  CALCIUM 9.1 9.0 9.1 9.1  CREATININE 1.58* 1.58* 1.95* 1.88*  GFRNONAA 39* 39* 30* 31*  GFRAA 45* 45* 35* 35*    LIVER FUNCTION TESTS: Recent Labs    04/30/18 0931 05/15/18 1215 05/22/18 1154 05/24/18 1140  BILITOT 0.8  --  1.0 1.3*  AST 64*  --  76* 80*  ALT 15  --  19 21  ALKPHOS 122*  --  156* 145*  PROT 5.8*  --  6.1 6.3*  ALBUMIN 3.7 3.4* 3.7 3.6  TUMOR MARKERS: No results for input(s): AFPTM, CEA, CA199, CHROMGRNA in the last 8760 hours.  Assessment and Plan:  New liver  mets by imaging.  bx needed by Oncology to guide treatment  Risks and benefits discussed with the patient including, but not limited to bleeding, infection, damage to adjacent structures or low yield requiring additional tests.  All of the patient's questions were answered, patient is agreeable to proceed. Consent signed and in chart.    Thank you for this interesting consult.  I greatly enjoyed meeting [name] and look forward to participating in their care.  A copy of this report was sent to the requesting provider on this date.  Electronically Signed: Greggory Keen, MD 06/07/2018, 10:26 AM   I spent a total of  30 Minutes   in face to face in clinical consultation, greater than 50% of which was counseling/coordinating care for this patient with new liver mets

## 2018-06-07 NOTE — Procedures (Signed)
Liver mets  S/p US liver met bx  No comp Stable ebl 0 Full report in pacs Path pending

## 2018-06-10 ENCOUNTER — Telehealth: Payer: Self-pay

## 2018-06-10 NOTE — Telephone Encounter (Signed)
Called and spoke with [name]. We reviewed all upcoming appointments for this week. Dr. Tasia Catchings will see him Friday, 11/8 for liver biopsy results. Oncology Nurse Navigator Documentation  Navigator Location: [facility] (06/10/18 1300)   )Navigator Encounter Type: Telephone (06/10/18 1300) Telephone: Lahoma Crocker Call;Appt Confirmation/Clarification (06/10/18 1300)                                                  Time Spent with Patient: 15 (06/10/18 1300)

## 2018-06-12 ENCOUNTER — Ambulatory Visit: Payer: PPO

## 2018-06-12 DIAGNOSIS — Z7901 Long term (current) use of anticoagulants: Secondary | ICD-10-CM | POA: Diagnosis not present

## 2018-06-12 DIAGNOSIS — I4821 Permanent atrial fibrillation: Secondary | ICD-10-CM | POA: Diagnosis not present

## 2018-06-12 DIAGNOSIS — R809 Proteinuria, unspecified: Secondary | ICD-10-CM | POA: Diagnosis not present

## 2018-06-12 DIAGNOSIS — N183 Chronic kidney disease, stage 3 (moderate): Secondary | ICD-10-CM | POA: Diagnosis not present

## 2018-06-12 DIAGNOSIS — N2581 Secondary hyperparathyroidism of renal origin: Secondary | ICD-10-CM | POA: Diagnosis not present

## 2018-06-12 DIAGNOSIS — I129 Hypertensive chronic kidney disease with stage 1 through stage 4 chronic kidney disease, or unspecified chronic kidney disease: Secondary | ICD-10-CM | POA: Diagnosis not present

## 2018-06-12 LAB — POCT INR: INR: 2.2 (ref 2.0–3.0)

## 2018-06-12 LAB — SURGICAL PATHOLOGY

## 2018-06-12 NOTE — Patient Instructions (Addendum)
Please continue dosage of 1/2 tablet daily except 1 tablet on Mondays and Fridays.  Recheck in 6 weeks.   Please call if you are scheduled for any further procedures and/or need to hold your coumadin.

## 2018-06-13 ENCOUNTER — Ambulatory Visit: Payer: PPO | Admitting: Oncology

## 2018-06-14 ENCOUNTER — Other Ambulatory Visit: Payer: Self-pay

## 2018-06-14 ENCOUNTER — Inpatient Hospital Stay: Payer: PPO | Attending: Oncology | Admitting: Oncology

## 2018-06-14 ENCOUNTER — Encounter: Payer: Self-pay | Admitting: Oncology

## 2018-06-14 VITALS — BP 82/46 | HR 56 | Temp 96.7°F | Resp 18 | Wt 164.2 lb

## 2018-06-14 DIAGNOSIS — I2510 Atherosclerotic heart disease of native coronary artery without angina pectoris: Secondary | ICD-10-CM | POA: Insufficient documentation

## 2018-06-14 DIAGNOSIS — I272 Pulmonary hypertension, unspecified: Secondary | ICD-10-CM | POA: Insufficient documentation

## 2018-06-14 DIAGNOSIS — I130 Hypertensive heart and chronic kidney disease with heart failure and stage 1 through stage 4 chronic kidney disease, or unspecified chronic kidney disease: Secondary | ICD-10-CM

## 2018-06-14 DIAGNOSIS — C227 Other specified carcinomas of liver: Secondary | ICD-10-CM | POA: Diagnosis not present

## 2018-06-14 DIAGNOSIS — Z87891 Personal history of nicotine dependence: Secondary | ICD-10-CM | POA: Diagnosis not present

## 2018-06-14 DIAGNOSIS — C229 Malignant neoplasm of liver, not specified as primary or secondary: Secondary | ICD-10-CM

## 2018-06-14 DIAGNOSIS — I9589 Other hypotension: Secondary | ICD-10-CM

## 2018-06-14 DIAGNOSIS — Z79899 Other long term (current) drug therapy: Secondary | ICD-10-CM | POA: Diagnosis not present

## 2018-06-14 DIAGNOSIS — J90 Pleural effusion, not elsewhere classified: Secondary | ICD-10-CM

## 2018-06-14 DIAGNOSIS — Z7901 Long term (current) use of anticoagulants: Secondary | ICD-10-CM | POA: Insufficient documentation

## 2018-06-14 DIAGNOSIS — Z7189 Other specified counseling: Secondary | ICD-10-CM

## 2018-06-14 DIAGNOSIS — I5032 Chronic diastolic (congestive) heart failure: Secondary | ICD-10-CM | POA: Diagnosis not present

## 2018-06-14 DIAGNOSIS — C221 Intrahepatic bile duct carcinoma: Secondary | ICD-10-CM

## 2018-06-14 NOTE — Progress Notes (Signed)
Patient here for follow up. Patient's blood pressure is low. He denies and dizziness or lightheadedness. He monitors blood pressure at home and states it has been running low for the past 3 months, he has addressed issue with PCP.

## 2018-06-14 NOTE — Progress Notes (Signed)
Hematology/Oncology follow up note [facility] [phone] [phone]   Patient Care Team: Jerrol Banana., MD as PCP - General (Unknown Physician Specialty) Minna Merritts, MD as Consulting Physician (Cardiology) Thelma Comp, [place] as Consulting Physician (Optometry) Lavonia Dana, MD as Consulting Physician (Internal Medicine) Royston Cowper, MD as Consulting Physician (Urology) Clent Jacks, RN as Registered Nurse Benedetto Goad, RN as Case Manager Cathi Roan, [facility] (Pharmacist)  REFERRING PROVIDER: Jerrol Banana., MD CHIEF COMPLAINTS/REASON FOR VISIT:  Discussion of newly diagnosed adenocarcinoma of liver.  HISTORY OF PRESENTING ILLNESS:  [name] is a  84 y.o.  male with PMH listed below who was referred to me for evaluation of liver mass.  Patient was recently evaluated by primary care physician Dr. Rosanna Randy for abdominal swelling. Ultrasound abdomen was done which showed markedly heterogeneous appearance of the liver with numerous solid hepatic concerning for diffuse metastatic involvement.  There is also gland versus tumor thrombus within the portal vein which is distended.  Recommend further evaluation. Small amount of ascites adjacent to the liver.' 9 mm cyst noted in the region of pancreatic body.  Patient reports feeling fatigue for the past couple of months.  History of skin squamous cell carcinoma. History of extensive heart disease including RBBB, hypertension, CAD, CHF atrial fibrillation, he takes Coumadin for chronic and poor ambulation and torsemide.  Reports feeling difficult to reduce his bilateral lower extremity edema recently.  He also reports having severe constipation recently.  Not using any laxatives stool softeners.  Appetite is poor. Denies any nausea vomiting, abdominal pain. He lives by himself.  He has 2 daughters 1 son.  One daughter lives in [place], one daughter lives in  [place].  Son lives in [place]. He drinks 1-2 scotch every day.  Denies any history of hepatitis.   INTERVAL HISTORY [name] is a 84 y.o. male who has above history reviewed by me today presents for follow up visit for discussion of newly diagnosed cancer and management plan.  Patient's daughter Juliann Pulse accompanied patient to clinic today. Patient's blood pressure has been running low.  Per him has been running low for the past 3 months.  Denies any lightheadedness or dizziness. Neoplasm related pain, reports right upper quadrant pain, 4-5 out of 10, radiating to the back.  Patient tried tramadol 50 mg every 6 hours as needed last weekend and felt tramadol was too strong.  So he went back to take Tylenol 500 mg every 6 hours as needed and reports Tylenol has been taking care of his pain.  Status post left side thoracentesis and had 900 cc fluid removed.  Cytology negative for malignancy.  Very low cellularity specimen with lymphocytes, macrophages and rare neutrophils.  Reports shortness of breath is slightly better after the thoracentesis. Ultrasound-guided core biopsy of liver mass showed adenocarcinoma. Immunohistochemistry staining showed cancer cells are positive for CK7, coexpression of cytokeratin 20. CDX 2, TTF-1, Gata 3 are negative.  The IHC profile is nonspecific but would be compatible with pancreato biliary and upper GI tract origin.  Patient lives home by himself.  Review of Systems  Constitutional: Positive for malaise/fatigue and weight loss. Negative for chills and fever.  HENT: Negative for nosebleeds and sore throat.   Eyes: Negative for double vision, photophobia and redness.  Respiratory: Positive for shortness of breath. Negative for cough and wheezing.   Cardiovascular: Negative for chest pain, palpitations and orthopnea.  Gastrointestinal: Positive for abdominal  pain and constipation. Negative for blood in stool, nausea and vomiting.  Genitourinary: Negative  for dysuria.  Musculoskeletal: Negative for back pain, myalgias and neck pain.  Skin: Negative for itching and rash.  Neurological: Negative for dizziness, tingling and tremors.  Endo/Heme/Allergies: Negative for environmental allergies. Does not bruise/bleed easily.  Psychiatric/Behavioral: Negative for depression.    MEDICAL HISTORY:  Past Medical History:  Diagnosis Date  . Atrial fibrillation (HCC)    coumadin therapy  . CHF (congestive heart failure) ([place])   . Coronary artery disease    native vessel  . Edema   . Hypertension   . Pre-diabetes   . RBBB (right bundle branch block)   . Squamous cell carcinoma    left hand & right ear     SURGICAL HISTORY: Past Surgical History:  Procedure Laterality Date  . APPENDECTOMY    . CARDIOVERSION  09/21/08  . CORONARY ARTERY BYPASS GRAFT    . HERNIA REPAIR    . MELANOMA EXCISION     back  . NEPHRECTOMY    . PROSTATE ABLATION     transurethral needle ablation  . SQUAMOUS CELL CARCINOMA EXCISION     left hand & right ear.     SOCIAL HISTORY: Social History   Socioeconomic History  . Marital status: Widowed    Spouse name: Not on file  . Number of children: 3  . Years of education: Not on file  . Highest education level: Not on file  Occupational History  . Occupation: Charity fundraiser and Haematologist: RETIRED  Social Needs  . Financial resource strain: Not on file  . Food insecurity:    Worry: Not on file    Inability: Not on file  . Transportation needs:    Medical: Not on file    Non-medical: Not on file  Tobacco Use  . Smoking status: Former Smoker    Packs/day: 1.00    Years: 25.00    Pack years: 25.00    Last attempt to quit: 04/24/1975    Years since quitting: 43.1  . Smokeless tobacco: Never Used  Substance and Sexual Activity  . Alcohol use: Yes    Comment: 2-3 ounces of scotch day  . Drug use: No  . Sexual activity: Not on file  Lifestyle  . Physical activity:    Days per week: Not on  file    Minutes per session: Not on file  . Stress: Not on file  Relationships  . Social connections:    Talks on phone: Not on file    Gets together: Not on file    Attends religious service: Not on file    Active member of club or organization: Not on file    Attends meetings of clubs or organizations: Not on file    Relationship status: Not on file  . Intimate partner violence:    Fear of current or ex partner: Not on file    Emotionally abused: Not on file    Physically abused: Not on file    Forced sexual activity: Not on file  Other Topics Concern  . Not on file  Social History Narrative   He lives with his wife.          FAMILY HISTORY: Family History  Problem Relation Age of Onset  . Heart attack Father   . Arthritis Mother   . Cancer Sister        unknown  . Other Neg Hx  Gastrointestinal Malignancy    ALLERGIES:  is allergic to pollen extract.  MEDICATIONS:  Current Outpatient Medications  Medication Sig Dispense Refill  . acetaminophen (TYLENOL) 650 MG CR tablet Take 650 mg by mouth every 6 (six) hours as needed for pain.    . Ascorbic Acid (VITAMIN C) 500 MG tablet Take 500 mg by mouth daily.      . cloNIDine (CATAPRES) 0.2 MG tablet TAKE 1 TABLET (0.2 MG TOTAL) BY MOUTH 2 (TWO) TIMES DAILY. 180 tablet 3  . glucosamine-chondroitin 500-400 MG tablet Take 1 tablet by mouth daily.      . hydrALAZINE (APRESOLINE) 50 MG tablet TAKE ONE TABLET BY MOUTH THREE TIMES A DAY AS NEEDED 90 tablet 2  . isosorbide mononitrate (IMDUR) 60 MG 24 hr tablet TAKE ONE TABLET BY MOUTH DAILY. THIS IS A CHANGE IN DOSE. PLEASE DISCONTINUE THE 30MG  TABLETS 30 tablet 10  . loratadine (CLARITIN) 10 MG tablet TAKE 1 TABLET BY MOUTH DAILY AS NEEDED 30 tablet 11  . metolazone (ZAROXOLYN) 5 MG tablet Take 1 tablet (5 mg total) by mouth daily. 30 tablet 3  . metoprolol succinate (TOPROL-XL) 25 MG 24 hr tablet Take 0.5 tablets (12.5 mg total) by mouth daily. 45 tablet 7  . Multiple  Vitamin (MULTIVITAMIN) tablet Take 1 tablet by mouth daily.      . ONE TOUCH ULTRA TEST test strip USE TO TEST BLOOD SUGAR ONCE DAILY AS DIRECTED 100 each 11  . potassium chloride (K-DUR,KLOR-CON) 10 MEQ tablet Take 2 tablets (20 mEq total) by mouth daily. 60 tablet 5  . quinapril (ACCUPRIL) 40 MG tablet TAKE 1 TABLET (40 MG TOTAL) BY MOUTH 2 (TWO) TIMES DAILY. 180 tablet 3  . simvastatin (ZOCOR) 10 MG tablet TAKE ONE TABLET BY MOUTH EVERY NIGHT AT BEDTIME 90 tablet 3  . Tamsulosin HCl (FLOMAX) 0.4 MG CAPS Take 0.4 mg by mouth daily.      Marland Kitchen torsemide (DEMADEX) 20 MG tablet Take 40 mg by mouth daily. PRN     . traZODone (DESYREL) 100 MG tablet TAKE ONE TABLET BY MOUTH EVERY NIGHT AT BEDTIME 72 tablet 2  . warfarin (COUMADIN) 5 MG tablet TAKE AS DIRECTED BY COUMADIN CLINIC 60 tablet 0   No current facility-administered medications for this visit.      PHYSICAL EXAMINATION: ECOG PERFORMANCE STATUS: 2 - Symptomatic, <50% confined to bed Vitals:   06/14/18 0959  BP: (!) 82/46  Pulse: (!) 56  Resp: 18  Temp: (!) 96.7 F (35.9 C)   Filed Weights   06/14/18 0959  Weight: 164 lb 3.2 oz (74.5 kg)    Physical Exam  Constitutional: He is oriented to person, place, and time. No distress.  Sitting in a wheelchair  HENT:  Head: Normocephalic and atraumatic.  Mouth/Throat: Oropharynx is clear and moist.  Eyes: Pupils are equal, round, and reactive to light. EOM are normal. No scleral icterus.  Neck: Normal range of motion. Neck supple.  Cardiovascular: Normal rate.  Pulmonary/Chest: Effort normal. No respiratory distress. He has no wheezes.  Decreased breath sound bilateral.  Abdominal: Soft. Bowel sounds are normal. He exhibits distension.  Musculoskeletal: Normal range of motion. He exhibits no edema or deformity.  Neurological: He is alert and oriented to person, place, and time. No cranial nerve deficit. Coordination normal.  Skin: Skin is warm and dry. No rash noted. No erythema.    Psychiatric: He has a normal mood and affect. His behavior is normal. Thought content normal.  LABORATORY DATA:  I have reviewed the data as listed Lab Results  Component Value Date   WBC 7.1 06/07/2018   HGB 11.2 (L) 06/07/2018   HCT 34.3 (L) 06/07/2018   MCV 91.5 06/07/2018   PLT 164 06/07/2018   Recent Labs    04/30/18 0931 05/15/18 1215 05/22/18 1154 05/24/18 1140  NA 146* 142 140 139  K 3.8 3.8 3.3* 3.3*  CL 107* 101 96 99  CO2 23 24 25 29   GLUCOSE 97 103* 103* 122*  BUN 29* 27 40* 48*  CREATININE 1.58* 1.58* 1.95* 1.88*  CALCIUM 9.1 9.0 9.1 9.1  GFRNONAA 39* 39* 30* 31*  GFRAA 45* 45* 35* 35*  PROT 5.8*  --  6.1 6.3*  ALBUMIN 3.7 3.4* 3.7 3.6  AST 64*  --  76* 80*  ALT 15  --  19 21  ALKPHOS 122*  --  156* 145*  BILITOT 0.8  --  1.0 1.3*   Iron/TIBC/Ferritin/ %Sat No results found for: IRON, TIBC, FERRITIN, IRONPCTSAT   RADIOGRAPHIC STUDIES: I have personally reviewed the radiological images as listed and agreed with the findings in the report. 05/30/2018 PET 1. Extensive hypermetabolic metastasis in the RIGHT hepatic lobe. 2. Tumor extension into the main and LEFT portal veins with hypermetabolic tumor thrombus. 3. Bilateral small hypermetabolic pulmonary nodules consistent with pulmonary metastasis. One nodule in each lung. 4. Bilateral moderate pleural effusions.  Small volume ascites   ASSESSMENT & PLAN:  1. Goals of care, counseling/discussion   2. Adenocarcinoma of liver ([place])   3. Pleural effusion   4. Other specified hypotension   5. Cholangiocarcinoma [facility])    #PET scan was independently reviewed by me and discussed with patient and his daughter. I also reviewed the pathology results. PET scan showed extensive hypermetabolic metastasis in the right hepatic lobe.  Tumor extend into mainly in the left portal vein with hypermetabolic tumor thrombus.  Bilateral small hypermetabolic pulmonary nodules consistent with pulmonary metastasis.   Bilateral moderate pleural effusion. Pathology of liver biopsy showed adenocarcinoma, immunohistochemistry staining compatible with pancreatic biliary origin or upper gynecology. Given that on PET scan he does not have any pancreatic or upper GI hypermetabolic activities ,CA 31.5 elevated,  clinically compatible with cholangiocarcinoma.  I had a lengthy discussion with patient and his daughter.  Prognosis is extremely poor,  given patient's advanced age, multiple comorbidities including diastolic CHF, chronic LE edema, pulmonary HTN, right bundle branch block, atrial fibrillation, CAD, CKD, hypotension, he is not a good candidate for chemotherapy. Patient also tells me that he is not interested in any chemotherapy and is interested in comfort care/ hospice. His estimated life expectancy is about 3- 6 months. Will refer to hospice.   For his pain, I advise that he can continue to use Tylenol 500mg  BID as needed. If pain is not controlled, he may try Tramadol Q6 h PRN.   Currently he is breathing comfortably on room oxygen. His respiratory status may deteriorate in the near future. He may need home oxygen. Repeat thoracentesis can also be considered.   We spent sufficient time to discuss many aspect of comfort care, questions were answered to patient's satisfaction. The patient knows to call the clinic with any problems questions or concerns.  Refer to hospice.  Total face to face encounter time for this patient visit was 70 min. >50% of the time was  spent in counseling and coordination of care.   Earlie Server, MD, PhD Hematology Oncology [facility] at  [facility] Pager- [phone] 06/14/2018

## 2018-06-17 NOTE — Progress Notes (Signed)
Blood pressure  running low on visit with oncology and with Dr. Rosanna Randy Possibly from weight loss Triage: Would recommend he decrease the clonidine down to 0.1 mg twice daily, new prescription may be needed Cut the benazepril in half down to 20 daily Monitor blood pressure closely If blood pressure continues to run low after 1 week We will cut the isosorbide down to 30 mg daily from 60 We will CC Dr. Rosanna Randy

## 2018-06-19 ENCOUNTER — Ambulatory Visit: Payer: Self-pay

## 2018-06-19 DIAGNOSIS — R160 Hepatomegaly, not elsewhere classified: Secondary | ICD-10-CM

## 2018-06-19 DIAGNOSIS — C229 Malignant neoplasm of liver, not specified as primary or secondary: Secondary | ICD-10-CM

## 2018-06-19 NOTE — Chronic Care Management (AMB) (Signed)
   Chronic Care Management   Follow Up Note   06/19/2018 Name: TRESTON COKER MRN: [id number] DOB: [birth year]  Referred by: Jerrol Banana., MD Reason for referral : Chronic Care Management (Outreach)  Subjective: "I'm going into home Hospice care"  Assessment: Pleasant 84 year old man referred to CCM team by Dr. Rosanna Randy for support surrounding diagnosis of liver cancer. Referral place 06/05/18, patient consented to services with Trish Fountain, RN care manager on 06/05/18. Mr. Ignasiak saw his cardiologist on 06/14/18 and was diagnosed with metastatic liver and gall bladder cancer, with a prognosis of 3-6 months. He was referred to Hospice by his oncologist. Mr. Mcfayden reports that he has signed paperwork for at home hospice care and his initial home intake appointment has been completed.    Plan: Hospice will managing Mr. Meiklejohn care, including chronic care management needs. Mr. Kedzierski thanks the CCM team and will be in contact if he has any questions or concerns that cannot be addressed through hospice. Contact information for CCM team provided.   After verbal consultation with Jiles Garter (nurse) and Dr. Rosanna Randy, I have cancelled patient's follow up appointment for 06/26/18 for blood pressure management.   Time spent: 6 minutes   Ruben Reason, PharmD Clinical Pharmacist [place] [phone]

## 2018-06-20 ENCOUNTER — Telehealth: Payer: Self-pay | Admitting: *Deleted

## 2018-06-20 MED ORDER — QUINAPRIL HCL 40 MG PO TABS: 20.0000 mg | ORAL_TABLET | Freq: Two times a day (BID) | ORAL | 3 refills | Status: AC

## 2018-06-20 MED ORDER — CLONIDINE HCL 0.1 MG PO TABS: 0.1000 mg | ORAL_TABLET | Freq: Two times a day (BID) | ORAL | 11 refills | Status: AC

## 2018-06-20 NOTE — Telephone Encounter (Signed)
I spoke with the patient. He is aware of Dr. Donivan Scull recommendations to:  1) Decrease clonidine to 0.1 mg- take 1 tablet BID (new RX sent to the pharmacy for this)  2) Per MD note below, decrease benazepril to 20 mg once daily, however the patient is currently on quinipril 40 mg BID. I have asked that he cut the quinipril down to 20 mg BID- the patient states he has lots of the 40 mg tablets, she he will cut these in 1/2.  He is aware to monitor his BP for the next week and call us back with the readings. At that time, Dr. [name] can decide if the imdur needs to be decreased.   The patient verbalizes understanding of the above and is agreeable.

## 2018-06-20 NOTE — Telephone Encounter (Signed)
Author: Minna Merritts, MD Service: Cardiology Author Type: Physician  Filed: 06/17/2018 5:18 PM Encounter Date: 06/14/2018 Status: Signed  Editor: Minna Merritts, MD (Physician)     Show:Clear all [x] Manual[] Template[x] Copied  Added by: [x] Minna Merritts, MD  [] Hover for details Blood pressure  running low on visit with oncology and with Dr. Rosanna Randy Possibly from weight loss Triage: Would recommend he decrease the clonidine down to 0.1 mg twice daily, new prescription may be needed Cut the benazepril in half down to 20 daily Monitor blood pressure closely If blood pressure continues to run low after 1 week We will cut the isosorbide down to 30 mg daily from 60 We will CC Dr. Rosanna Randy

## 2018-06-20 NOTE — Telephone Encounter (Signed)
I spoke with the patient. I advised him that we had some orders from Dr. [name] to cut back on some of his medications due to his low BP. The patient was not where he could take down these instructions at this time. He states he will call us back later this afternoon.

## 2018-06-26 ENCOUNTER — Ambulatory Visit: Payer: PPO | Admitting: Family Medicine

## 2018-06-29 ENCOUNTER — Ambulatory Visit: Payer: PPO | Admitting: Family Medicine

## 2018-07-01 ENCOUNTER — Other Ambulatory Visit: Payer: Self-pay | Admitting: Family Medicine

## 2018-07-08 ENCOUNTER — Telehealth: Payer: Self-pay | Admitting: Cardiovascular Disease

## 2018-07-08 NOTE — Telephone Encounter (Signed)
Spoke w/ Judson Roch.  She will be checking pt's INR and calling w/ results so I can dose him over the phone.  Provided her w/ my direct #.  She is appreciative.

## 2018-07-08 NOTE — Telephone Encounter (Signed)
Sarah With [facility] calling stating patient is asking if from now of he could have the home health nurse do his Coumadin   Would like a call back on this Please advise

## 2018-07-24 ENCOUNTER — Ambulatory Visit (INDEPENDENT_AMBULATORY_CARE_PROVIDER_SITE_OTHER): Payer: PPO

## 2018-07-24 DIAGNOSIS — Z7901 Long term (current) use of anticoagulants: Secondary | ICD-10-CM

## 2018-07-24 DIAGNOSIS — Z5181 Encounter for therapeutic drug level monitoring: Secondary | ICD-10-CM

## 2018-07-24 LAB — POCT INR: INR: 3.5 — AB (ref 2.0–3.0)

## 2018-07-24 NOTE — Patient Instructions (Signed)
Spoke w/ Sarah w/ Hospice.  Pt recently diagnosed w/ metastatic liver & gallbladder cancer.  she visits pt every Monday. Pt's health is declining and he has poor appetite. Advised her to please have pt START NEW DOSAGE of dosage of 1/2 tablet daily. Recheck in 1 week.

## 2018-07-29 ENCOUNTER — Ambulatory Visit (INDEPENDENT_AMBULATORY_CARE_PROVIDER_SITE_OTHER): Payer: PPO

## 2018-07-29 DIAGNOSIS — Z7901 Long term (current) use of anticoagulants: Secondary | ICD-10-CM | POA: Diagnosis not present

## 2018-07-29 DIAGNOSIS — I4821 Permanent atrial fibrillation: Secondary | ICD-10-CM | POA: Diagnosis not present

## 2018-07-29 LAB — POCT INR: INR: 2.6 (ref 2.0–3.0)

## 2018-07-29 NOTE — Patient Instructions (Addendum)
Spoke w/ Sarah w/ Hospice.  Pt recently diagnosed w/ metastatic liver & gallbladder cancer.  she visits pt every Monday. Pt's health is declining and he has poor appetite. Advised her to please have pt continue dosage of 1/2 tablet daily. Recheck in 1 week.

## 2018-08-05 ENCOUNTER — Ambulatory Visit (INDEPENDENT_AMBULATORY_CARE_PROVIDER_SITE_OTHER)

## 2018-08-05 DIAGNOSIS — K862 Cyst of pancreas: Secondary | ICD-10-CM

## 2018-08-05 DIAGNOSIS — Z7901 Long term (current) use of anticoagulants: Secondary | ICD-10-CM

## 2018-08-05 DIAGNOSIS — I4821 Permanent atrial fibrillation: Secondary | ICD-10-CM | POA: Diagnosis not present

## 2018-08-05 DIAGNOSIS — R160 Hepatomegaly, not elsewhere classified: Secondary | ICD-10-CM

## 2018-08-05 DIAGNOSIS — R1907 Generalized intra-abdominal and pelvic swelling, mass and lump: Secondary | ICD-10-CM

## 2018-08-05 LAB — POCT INR: INR: 2.5 (ref 2.0–3.0)

## 2018-08-05 NOTE — Patient Instructions (Signed)
Spoke w/ [name] w/ Hospice.  Pt recently diagnosed w/ metastatic liver & gallbladder cancer.  she visits pt every Monday. Pt's health is declining and he has poor appetite. Advised her to please have pt continue dosage of 1/2 tablet daily. Recheck in 2 weeks.

## 2018-08-06 ENCOUNTER — Telehealth: Payer: Self-pay | Admitting: *Deleted

## 2018-08-06 NOTE — Telephone Encounter (Signed)
Hospice nurse Sarah called requesting a change in pain medicine dosing for Tramadol to be every 4 hours as needed instead of every 6 hours as needed. Please advise

## 2018-08-06 NOTE — Telephone Encounter (Signed)
Per VO Dr Tasia Catchings ok to change dosing to every 4 hours as needed, Judson Roch informed

## 2018-08-19 ENCOUNTER — Ambulatory Visit (INDEPENDENT_AMBULATORY_CARE_PROVIDER_SITE_OTHER)

## 2018-08-19 DIAGNOSIS — Z5181 Encounter for therapeutic drug level monitoring: Secondary | ICD-10-CM | POA: Diagnosis not present

## 2018-08-19 DIAGNOSIS — Z7901 Long term (current) use of anticoagulants: Secondary | ICD-10-CM | POA: Diagnosis not present

## 2018-08-19 LAB — POCT INR: INR: 3.1 — AB (ref 2.0–3.0)

## 2018-08-19 NOTE — Patient Instructions (Signed)
Spoke w/ [name] w/ Hospice.  Pt recently diagnosed w/ metastatic liver & gallbladder cancer.  she visits pt every Monday. Pt's health is declining and he has poor appetite. Advised her to please have pt skip coumadin tonight, then continue dosage of 1/2 tablet daily. Recheck in 1 week.

## 2018-08-26 LAB — POCT INR: INR: 2.5 (ref 2.0–3.0)

## 2018-09-02 ENCOUNTER — Ambulatory Visit (INDEPENDENT_AMBULATORY_CARE_PROVIDER_SITE_OTHER)

## 2018-09-02 ENCOUNTER — Telehealth: Payer: Self-pay | Admitting: *Deleted

## 2018-09-02 ENCOUNTER — Other Ambulatory Visit: Payer: Self-pay | Admitting: Oncology

## 2018-09-02 DIAGNOSIS — Z5181 Encounter for therapeutic drug level monitoring: Secondary | ICD-10-CM

## 2018-09-02 DIAGNOSIS — Z7901 Long term (current) use of anticoagulants: Secondary | ICD-10-CM

## 2018-09-02 MED ORDER — OXYCODONE HCL 5 MG PO TABS: 5.0000 mg | ORAL_TABLET | Freq: Four times a day (QID) | ORAL | 0 refills | Status: DC | PRN

## 2018-09-02 NOTE — Telephone Encounter (Signed)
Ok. Discontinue Tramadol. I will send Rx for oxycodone. Thanks.

## 2018-09-02 NOTE — Telephone Encounter (Signed)
Hospice nurse called requesting stronger medicine than Tramadol which is not controlling his pain. Please advise

## 2018-09-02 NOTE — Patient Instructions (Signed)
Spoke w/ Sarah w/ Hospice.  Pt recently diagnosed w/ metastatic liver & gallbladder cancer.  she visits pt every Monday. Pt's health is declining and he has poor appetite. Advised her to please have pt continue dosage of 1/2 tablet daily. She reports that she "called last week, but the call wouldn't go through".  Asked her to recheck INR next Monday.

## 2018-09-02 NOTE — Telephone Encounter (Signed)
Sar infomred of Oxycodone order

## 2018-09-04 ENCOUNTER — Other Ambulatory Visit: Payer: Self-pay | Admitting: Oncology

## 2018-09-04 MED ORDER — OXYCODONE HCL 5 MG PO TABS: 10.0000 mg | ORAL_TABLET | Freq: Four times a day (QID) | ORAL | 0 refills | Status: DC | PRN

## 2018-09-09 ENCOUNTER — Other Ambulatory Visit: Payer: Self-pay | Admitting: Oncology

## 2018-09-09 ENCOUNTER — Other Ambulatory Visit: Payer: Self-pay | Admitting: Family Medicine

## 2018-09-09 DIAGNOSIS — I509 Heart failure, unspecified: Secondary | ICD-10-CM

## 2018-09-09 DIAGNOSIS — R609 Edema, unspecified: Secondary | ICD-10-CM

## 2018-09-09 MED ORDER — OXYCODONE HCL 5 MG PO TABS: 10.0000 mg | ORAL_TABLET | ORAL | 0 refills | Status: AC | PRN

## 2018-09-10 LAB — POCT INR: INR: 3.1 — AB (ref 2.0–3.0)

## 2018-09-11 ENCOUNTER — Telehealth: Payer: Self-pay | Admitting: *Deleted

## 2018-09-11 ENCOUNTER — Ambulatory Visit (INDEPENDENT_AMBULATORY_CARE_PROVIDER_SITE_OTHER)

## 2018-09-11 ENCOUNTER — Telehealth: Payer: Self-pay

## 2018-09-11 DIAGNOSIS — Z7901 Long term (current) use of anticoagulants: Secondary | ICD-10-CM | POA: Diagnosis not present

## 2018-09-11 NOTE — Telephone Encounter (Signed)
I would defer the decision whether to stop  Coumadin to the hospice team and family He has permanent atrial fibrillation and there would be a risk of stroke if warfarin is held Depending on his underlying cancer and liver disease, there may be a  risk of bleeding on the warfarin.  I am uncertain of his risk.  Family could discuss with oncology

## 2018-09-11 NOTE — Telephone Encounter (Signed)
Patient has determined to stop all medications except diuretics and pain medications. FYI

## 2018-09-11 NOTE — Telephone Encounter (Addendum)
Judson Roch, RN w/ Hospice called w/ pt's INR reading.  His INR today is 3.1, adjusted his dose accordingly (please see anti-coag note for today). Pt was diagnosed w/ metastatic liver & gallbladder cancer in Nov and his prognosis at that time was 3-6 mos.  Judson Roch states that pt's health is rapidly declining, he has poor appetite and is bruising easily. She reports that pt's daughter would like to know if pt can d/c coumadin and just remain on aspirin.  Lucile Crater that I will make Dr. [name] aware of pt's decline and call her back w/ his recommendation.

## 2018-09-11 NOTE — Patient Instructions (Signed)
Spoke w/ [name] w/ Hospice.  Pt recently diagnosed w/ metastatic liver & gallbladder cancer.  she visits pt every Monday. Pt's health is declining and he has poor appetite. Advised her to please have pt skip coumadin tonight, then continue dosage of 1/2 tablet daily. Will send note to Dr. [name] asking if he wants pt to continue coumadin since his condition is declining.

## 2018-09-11 NOTE — Telephone Encounter (Signed)
Thanks for the update

## 2018-09-12 NOTE — Telephone Encounter (Signed)
Information given to Sarah, [facility] RN, and she verbalized understanding of Dr Donivan Scull recommendations. She will talk with family and oncology.

## 2018-09-19 ENCOUNTER — Telehealth: Payer: Self-pay | Admitting: *Deleted

## 2018-10-06 NOTE — Telephone Encounter (Signed)
Sarah from Hospice called to report that patient expired this morning at 7:45AM

## 2018-10-06 DEATH — deceased

## 2019-01-22 ENCOUNTER — Encounter: Payer: Self-pay | Admitting: Oncology

## 2019-04-09 IMAGING — CR DG CHEST 2V
1 series · 2 of 2 positions shown · non-contrast
Comparison: 12/21/2014 chest radiograph.

CLINICAL DATA: Dyspnea, lower extremity edema

EXAM:
CHEST - 2 VIEW

[Series 1: dg chest 2 view · 0.14mm/px · 2 of 2 slices shown]
[im 1/2]
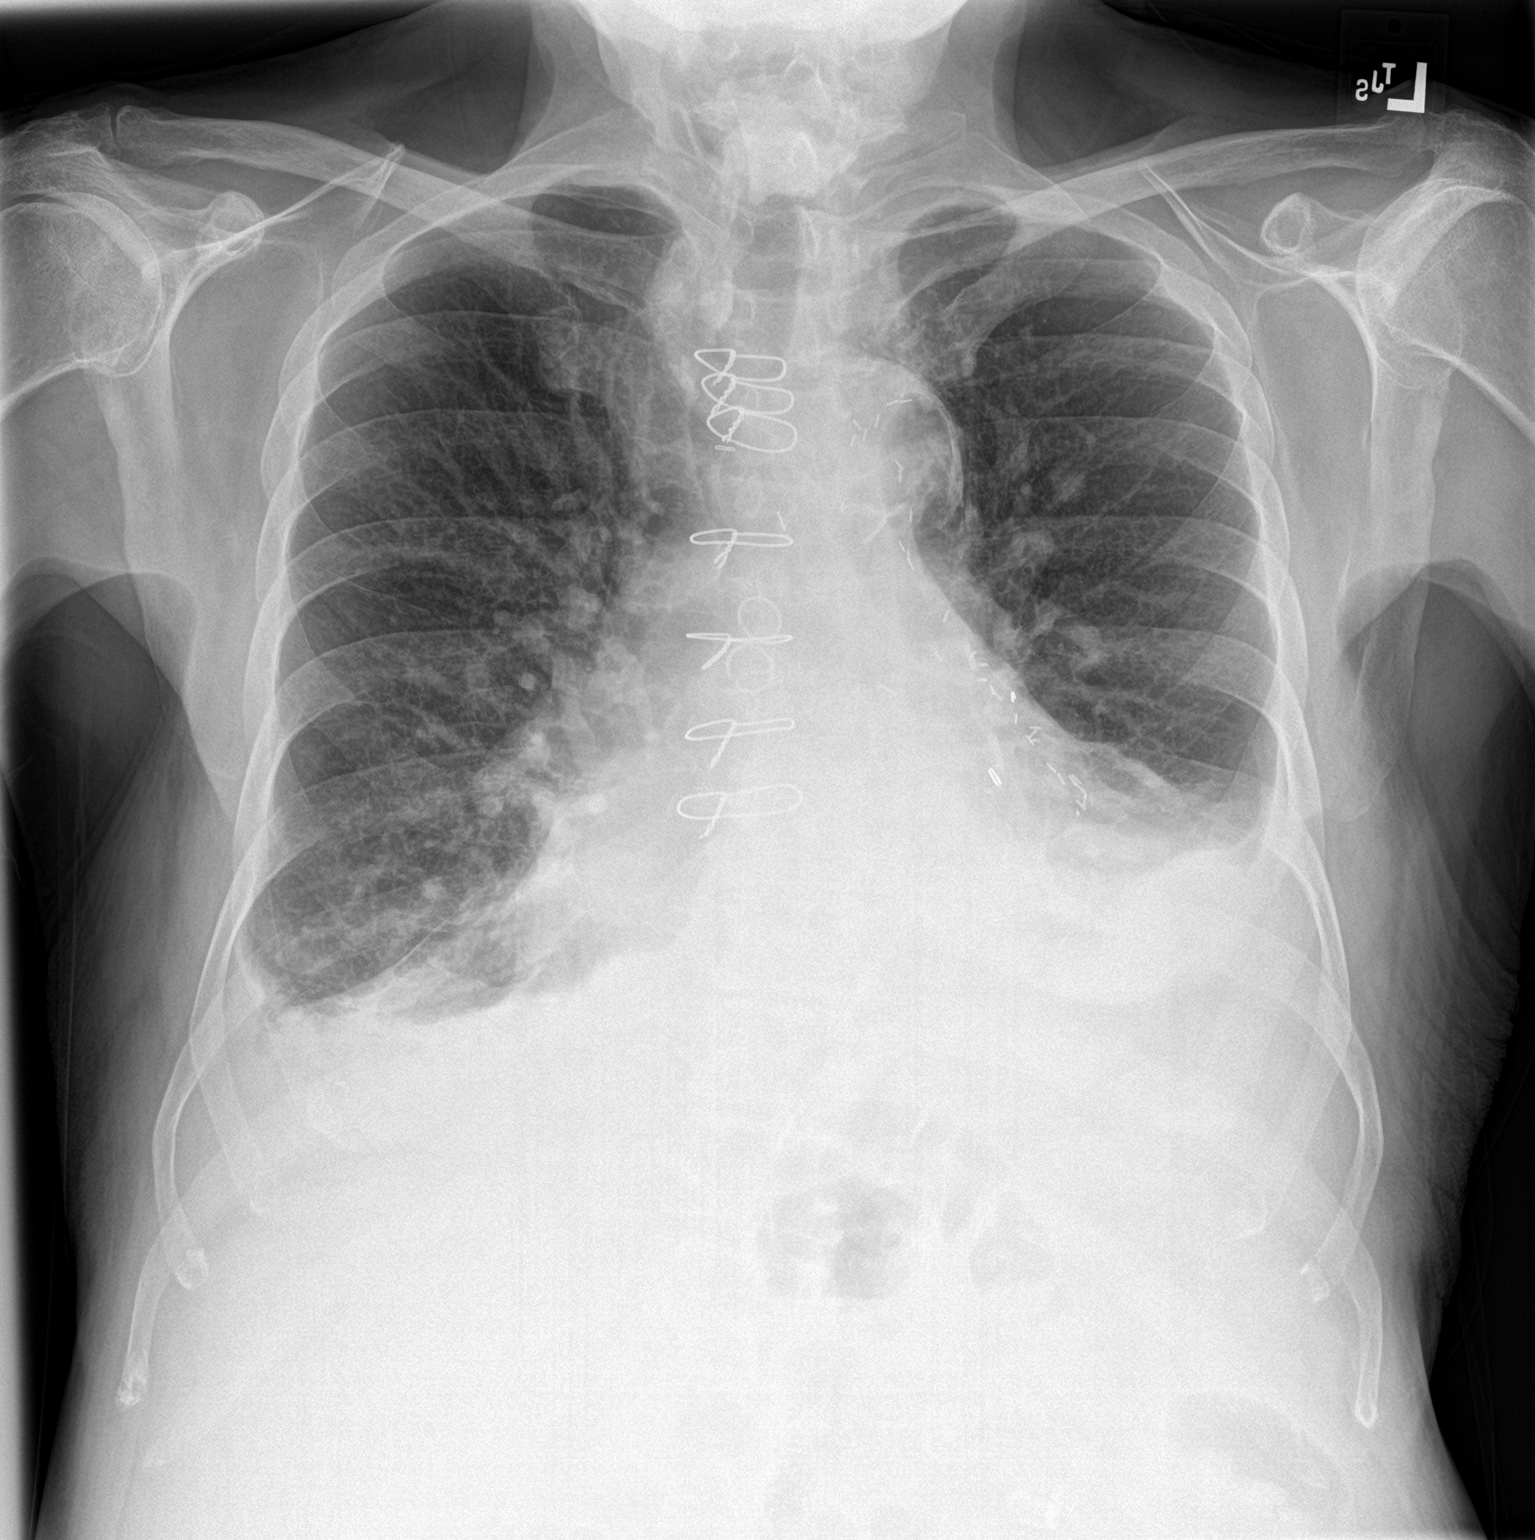
[im 2/2]
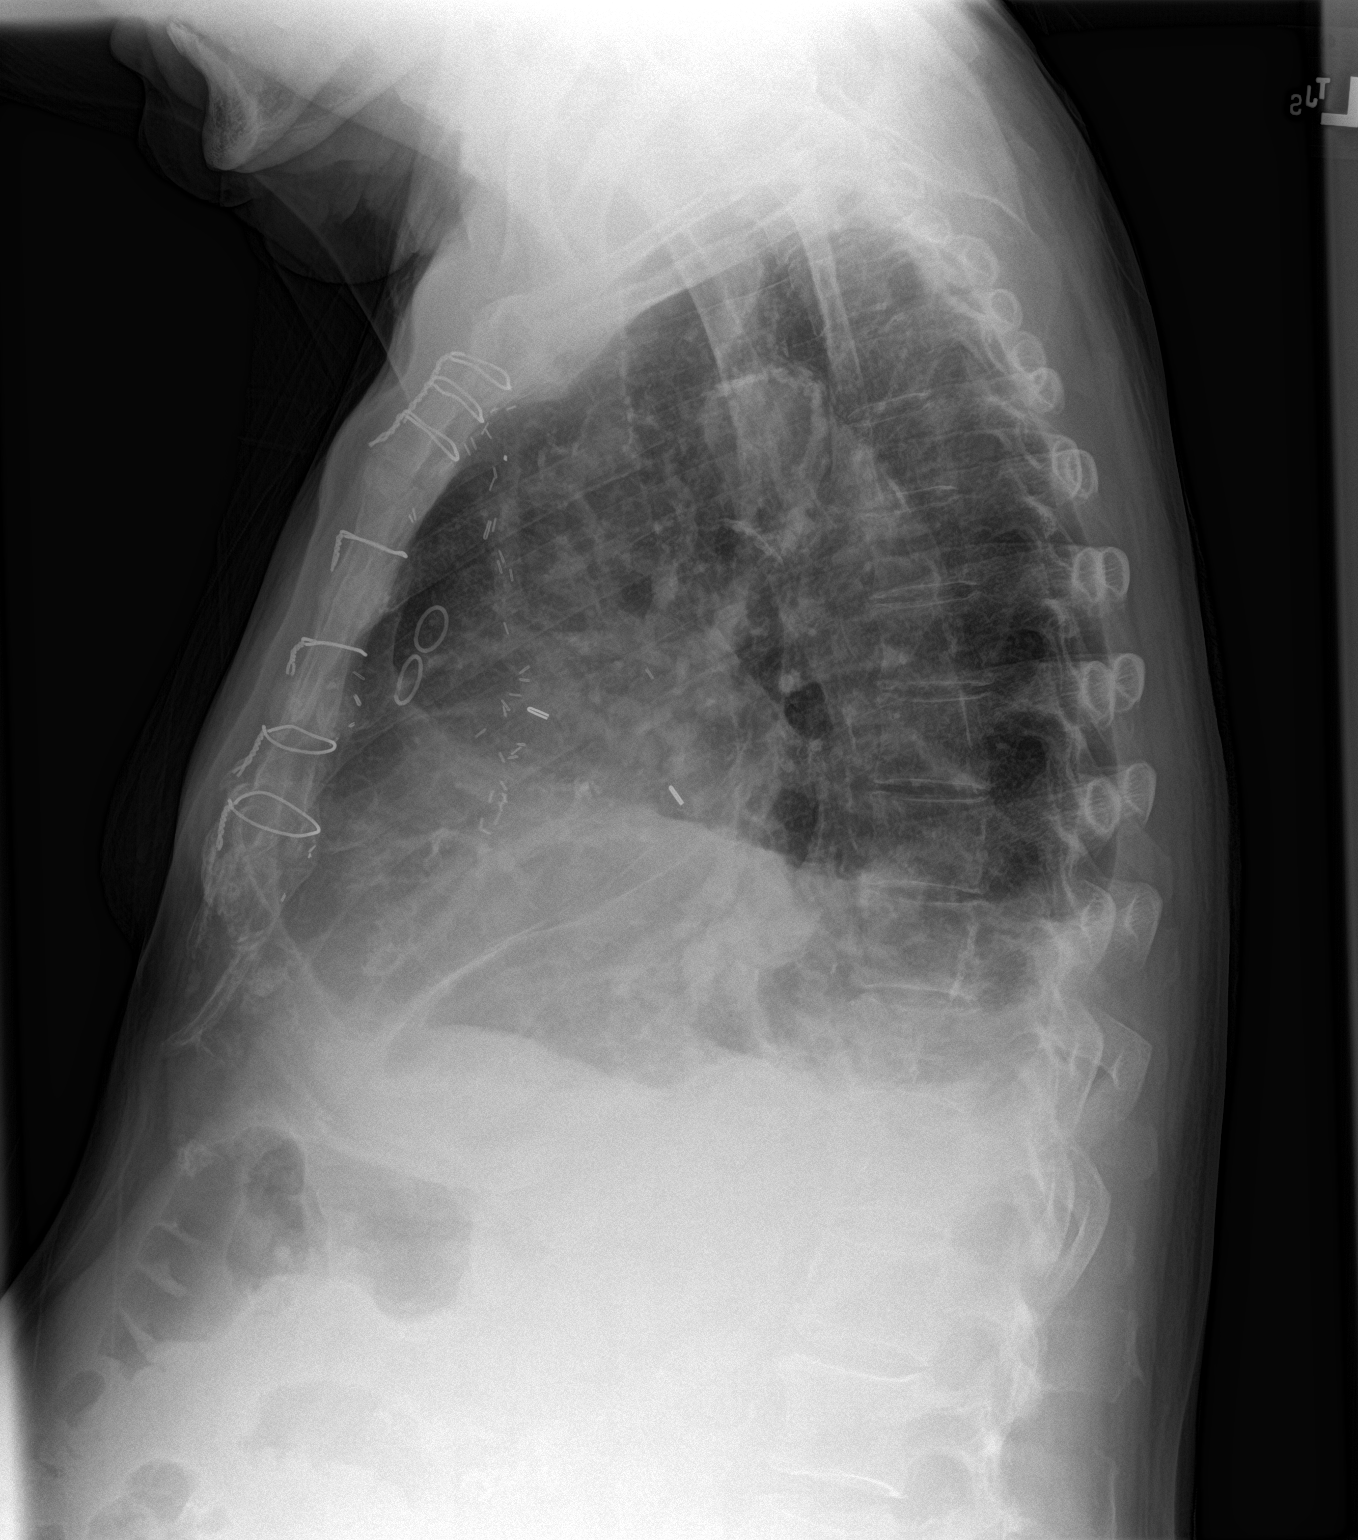

[2 of 2 positions shown; findings below may reference images not displayed]

FINDINGS: Intact sternotomy wires. CABG clips overlie the mediastinum. Stable
cardiomediastinal silhouette with mild cardiomegaly. No
pneumothorax. Small right and small to moderate left pleural
effusions. Mild pulmonary edema. Bibasilar lung opacities, favor
atelectasis.
IMPRESSION: 1. Mild congestive heart failure.
2. Small right and small to moderate left pleural effusions with
associated bibasilar lung opacities, favor atelectasis.

## 2019-04-16 IMAGING — CR DG CHEST 2V
1 series · 2 of 2 positions shown · non-contrast
Comparison: 05/15/2018 chest radiograph.

CLINICAL DATA: 89 y/o  M; follow-up of congestive heart failure.

EXAM:
CHEST - 2 VIEW

[Series 1: dg chest 2 view · 0.14mm/px · 2 of 2 slices shown]
[im 1/2]
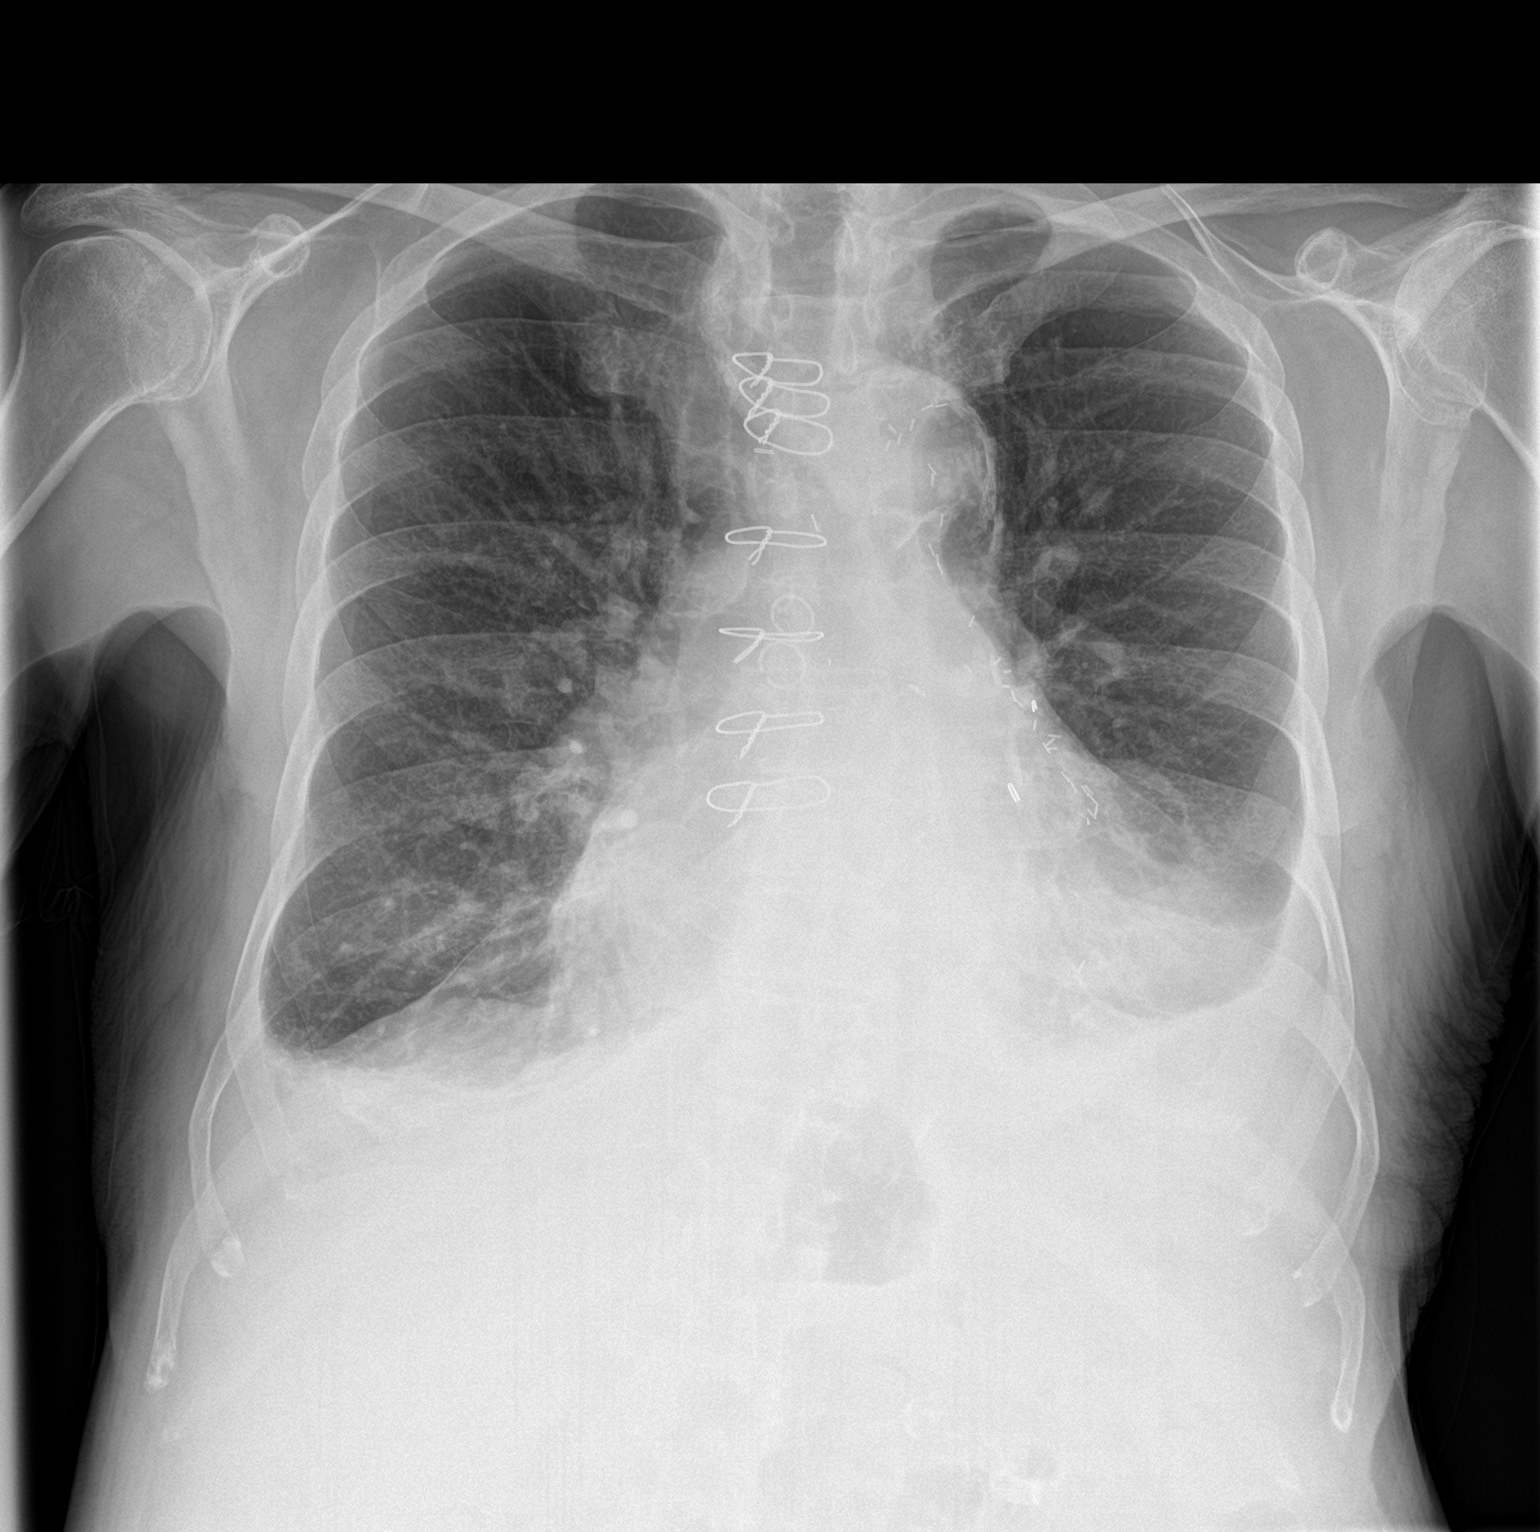
[im 2/2]
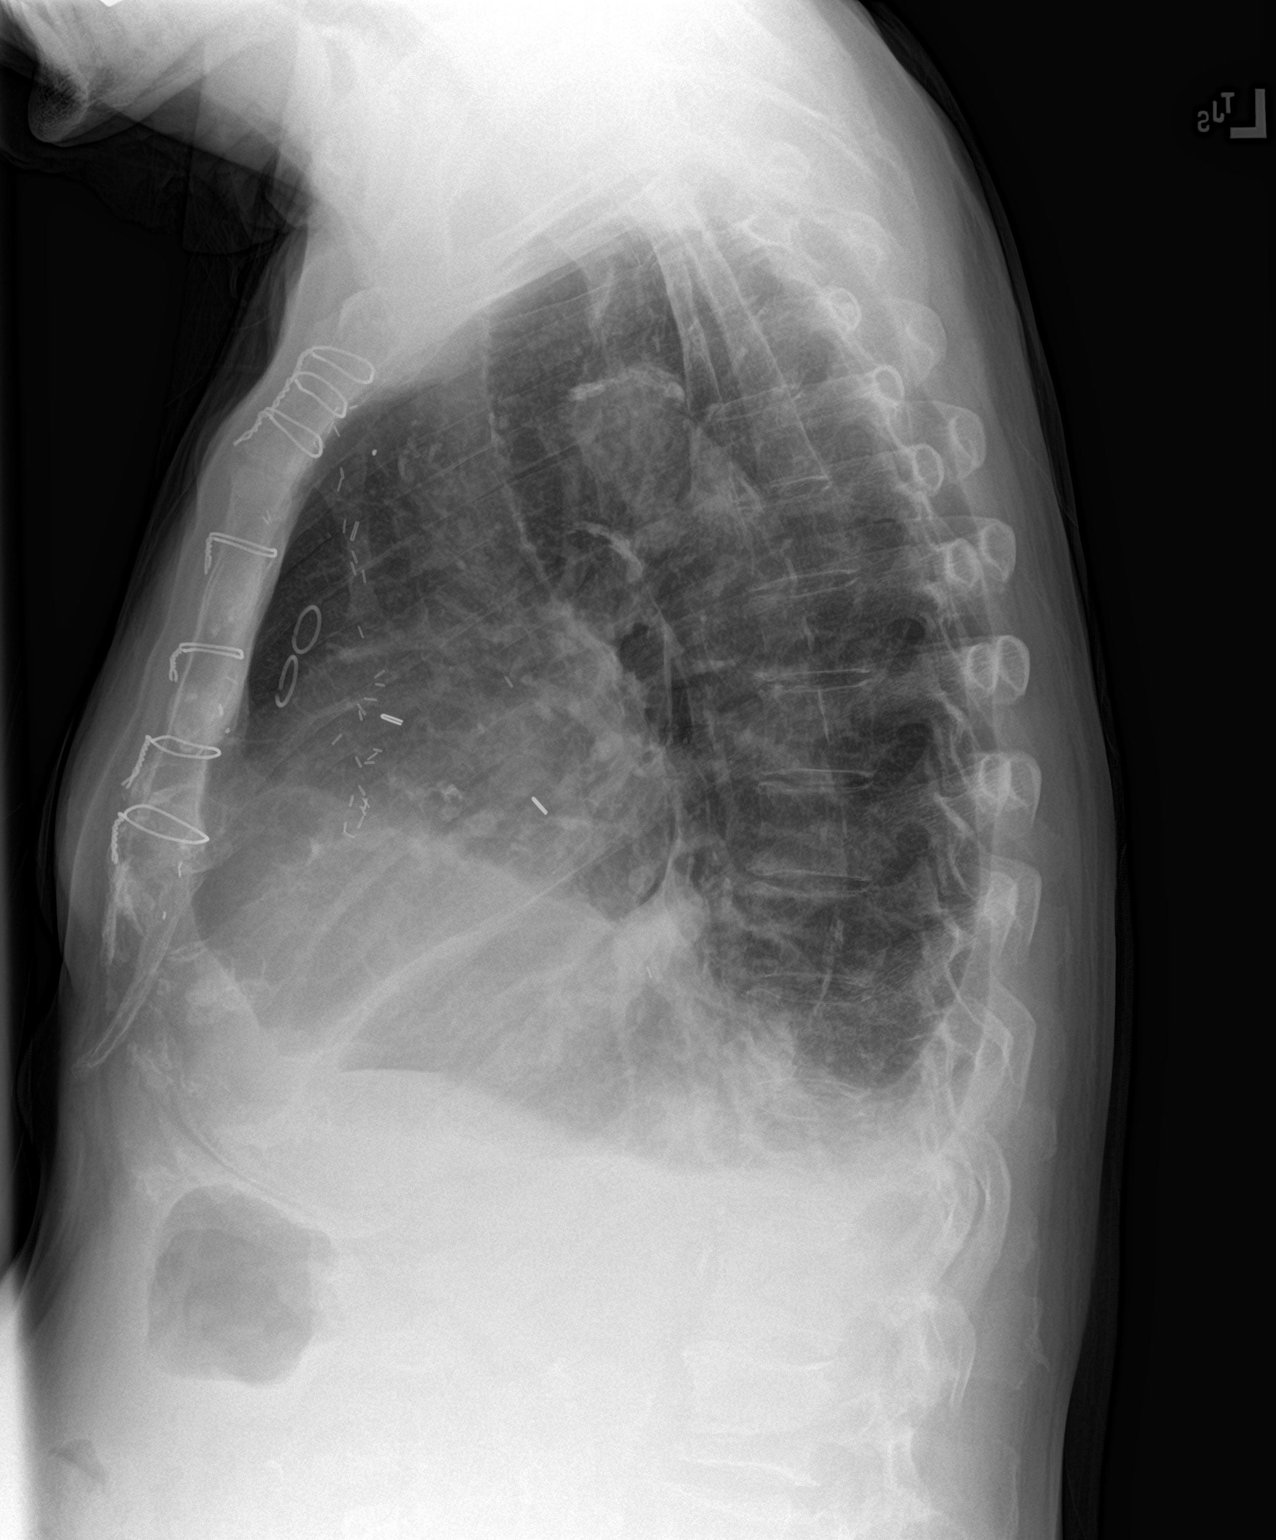

[2 of 2 positions shown; findings below may reference images not displayed]

FINDINGS: Stable cardiomegaly given projection and technique. Aortic
atherosclerosis with calcification. Post median sternotomy and CABG
with wires in alignment. Decreased small right and small to moderate
left pleural effusions as well as bibasilar opacities. Persistent
pulmonary vascular congestion. No acute osseous abnormality is
evident.
IMPRESSION: Decreased small right and small to moderate left pleural effusions
as well as associated basilar atelectasis. Persistent pulmonary
vascular congestion.

By: Nya Jumper M.D.

## 2019-04-29 IMAGING — US US THORACENTESIS ASP PLEURAL SPACE W/IMG GUIDE
1 series · 3 of 3 positions shown · non-contrast
Comparison: none

INDICATION: Left pleural effusion

[Series 1: us thoracentesis asp pleural space w/img guide · 3 of 3 slices shown]
[im 1/3]
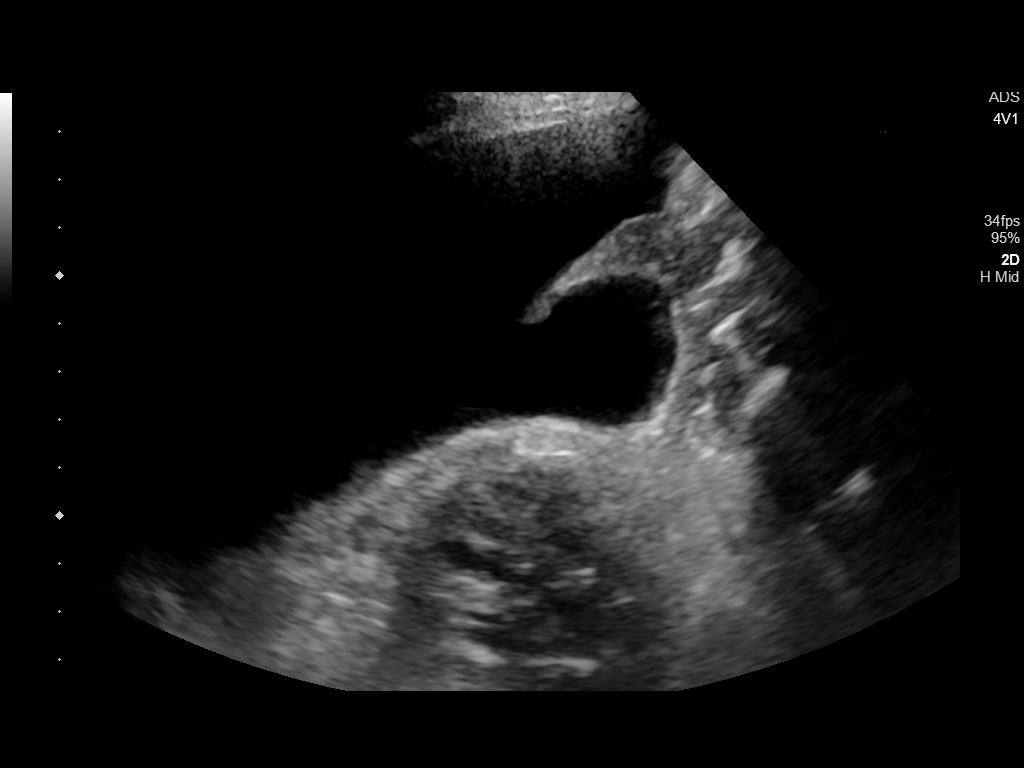
[im 2/3]
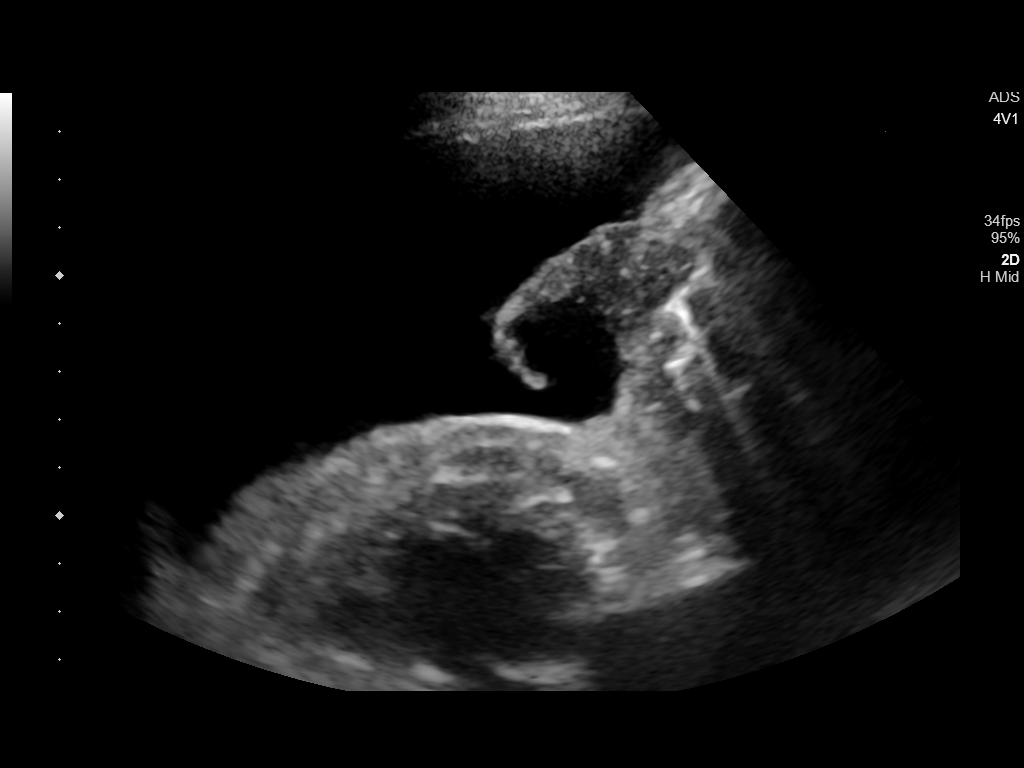
[im 3/3]
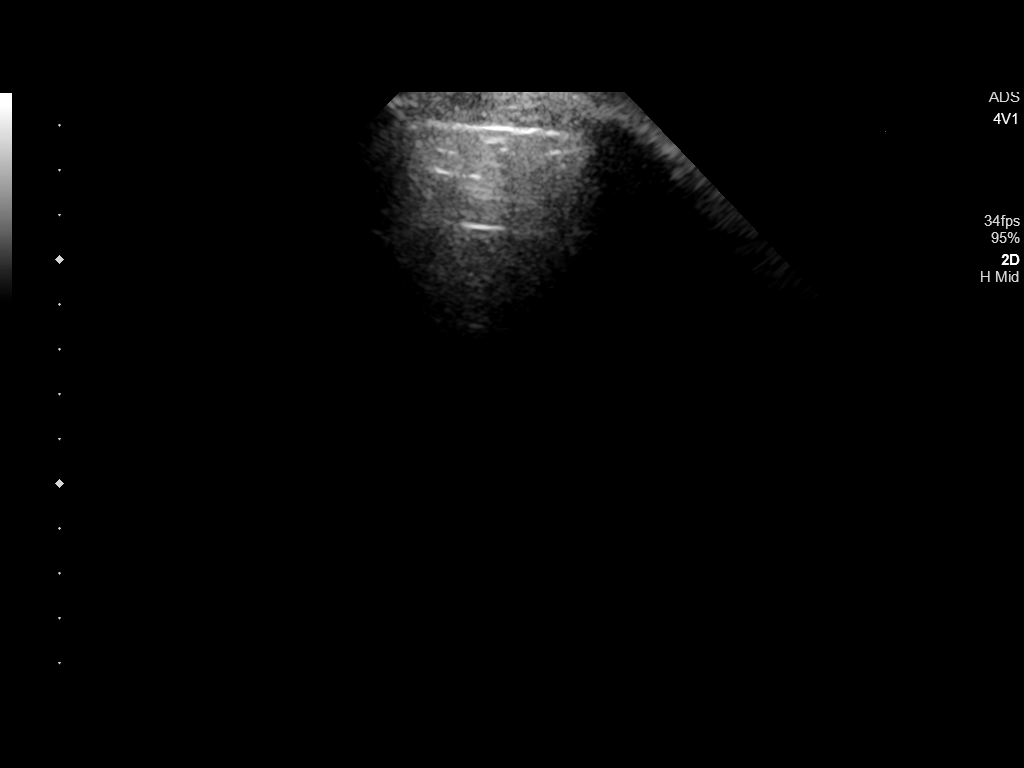

[3 of 3 positions shown; findings below may reference images not displayed]

EXAM:
ULTRASOUND GUIDED LEFT THORACENTESIS

MEDICATIONS:
None.

COMPLICATIONS:
None immediate.

PROCEDURE:
An ultrasound guided thoracentesis was thoroughly discussed with the
patient and questions answered. The benefits, risks, alternatives
and complications were also discussed. The patient understands and
wishes to proceed with the procedure. Written consent was obtained.

Ultrasound was performed to localize and mark an adequate pocket of
fluid in the left chest. The area was then prepped and draped in the
normal sterile fashion. 1% Lidocaine was used for local anesthesia.
Under ultrasound guidance a 6 Fr Safe-T-Centesis catheter was
introduced. Thoracentesis was performed. The catheter was removed
and a dressing applied.
FINDINGS: A total of approximately 900 cc of clear yellow fluid was removed.
IMPRESSION: Successful ultrasound guided left thoracentesis yielding 900 cc of
pleural fluid.

## 2019-04-29 IMAGING — CR DG CHEST 1V PORT
1 series · 1 of 1 positions shown · non-contrast
Comparison: 05/22/2018

CLINICAL DATA: Post left thoracentesis

EXAM:
PORTABLE CHEST 1 VIEW

[x chest ap]
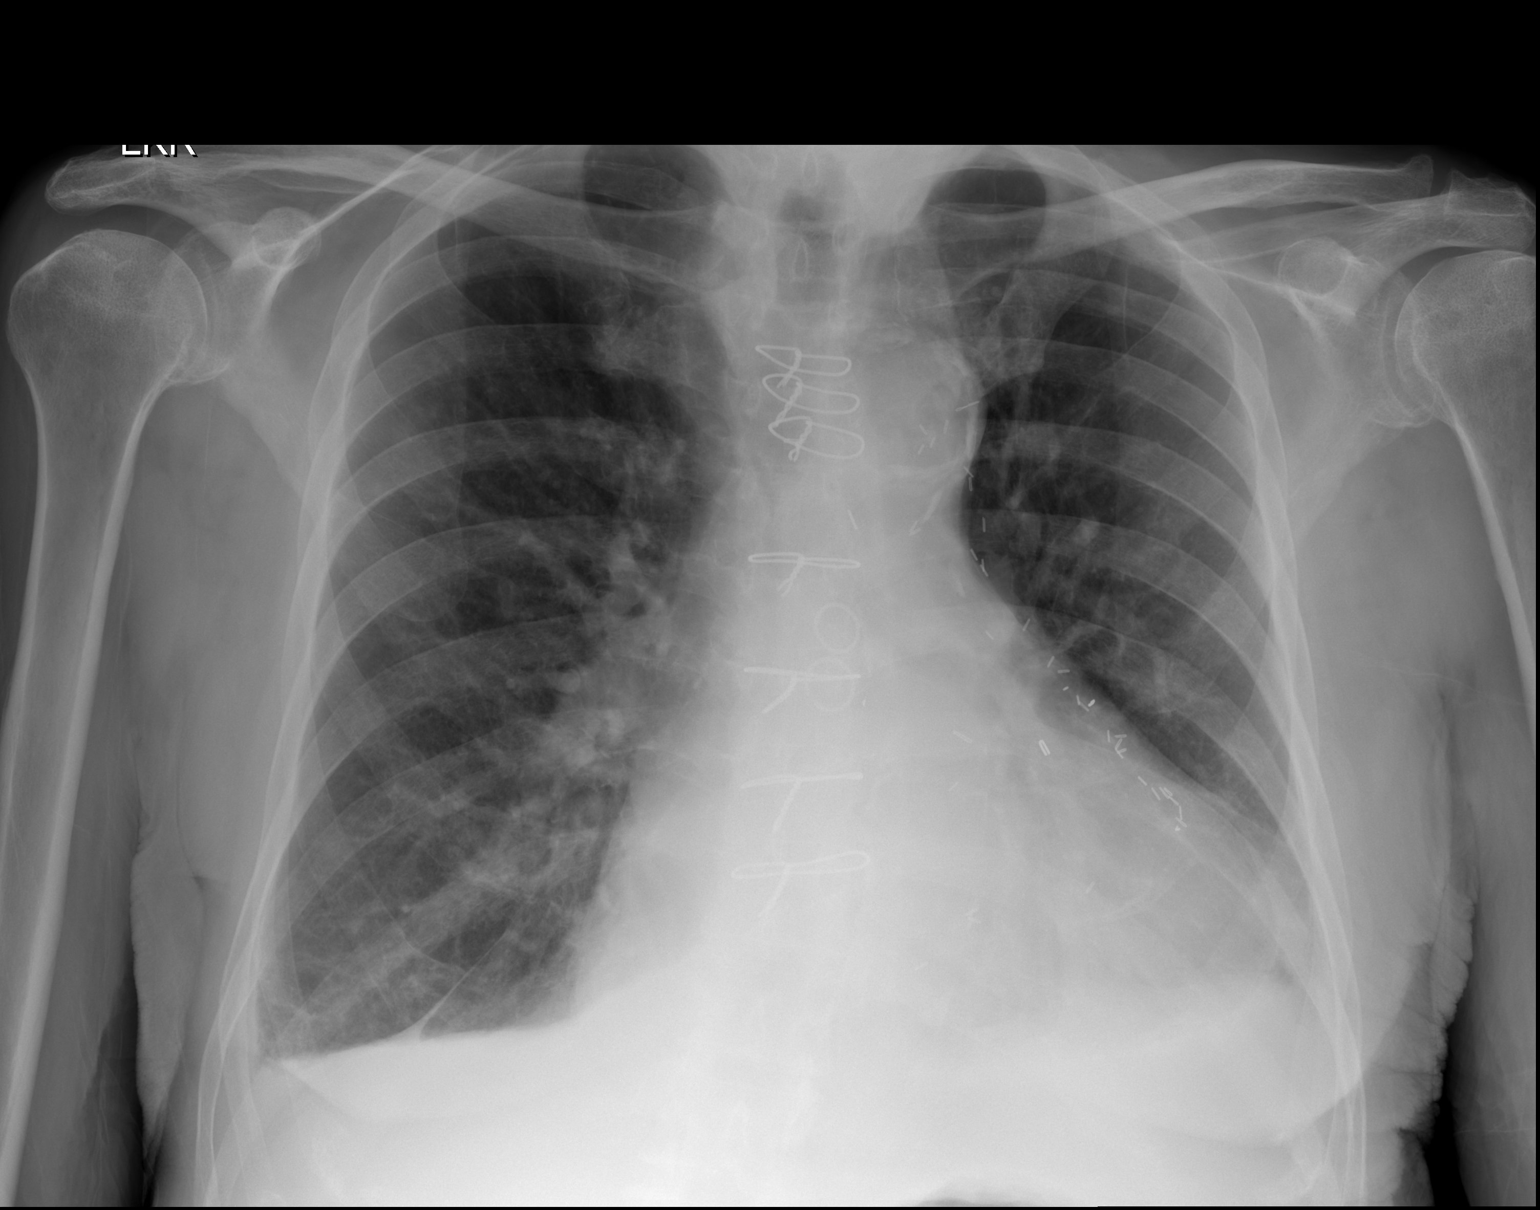

[1 of 1 positions shown; findings below may reference images not displayed]

FINDINGS: There is no pneumothorax after left thoracentesis. Left pleural
effusion has improved. Heart remains enlarged. Small right pleural
effusion is stable. Stable mild vascular congestion.
IMPRESSION: No pneumothorax post left thoracentesis.

## 2019-05-02 IMAGING — US US BIOPSY CORE LIVER
1 series · 11 of 11 positions shown · non-contrast
Comparison: none

INDICATION: Liver metastases, history squamous cell carcinoma of the skin
remotely

[Series 1: us biopsy core liver · 11 acquisitions, 11 frames shown]
[im 1/11]
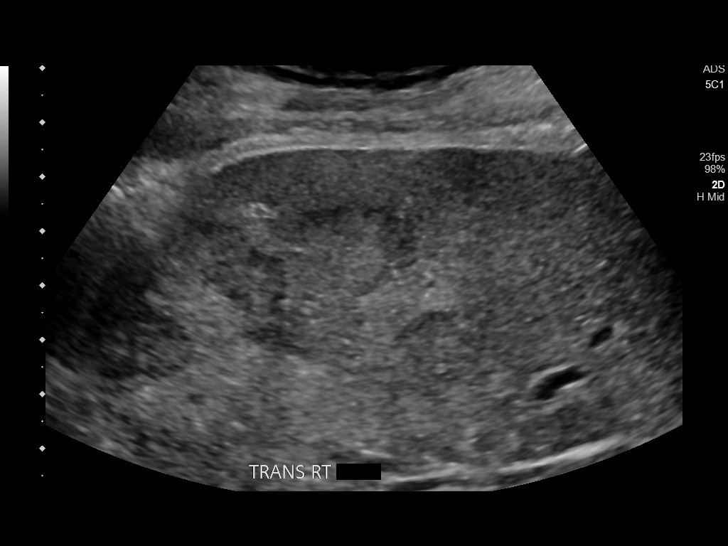
[im 2/11]
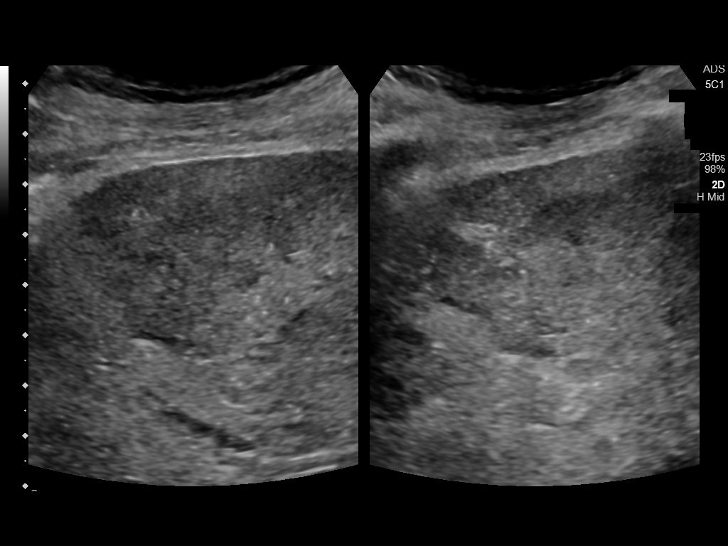
[im 3/11]
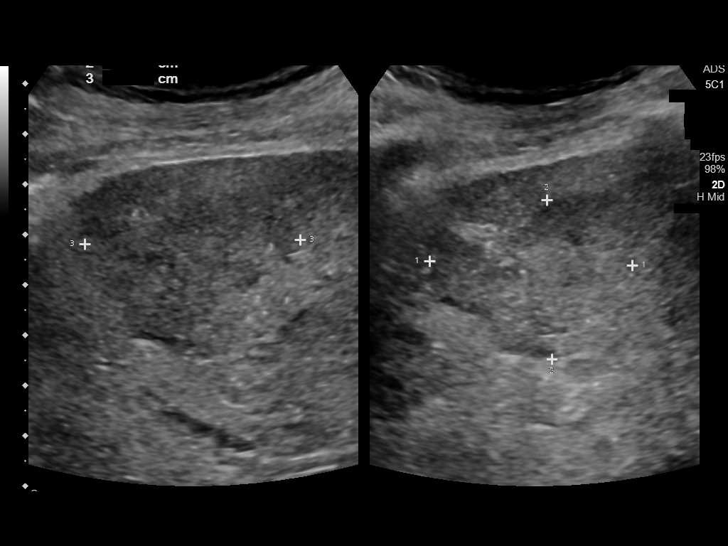
[im 4/11]
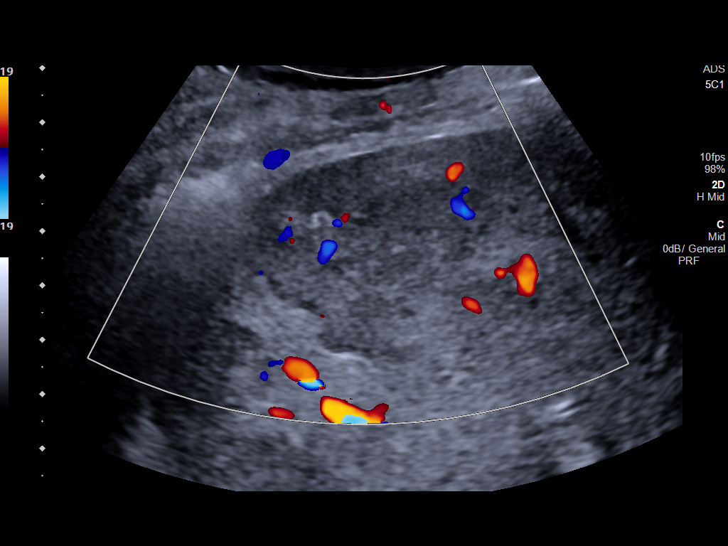
[im 5/11]
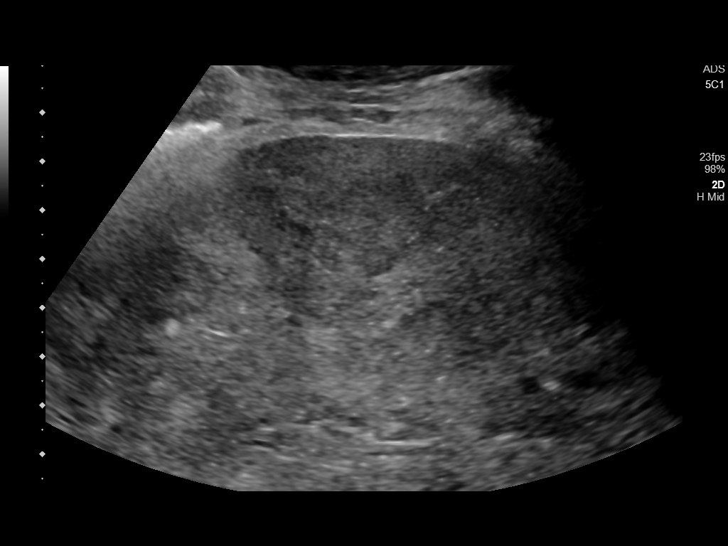
[im 6/11]
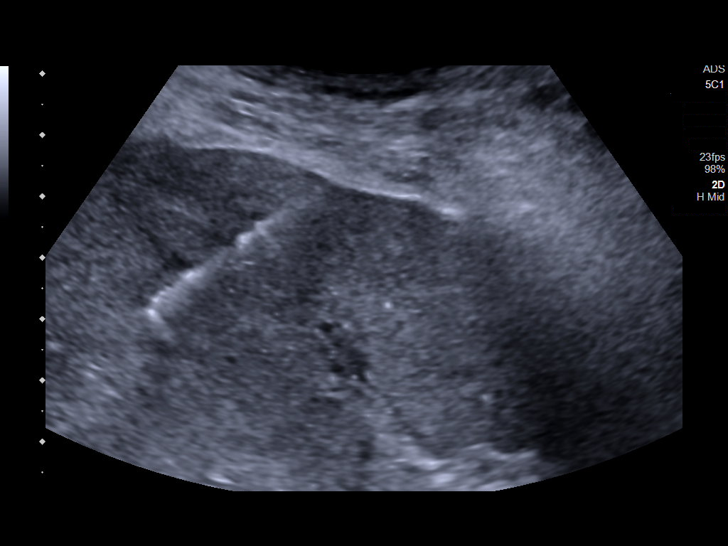
[im 7/11]
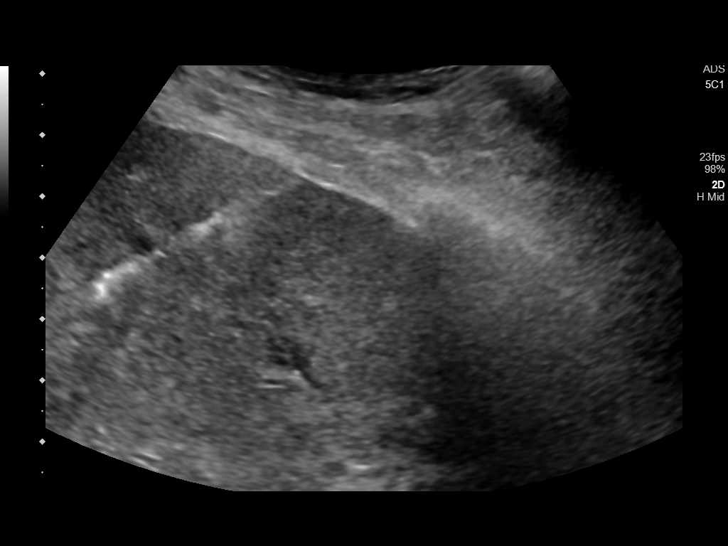
[im 8/11]
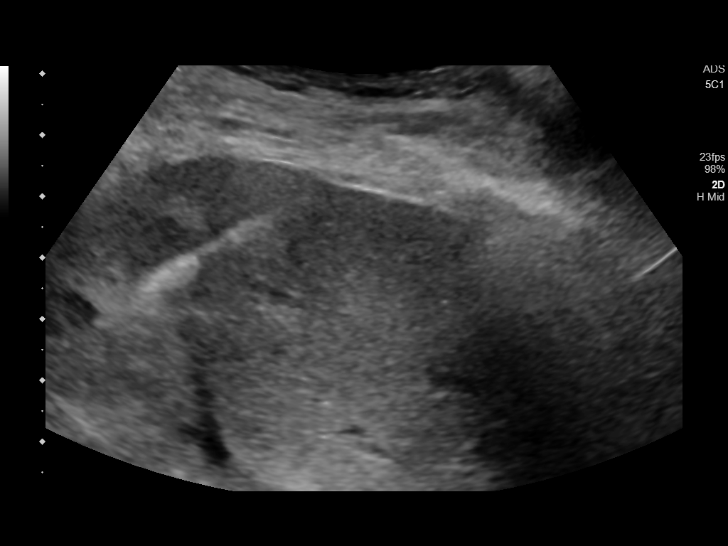
[im 9/11]
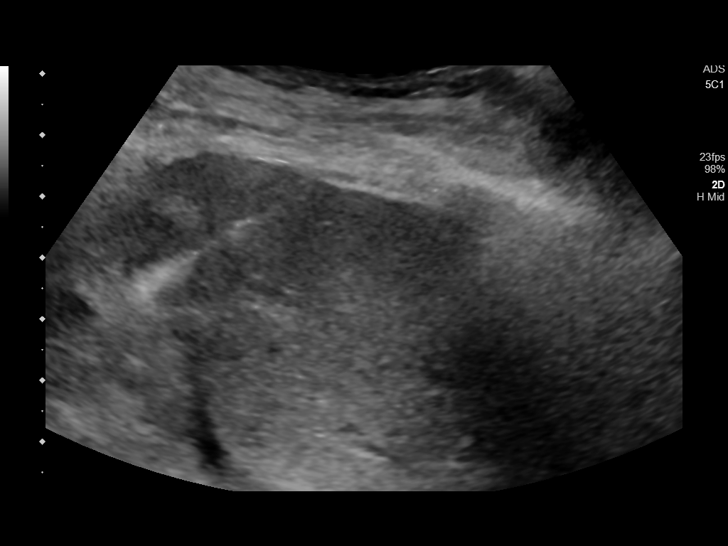
[im 10/11]
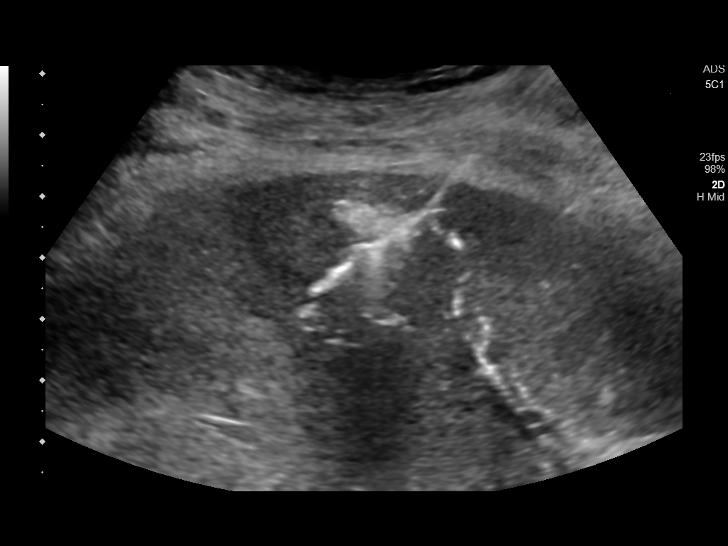
[im 11/11]
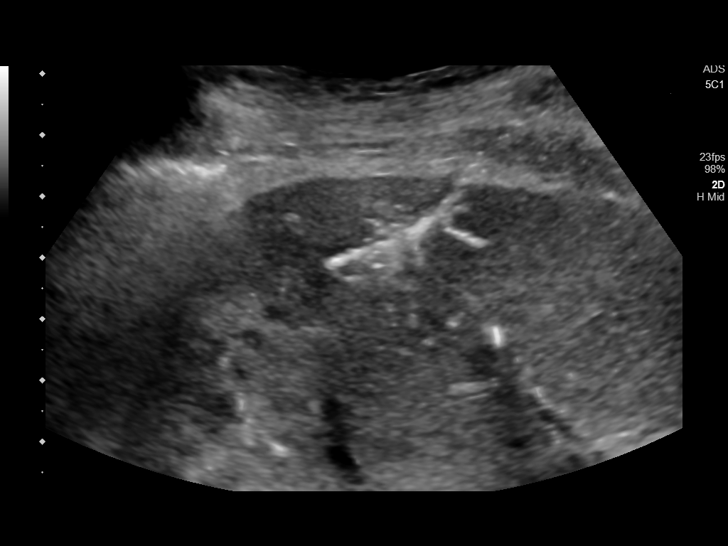

[11 of 11 positions shown; findings below may reference images not displayed]

EXAM:
Ultrasound right liver mass biopsy

MEDICATIONS:
1% lidocaine local

ANESTHESIA/SEDATION:
Moderate (conscious) sedation was employed during this procedure. A
total of Versed 0.5 mg and Fentanyl 25 mcg was administered
intravenously.

Moderate Sedation Time: 8 minutes. The patient's level of
consciousness and vital signs were monitored continuously by
radiology nursing throughout the procedure under my direct
supervision.

FLUOROSCOPY TIME:  Fluoroscopy Time: None.

COMPLICATIONS:
None immediate.

PROCEDURE:
Informed written consent was obtained from the patient after a
thorough discussion of the procedural risks, benefits and
alternatives. All questions were addressed. Maximal Sterile Barrier
Technique was utilized including caps, mask, sterile gowns, sterile
gloves, sterile drape, hand hygiene and skin antiseptic. A timeout
was performed prior to the initiation of the procedure.

Previous imaging reviewed. Preliminary ultrasound performed of the
liver. A right hepatic lesion was localized in the mid axillary line
through a lower intercostal space. Overlying skin marked.

Under sterile conditions and local anesthesia, a 17 gauge 6.8 cm
introducer needle was advanced into the lesion. Needle position
confirmed with ultrasound. 3 18 gauge core biopsies obtained. These
were placed in formalin. Needle tract occluded with Gel-Foam.
Postprocedure imaging demonstrates no hemorrhage or hematoma.
Patient tolerated biopsy well.
IMPRESSION: Successful ultrasound right liver mass 18 gauge core biopsies
